# Patient Record
Sex: Female | Born: 1955 | Race: Black or African American | Hispanic: No | Marital: Single | Smoking: Never smoker
Health system: Southern US, Community
[De-identification: ages and names within clinical notes are randomized; demographics above are authoritative.]

## PROBLEM LIST (undated history)

## (undated) DIAGNOSIS — I809 Phlebitis and thrombophlebitis of unspecified site: Secondary | ICD-10-CM

## (undated) DIAGNOSIS — R519 Headache, unspecified: Secondary | ICD-10-CM

## (undated) DIAGNOSIS — I25119 Atherosclerotic heart disease of native coronary artery with unspecified angina pectoris: Secondary | ICD-10-CM

## (undated) DIAGNOSIS — C55 Malignant neoplasm of uterus, part unspecified: Secondary | ICD-10-CM

## (undated) DIAGNOSIS — I2699 Other pulmonary embolism without acute cor pulmonale: Secondary | ICD-10-CM

## (undated) DIAGNOSIS — I700 Atherosclerosis of aorta: Secondary | ICD-10-CM

## (undated) DIAGNOSIS — Z923 Personal history of irradiation: Secondary | ICD-10-CM

## (undated) DIAGNOSIS — R238 Other skin changes: Secondary | ICD-10-CM

## (undated) DIAGNOSIS — D638 Anemia in other chronic diseases classified elsewhere: Secondary | ICD-10-CM

## (undated) DIAGNOSIS — M7989 Other specified soft tissue disorders: Secondary | ICD-10-CM

## (undated) DIAGNOSIS — R0989 Other specified symptoms and signs involving the circulatory and respiratory systems: Secondary | ICD-10-CM

## (undated) DIAGNOSIS — R2243 Localized swelling, mass and lump, lower limb, bilateral: Secondary | ICD-10-CM

## (undated) DIAGNOSIS — R739 Hyperglycemia, unspecified: Secondary | ICD-10-CM

## (undated) DIAGNOSIS — R51 Headache: Secondary | ICD-10-CM

## (undated) DIAGNOSIS — C801 Malignant (primary) neoplasm, unspecified: Secondary | ICD-10-CM

## (undated) DIAGNOSIS — C7800 Secondary malignant neoplasm of unspecified lung: Secondary | ICD-10-CM

## (undated) DIAGNOSIS — Z6834 Body mass index (BMI) 34.0-34.9, adult: Secondary | ICD-10-CM

## (undated) DIAGNOSIS — D6869 Other thrombophilia: Secondary | ICD-10-CM

## (undated) HISTORY — DX: Secondary malignant neoplasm of unspecified lung: C78.00

## (undated) HISTORY — DX: Malignant neoplasm of uterus, part unspecified: C55

## (undated) HISTORY — DX: Other pulmonary embolism without acute cor pulmonale: I26.99

## (undated) HISTORY — DX: Atherosclerosis of aorta: I70.0

## (undated) HISTORY — DX: Body mass index (BMI) 34.0-34.9, adult: Z68.34

## (undated) HISTORY — DX: Other specified symptoms and signs involving the circulatory and respiratory systems: R09.89

## (undated) HISTORY — DX: Morbid (severe) obesity due to excess calories: E66.01

## (undated) HISTORY — DX: Other specified soft tissue disorders: M79.89

## (undated) HISTORY — DX: Other skin changes: R23.8

## (undated) HISTORY — DX: Anemia in other chronic diseases classified elsewhere: D63.8

## (undated) HISTORY — DX: Atherosclerotic heart disease of native coronary artery with unspecified angina pectoris: I25.119

## (undated) HISTORY — DX: Personal history of irradiation: Z92.3

## (undated) HISTORY — DX: Hyperglycemia, unspecified: R73.9

## (undated) HISTORY — DX: Other thrombophilia: D68.69

## (undated) HISTORY — PX: NO PAST SURGERIES: SHX2092

---

## 2016-10-15 ENCOUNTER — Encounter: Payer: Self-pay | Admitting: Gynecologic Oncology

## 2016-10-19 DIAGNOSIS — C541 Malignant neoplasm of endometrium: Secondary | ICD-10-CM | POA: Insufficient documentation

## 2016-11-02 NOTE — Progress Notes (Signed)
Consult Note: Gyn-Onc  Consult was requested by Dr. Alfonse Spruce for the evaluation of Brianna Herring 60 y.o. female  CC:  Chief Complaint  Patient presents with  . Endometrial adenocarcinoma    Assessment/Plan:  [name]  is a 60 y.o.  year old with high grade endometrial cancer.  We will obtain CT imaging to evaluate for gross extrauterine disease.  A detailed discussion was held with the patient and her family with regard to to her endometrial cancer diagnosis. We discussed the standard management options for uterine cancer which includes surgery followed possibly by adjuvant therapy depending on the results of surgery. The options for surgical management include a hysterectomy and removal of the tubes and ovaries possibly with removal of pelvic and para-aortic lymph nodes.If feasible, a minimally invasive approach including a robotic hysterectomy or laparoscopic hysterectomy have benefits including shorter hospital stay, recovery time and better wound healing than with open surgery. The patient has been counseled about these surgical options and the risks of surgery in general including infection, bleeding, damage to surrounding structures (including bowel, bladder, ureters, nerves or vessels), and the postoperative risks of PE/ DVT, and lymphedema. I extensively reviewed the additional risks of robotic hysterectomy including possible need for conversion to open laparotomy.  I discussed positioning during surgery of trendelenberg and risks of minor facial swelling and care we take in preoperative positioning. I discussed that due to her obesity she is at increased risk for these complications. After counseling and consideration of her options, she desires to proceed with robotic assisted total hysterectomy with bilateral sapingo-oophorectomy and SLN biopsy. Due to her nulliparous history, she may require minilaparotomy for specimen delivery.  Due to the high grade nature of her  malignancy she will have a preoperative CT scan to evaluate for extrauterine metastases in accordance to NCCN guidelines.  She will be seen by anesthesia for preoperative clearance and discussion of postoperative pain management.  She was given the opportunity to ask questions, which were answered to her satisfaction, and she is agreement with the above mentioned plan of care.   HPI: Brianna Herring is a 60 year old G0 who is seen in consultation at the request of Dr Alfonse Spruce for high grade endometrial cancer.  She is morbidly obese with a BMI of 44kg/m2.  She developed postmenopausal bleeding in September, 2017 and was evaluated by Dr Alfonse Spruce in November, 2017 when a TVUS was performed (09/13/16) which showed a uterus measuring 6.6x4.7x3.1cm with a thickened ES of 19mm.  Due to her narrow vagina and obesity, office sampling was unsuccessful.  She was taken to the OR on 10/06/16 by Dr Alfonse Spruce for a D&C with the pathology revealing high grade endometrial cancer (grade 3).  Current Meds:  Outpatient Encounter Prescriptions as of 11/03/2016  Medication Sig  . benzonatate (TESSALON) 200 MG capsule   . calcium carbonate (CALTRATE 600) 1500 (600 Ca) MG TABS tablet Take by mouth.  . Multiple Vitamins-Minerals (CENTRUM SILVER PO) Take by mouth.   No facility-administered encounter medications on file as of 11/03/2016.     Allergy: Not on File  Social Hx:   Social History   Social History  . Marital status: Unknown    Spouse name: N/A  . Number of children: N/A  . Years of education: N/A   Occupational History  . Not on file.   Social History Main Topics  . Smoking status: Never Smoker  . Smokeless tobacco: Never Used  . Alcohol use No  . Drug use:  No  . Sexual activity: Not on file   Other Topics Concern  . Not on file   Social History Narrative  . No narrative on file    Past Surgical Hx: History reviewed. No pertinent surgical history.  Past Medical Hx: History reviewed.  No pertinent past medical history.  Past Gynecological History:  G0 No LMP recorded.  Family Hx: History reviewed. No pertinent family history.  Review of Systems:  Constitutional  Feels well,    ENT Normal appearing ears and nares bilaterally Skin/Breast  No rash, sores, jaundice, itching, dryness Cardiovascular  No chest pain, shortness of breath, or edema  Pulmonary  No cough or wheeze.  Gastro Intestinal  No nausea, vomitting, or diarrhoea. No bright red blood per rectum, no abdominal pain, change in bowel movement, or constipation.  Genito Urinary  No frequency, urgency, dysuria, + postmenopausal bleeding Musculo Skeletal  No myalgia, arthralgia, joint swelling or pain  Neurologic  No weakness, numbness, change in gait,  Psychology  No depression, anxiety, insomnia.   Vitals:  Blood pressure (!) 157/61, pulse (!) 103, temperature 97.7 F (36.5 C), temperature source Oral, resp. rate (!) 21, SpO2 99 %.  Physical Exam: WD in NAD Neck  Supple NROM, without any enlargements.  Lymph Node Survey No cervical supraclavicular or inguinal adenopathy Cardiovascular  Pulse normal rate, regularity and rhythm. S1 and S2 normal.  Lungs  Clear to auscultation bilateraly, without wheezes/crackles/rhonchi. Good air movement.  Skin  No rash/lesions/breakdown  Psychiatry  Alert and oriented to person, place, and time  Abdomen  Normoactive bowel sounds, abdomen soft, non-tender and obese without evidence of hernia.  Back No CVA tenderness Genito Urinary  Vulva/vagina: Normal external female genitalia.   No lesions. No discharge or bleeding.  Bladder/urethra:  No lesions or masses, well supported bladder  Vagina: long, narrow  Cervix: unable to visualize or palpate due to long, narrow vagina  Uterus: unable to appreciate due to long narrow vagina and obesity.  Adnexa: unable to apreciate due to obesity.   Rectal  Good tone, no masses no cul de sac nodularity.  Extremities   No bilateral cyanosis, clubbing or edema.   Donaciano [name], MD  11/03/2016, 10:03 AM

## 2016-11-03 ENCOUNTER — Ambulatory Visit: Payer: BLUE CROSS/BLUE SHIELD | Attending: Gynecologic Oncology | Admitting: Gynecologic Oncology

## 2016-11-03 ENCOUNTER — Telehealth: Payer: Self-pay | Admitting: Gynecologic Oncology

## 2016-11-03 ENCOUNTER — Other Ambulatory Visit (HOSPITAL_BASED_OUTPATIENT_CLINIC_OR_DEPARTMENT_OTHER): Payer: BLUE CROSS/BLUE SHIELD

## 2016-11-03 ENCOUNTER — Encounter: Payer: Self-pay | Admitting: Gynecologic Oncology

## 2016-11-03 VITALS — BP 157/61 | HR 103 | Temp 97.7°F | Resp 21 | Ht 66.0 in | Wt 257.3 lb

## 2016-11-03 DIAGNOSIS — C541 Malignant neoplasm of endometrium: Secondary | ICD-10-CM | POA: Diagnosis not present

## 2016-11-03 DIAGNOSIS — Z6841 Body Mass Index (BMI) 40.0 and over, adult: Secondary | ICD-10-CM | POA: Diagnosis not present

## 2016-11-03 HISTORY — DX: Malignant neoplasm of endometrium: C54.1

## 2016-11-03 LAB — COMPREHENSIVE METABOLIC PANEL
ALT: 17 U/L (ref 0–55)
ANION GAP: 9 meq/L (ref 3–11)
AST: 24 U/L (ref 5–34)
Albumin: 3.5 g/dL (ref 3.5–5.0)
Alkaline Phosphatase: 78 U/L (ref 40–150)
BILIRUBIN TOTAL: 0.62 mg/dL (ref 0.20–1.20)
BUN: 11.6 mg/dL (ref 7.0–26.0)
CALCIUM: 9.6 mg/dL (ref 8.4–10.4)
CHLORIDE: 105 meq/L (ref 98–109)
CO2: 26 meq/L (ref 22–29)
CREATININE: 0.8 mg/dL (ref 0.6–1.1)
EGFR: 85 mL/min/{1.73_m2} — AB (ref >90)
Glucose: 134 mg/dl (ref 70–140)
Potassium: 3.8 mEq/L (ref 3.5–5.1)
Sodium: 139 mEq/L (ref 136–145)
TOTAL PROTEIN: 7.8 g/dL (ref 6.4–8.3)

## 2016-11-03 NOTE — Addendum Note (Signed)
Addended by: Joylene John D on: 11/03/2016 10:14 AM   Modules accepted: Orders

## 2016-11-03 NOTE — Patient Instructions (Signed)
Preparing for your Surgery  Plan for surgery on January 11 , 2018 with Dr. Everitt Amber  Pre-operative Testing -You will receive a phone call from presurgical testing at [facility] to arrange for a pre-operative testing appointment before your surgery.  This appointment normally occurs one to two weeks before your scheduled surgery.   -Bring your insurance card, copy of an advanced directive if applicable, medication list  -At that visit, you will be asked to sign a consent for a possible blood transfusion in case a transfusion becomes necessary during surgery.  The need for a blood transfusion is rare but having consent is a necessary part of your care.     -You should not be taking blood thinners or aspirin at least ten days prior to surgery unless instructed by your surgeon.  Day Before Surgery at [place] will be asked to take in a light diet the day before surgery.  Avoid carbonated beverages.  You will be advised to have nothing to eat or drink after midnight the evening before.     Eat a light diet the day before surgery.  Examples including soups, broths,  toast, yogurt, mashed potatoes.  Things to avoid include carbonated beverages  (fizzy beverages), raw fruits and raw vegetables, or beans.    If your bowels are filled with gas, your surgeon will have difficulty  visualizing your pelvic organs which increases your surgical risks.  Your role in recovery Your role is to become active as soon as directed by your doctor, while still giving yourself time to heal.  Rest when you feel tired. You will be asked to do the following in order to speed your recovery:  - Cough and breathe deeply. This helps toclear and expand your lungs and can prevent pneumonia. You may be given a spirometer to practice deep breathing. A staff member will show you how to use the spirometer. - Do mild physical activity. Walking or moving your legs help your circulation and body functions  return to normal. A staff member will help you when you try to walk and will provide you with simple exercises. Do not try to get up or walk alone the first time. - Actively manage your pain. Managing your pain lets you move in comfort. We will ask you to rate your pain on a scale of zero to 10. It is your responsibility to tell your doctor or nurse where and how much you hurt so your pain can be treated.  Special Considerations -If you are diabetic, you may be placed on insulin after surgery to have closer control over your blood sugars to promote healing and recovery.  This does not mean that you will be discharged on insulin.  If applicable, your oral antidiabetics will be resumed when you are tolerating a solid diet.  -Your final pathology results from surgery should be available by the Friday after surgery and the results will be relayed to you when available.

## 2016-11-03 NOTE — Telephone Encounter (Signed)
Called to perform peer to peer for CT chest but insurance representative stating the CT CAP has already been approved.  Auth # [id number] valid Dec 27-Jan 25.

## 2016-11-11 NOTE — Patient Instructions (Signed)
Brianna Herring  11/11/2016   Your procedure is scheduled on: THURSDAY 11-18-16  Report [facility]  Entrance take [facility]  elevators to 3rd floor to  [name] at 1115 AM.  Call this number if you have problems the morning of surgery [phone]   Remember: ONLY 1 PERSON MAY GO WITH YOU TO SHORT STAY TO GET  READY MORNING OF [name].  Do not eat food  NIGHT BEFORE SURGERY :After Midnight, MAY HAVE CLEAR LIQUIDS FROM MIDNIGHT NIGHT BEFORE SURGERY UNTIL 715 AM DAY OF SURGERY, NOTHING BY MOUTH AFTER 715 AM DAY OF SURGERY. EAT A LIGHT DIET DAY BEFORE SURGERY 11-17-16 SEE INSTRUCTIONS BELOW.     Take these medicines the morning of surgery with A SIP OF WATER: NONE                               You may not have any metal on your body including hair pins and              piercings  Do not wear jewelry, make-up, lotions, powders or perfumes, deodorant             Do not wear nail polish.  Do not shave  48 hours prior to surgery.              Men may shave face and neck.   Do not bring valuables to the hospital. [place].  Contacts, dentures or bridgework may not be worn into surgery.  Leave suitcase in the car. After surgery it may be brought to your room.                  Please read over the following fact sheets you were given: _____________________________________________________________________             Eat a light diet the day before surgery.  Examples including soups, broths, toast, yogurt, mashed potatoes.  Things to avoid include carbonated beverages (fizzy beverages), raw fruits and raw vegetables, or beans.   If your bowels are filled with gas, your surgeon will have difficulty visualizing your pelvic organs which increases your surgical risks.   CLEAR LIQUID DIET   Foods Allowed                                                                     Foods Excluded  Coffee and tea,  regular and decaf                             liquids that you cannot  Plain Jell-O in any flavor                                             see through such as: Fruit ices (not with fruit pulp)  milk, soups, orange juice  Iced Popsicles                                    All solid food                         Cranberry, grape and apple juices Sports drinks like Gatorade Lightly seasoned clear broth or consume(fat free) Sugar, honey syrup  Sample Menu Breakfast                                Lunch                                     Supper Cranberry juice                    Beef broth                            Chicken broth Jell-O                                     Grape juice                           Apple juice Coffee or tea                        Jell-O                                      Popsicle                                                Coffee or tea                        Coffee or tea  _____________________________________________________________________  [facility] - Preparing for Surgery Before surgery, you can play an important role.  Because skin is not sterile, your skin needs to be as free of germs as possible.  You can reduce the number of germs on your skin by washing with CHG (chlorahexidine gluconate) soap before surgery.  CHG is an antiseptic cleaner which kills germs and bonds with the skin to continue killing germs even after washing. Please DO NOT use if you have an allergy to CHG or antibacterial soaps.  If your skin becomes reddened/irritated stop using the CHG and inform your nurse when you arrive at Short Stay. Do not shave (including legs and underarms) for at least 48 hours prior to the first CHG shower.  You may shave your face/neck. Please follow these instructions carefully:  1.  Shower with CHG Soap the night before surgery and the  morning of Surgery.  2.  If you choose to wash your hair, wash your hair first as  usual with your  normal  shampoo.  3.  After you shampoo, rinse your hair and body thoroughly to remove the  shampoo.                           4.  Use CHG as you would any other liquid soap.  You can apply chg directly  to the skin and wash                       Gently with a scrungie or clean washcloth.  5.  Apply the CHG Soap to your body ONLY FROM THE NECK DOWN.   Do not use on face/ open                           Wound or open sores. Avoid contact with eyes, ears mouth and genitals (private parts).                       Wash face,  Genitals (private parts) with your normal soap.             6.  Wash thoroughly, paying special attention to the area where your surgery  will be performed.  7.  Thoroughly rinse your body with warm water from the neck down.  8.  DO NOT shower/wash with your normal soap after using and rinsing off  the CHG Soap.                9.  Pat yourself dry with a clean towel.            10.  Wear clean pajamas.            11.  Place clean sheets on your bed the night of your first shower and do not  sleep with pets. Day of Surgery : Do not apply any lotions/deodorants the morning of surgery.  Please wear clean clothes to the hospital/surgery center.  FAILURE TO FOLLOW THESE INSTRUCTIONS MAY RESULT IN THE CANCELLATION OF YOUR SURGERY PATIENT SIGNATURE_________________________________  NURSE SIGNATURE__________________________________  ________________________________________________________________________   [name]  An incentive spirometer is a tool that can help keep your lungs clear and active. This tool measures how well you are filling your lungs with each breath. Taking long deep breaths may help reverse or decrease the chance of developing breathing (pulmonary) problems (especially infection) following:  A long period of time when you are unable to move or be active. BEFORE THE PROCEDURE   If the spirometer includes an indicator to show your  best effort, your nurse or respiratory therapist will set it to a desired goal.  If possible, sit up straight or lean slightly forward. Try not to slouch.  Hold the incentive spirometer in an upright position. INSTRUCTIONS FOR USE  1. Sit on the edge of your bed if possible, or sit up as far as you can in bed or on a chair. 2. Hold the incentive spirometer in an upright position. 3. Breathe out normally. 4. Place the mouthpiece in your mouth and seal your lips tightly around it. 5. Breathe in slowly and as deeply as possible, raising the piston or the ball toward the top of the column. 6. Hold your breath for 3-5 seconds or for as long as possible. Allow the piston or ball to fall to the bottom of the column. 7. Remove the mouthpiece from your mouth and breathe out normally. 8. Rest  for a few seconds and repeat Steps 1 through 7 at least 10 times every 1-2 hours when you are awake. Take your time and take a few normal breaths between deep breaths. 9. The spirometer may include an indicator to show your best effort. Use the indicator as a goal to work toward during each repetition. 10. After each set of 10 deep breaths, practice coughing to be sure your lungs are clear. If you have an incision (the cut made at the time of surgery), support your incision when coughing by placing a pillow or rolled up towels firmly against it. Once you are able to get out of bed, walk around indoors and cough well. You may stop using the incentive spirometer when instructed by your caregiver.  RISKS AND COMPLICATIONS  Take your time so you do not get dizzy or light-headed.  If you are in pain, you may need to take or ask for pain medication before doing incentive spirometry. It is harder to take a deep breath if you are having pain. AFTER USE  Rest and breathe slowly and easily.  It can be helpful to keep track of a log of your progress. Your caregiver can provide you with a simple table to help with this. If  you are using the spirometer at home, follow these instructions: [place] IF:   You are having difficultly using the spirometer.  You have trouble using the spirometer as often as instructed.  Your pain medication is not giving enough relief while using the spirometer.  You develop fever of 100.5 F (38.1 C) or higher. SEEK IMMEDIATE MEDICAL CARE IF:   You cough up bloody sputum that had not been present before.  You develop fever of 102 F (38.9 C) or greater.  You develop worsening pain at or near the incision site. MAKE SURE YOU:   Understand these instructions.  Will watch your condition.  Will get help right away if you are not doing well or get worse. Document Released: 03/07/2007 Document Revised: 01/17/2012 Document Reviewed: 05/08/2007 ExitCare Patient Information 2014 Exit[employer], [place].   ________________________________________________________________________  WHAT IS A BLOOD TRANSFUSION? Blood Transfusion Information  A transfusion is the replacement of blood or some of its parts. Blood is made up of multiple cells which provide different functions.  Red blood cells carry oxygen and are used for blood loss replacement.  White blood cells fight against infection.  Platelets control bleeding.  Plasma helps clot blood.  Other blood products are available for specialized needs, such as hemophilia or other clotting disorders. BEFORE THE TRANSFUSION  Who gives blood for transfusions?   Healthy volunteers who are fully evaluated to make sure their blood is safe. This is blood bank blood. Transfusion therapy is the safest it has ever been in the practice of medicine. Before blood is taken from a donor, a complete history is taken to make sure that person has no history of diseases nor engages in risky social behavior (examples are intravenous drug use or sexual activity with multiple partners). The donor's travel history is screened to minimize risk of  transmitting infections, such as malaria. The donated blood is tested for signs of infectious diseases, such as HIV and hepatitis. The blood is then tested to be sure it is compatible with you in order to minimize the chance of a transfusion reaction. If you or a relative donates blood, this is often done in anticipation of surgery and is not appropriate for emergency situations. It takes many  days to process the donated blood. RISKS AND COMPLICATIONS Although transfusion therapy is very safe and saves many lives, the main dangers of transfusion include:   Getting an infectious disease.  Developing a transfusion reaction. This is an allergic reaction to something in the blood you were given. Every precaution is taken to prevent this. The decision to have a blood transfusion has been considered carefully by your caregiver before blood is given. Blood is not given unless the benefits outweigh the risks. AFTER THE TRANSFUSION  Right after receiving a blood transfusion, you will usually feel much better and more energetic. This is especially true if your red blood cells have gotten low (anemic). The transfusion raises the level of the red blood cells which carry oxygen, and this usually causes an energy increase.  The nurse administering the transfusion will monitor you carefully for complications. HOME CARE INSTRUCTIONS  No special instructions are needed after a transfusion. You may find your energy is better. Speak with your caregiver about any limitations on activity for underlying diseases you may have. SEEK MEDICAL CARE IF:   Your condition is not improving after your transfusion.  You develop redness or irritation at the intravenous (IV) site. SEEK IMMEDIATE MEDICAL CARE IF:  Any of the following symptoms occur over the next 12 hours:  Shaking chills.  You have a temperature by mouth above 102 F (38.9 C), not controlled by medicine.  Chest, back, or muscle pain.  People around you  feel you are not acting correctly or are confused.  Shortness of breath or difficulty breathing.  Dizziness and fainting.  You get a rash or develop hives.  You have a decrease in urine output.  Your urine turns a dark color or changes to pink, red, or brown. Any of the following symptoms occur over the next 10 days:  You have a temperature by mouth above 102 F (38.9 C), not controlled by medicine.  Shortness of breath.  Weakness after normal activity.  The white part of the eye turns yellow (jaundice).  You have a decrease in the amount of urine or are urinating less often.  Your urine turns a dark color or changes to pink, red, or brown. Document Released: 10/22/2000 Document Revised: 01/17/2012 Document Reviewed: 06/10/2008 [facility] Patient Information 2014 [address], [place].  _______________________________________________________________________

## 2016-11-15 ENCOUNTER — Ambulatory Visit (HOSPITAL_COMMUNITY)
Admission: RE | Admit: 2016-11-15 | Discharge: 2016-11-15 | Disposition: A | Discharge status: Home / Self Care | Payer: BLUE CROSS/BLUE SHIELD | Source: Ambulatory Visit | Attending: Gynecologic Oncology | Admitting: Gynecologic Oncology

## 2016-11-15 ENCOUNTER — Encounter (HOSPITAL_COMMUNITY)
Admission: RE | Admit: 2016-11-15 | Discharge: 2016-11-15 | Disposition: A | Discharge status: Home / Self Care | Payer: BLUE CROSS/BLUE SHIELD | Source: Ambulatory Visit | Attending: Gynecologic Oncology | Admitting: Gynecologic Oncology

## 2016-11-15 ENCOUNTER — Encounter (HOSPITAL_COMMUNITY): Payer: Self-pay | Admitting: Radiology

## 2016-11-15 DIAGNOSIS — R918 Other nonspecific abnormal finding of lung field: Secondary | ICD-10-CM | POA: Diagnosis not present

## 2016-11-15 DIAGNOSIS — Z01812 Encounter for preprocedural laboratory examination: Secondary | ICD-10-CM | POA: Insufficient documentation

## 2016-11-15 DIAGNOSIS — Z01818 Encounter for other preprocedural examination: Secondary | ICD-10-CM | POA: Insufficient documentation

## 2016-11-15 DIAGNOSIS — C541 Malignant neoplasm of endometrium: Secondary | ICD-10-CM | POA: Diagnosis not present

## 2016-11-15 HISTORY — DX: Phlebitis and thrombophlebitis of unspecified site: I80.9

## 2016-11-15 HISTORY — DX: Localized swelling, mass and lump, lower limb, bilateral: R22.43

## 2016-11-15 HISTORY — DX: Headache, unspecified: R51.9

## 2016-11-15 HISTORY — DX: Malignant (primary) neoplasm, unspecified: C80.1

## 2016-11-15 HISTORY — DX: Headache: R51

## 2016-11-15 LAB — URINALYSIS, ROUTINE W REFLEX MICROSCOPIC
Bilirubin Urine: NEGATIVE
Glucose, UA: NEGATIVE mg/dL
HGB URINE DIPSTICK: NEGATIVE
Ketones, ur: NEGATIVE mg/dL
Leukocytes, UA: NEGATIVE
Nitrite: NEGATIVE
PH: 8 (ref 5.0–8.0)
Protein, ur: NEGATIVE mg/dL
SPECIFIC GRAVITY, URINE: 1.026 (ref 1.005–1.030)

## 2016-11-15 LAB — CBC WITH DIFFERENTIAL/PLATELET
Basophils Absolute: 0 10*3/uL (ref 0.0–0.1)
Basophils Relative: 0 %
EOS PCT: 3 %
Eosinophils Absolute: 0.2 10*3/uL (ref 0.0–0.7)
HCT: 38.2 % (ref 36.0–46.0)
HEMOGLOBIN: 12.7 g/dL (ref 12.0–15.0)
LYMPHS ABS: 2.3 10*3/uL (ref 0.7–4.0)
LYMPHS PCT: 32 %
MCH: 30.8 pg (ref 26.0–34.0)
MCHC: 33.2 g/dL (ref 30.0–36.0)
MCV: 92.5 fL (ref 78.0–100.0)
MONOS PCT: 7 %
Monocytes Absolute: 0.5 10*3/uL (ref 0.1–1.0)
NEUTROS PCT: 58 %
Neutro Abs: 4.3 10*3/uL (ref 1.7–7.7)
Platelets: 257 10*3/uL (ref 150–400)
RBC: 4.13 MIL/uL (ref 3.87–5.11)
RDW: 12.8 % (ref 11.5–15.5)
WBC: 7.3 10*3/uL (ref 4.0–10.5)

## 2016-11-15 LAB — ABO/RH: ABO/RH(D): O POS

## 2016-11-15 MED ORDER — IOPAMIDOL (ISOVUE-300) INJECTION 61%
30.0000 mL | Freq: Once | INTRAVENOUS | Status: AC | PRN
  Administered 2016-11-15: 30 mL via ORAL

## 2016-11-15 MED ORDER — IOPAMIDOL (ISOVUE-300) INJECTION 61%
INTRAVENOUS | Status: AC
  Administered 2016-11-15: 100 mL
  Filled 2016-11-15: qty 75

## 2016-11-15 MED ORDER — IOPAMIDOL (ISOVUE-300) INJECTION 61%
75.0000 mL | Freq: Once | INTRAVENOUS | Status: AC | PRN
  Administered 2016-11-15: 75 mL via INTRAVENOUS

## 2016-11-17 NOTE — Progress Notes (Signed)
Left message on home number . [place] short stay per [place] . NPO after midnight. No answer cell number.

## 2016-11-17 NOTE — Progress Notes (Signed)
left message on home number arrive 930 am [place] short stay npo after midnight. No answer cell number. Left message to return my call.

## 2016-11-18 ENCOUNTER — Encounter (HOSPITAL_COMMUNITY)
Admission: RE | Disposition: A | Discharge status: Home / Self Care | Payer: Self-pay | Source: Ambulatory Visit | Attending: Gynecologic Oncology

## 2016-11-18 ENCOUNTER — Encounter (HOSPITAL_COMMUNITY): Payer: Self-pay | Admitting: *Deleted

## 2016-11-18 ENCOUNTER — Ambulatory Visit (HOSPITAL_COMMUNITY)
Admission: RE | Admit: 2016-11-18 | Discharge: 2016-11-19 | Disposition: A | Discharge status: Home / Self Care | Payer: BLUE CROSS/BLUE SHIELD | Source: Ambulatory Visit | Attending: Gynecologic Oncology | Admitting: Gynecologic Oncology

## 2016-11-18 ENCOUNTER — Ambulatory Visit (HOSPITAL_COMMUNITY): Payer: BLUE CROSS/BLUE SHIELD | Admitting: Anesthesiology

## 2016-11-18 DIAGNOSIS — Z6841 Body Mass Index (BMI) 40.0 and over, adult: Secondary | ICD-10-CM | POA: Insufficient documentation

## 2016-11-18 DIAGNOSIS — R05 Cough: Secondary | ICD-10-CM

## 2016-11-18 DIAGNOSIS — Q625 Duplication of ureter: Secondary | ICD-10-CM | POA: Insufficient documentation

## 2016-11-18 DIAGNOSIS — R059 Cough, unspecified: Secondary | ICD-10-CM

## 2016-11-18 DIAGNOSIS — C541 Malignant neoplasm of endometrium: Secondary | ICD-10-CM | POA: Diagnosis not present

## 2016-11-18 HISTORY — PX: SENTINEL NODE BIOPSY: SHX6608

## 2016-11-18 HISTORY — PX: ROBOTIC ASSISTED TOTAL HYSTERECTOMY WITH BILATERAL SALPINGO OOPHERECTOMY: SHX6086

## 2016-11-18 LAB — GLUCOSE, CAPILLARY: GLUCOSE-CAPILLARY: 135 mg/dL — AB (ref 65–99)

## 2016-11-18 LAB — TYPE AND SCREEN
ABO/RH(D): O POS
Antibody Screen: NEGATIVE

## 2016-11-18 SURGERY — HYSTERECTOMY, TOTAL, ROBOT-ASSISTED, LAPAROSCOPIC, WITH BILATERAL SALPINGO-OOPHORECTOMY
Anesthesia: General

## 2016-11-18 MED ORDER — FENTANYL CITRATE (PF) 100 MCG/2ML IJ SOLN
25.0000 ug | INTRAMUSCULAR | Status: DC | PRN
  Administered 2016-11-18 (×2): 25 ug via INTRAVENOUS
  Administered 2016-11-18: 50 ug via INTRAVENOUS

## 2016-11-18 MED ORDER — LACTATED RINGERS IV SOLN
INTRAVENOUS | Status: DC | PRN
Start: 2016-11-18 — End: 2016-11-18
  Administered 2016-11-18: 1000 mL

## 2016-11-18 MED ORDER — SUGAMMADEX SODIUM 500 MG/5ML IV SOLN
INTRAVENOUS | Status: AC
  Filled 2016-11-18: qty 5

## 2016-11-18 MED ORDER — PHENYLEPHRINE 40 MCG/ML (10ML) SYRINGE FOR IV PUSH (FOR BLOOD PRESSURE SUPPORT)
PREFILLED_SYRINGE | INTRAVENOUS | Status: AC
  Filled 2016-11-18: qty 10

## 2016-11-18 MED ORDER — FENTANYL CITRATE (PF) 100 MCG/2ML IJ SOLN
INTRAMUSCULAR | Status: AC
  Filled 2016-11-18: qty 2

## 2016-11-18 MED ORDER — STERILE WATER FOR IRRIGATION IR SOLN
Status: DC | PRN
  Administered 2016-11-18: 1000 mL

## 2016-11-18 MED ORDER — FENTANYL CITRATE (PF) 250 MCG/5ML IJ SOLN
INTRAMUSCULAR | Status: AC
  Filled 2016-11-18: qty 5

## 2016-11-18 MED ORDER — ENOXAPARIN SODIUM 40 MG/0.4ML ~~LOC~~ SOLN
40.0000 mg | SUBCUTANEOUS | Status: AC
  Administered 2016-11-18: 40 mg via SUBCUTANEOUS
  Filled 2016-11-18: qty 0.4

## 2016-11-18 MED ORDER — DEXMEDETOMIDINE HCL IN NACL 200 MCG/50ML IV SOLN
INTRAVENOUS | Status: AC
  Filled 2016-11-18: qty 50

## 2016-11-18 MED ORDER — CEFAZOLIN SODIUM-DEXTROSE 2-4 GM/100ML-% IV SOLN
INTRAVENOUS | Status: AC
  Filled 2016-11-18: qty 100

## 2016-11-18 MED ORDER — CEFAZOLIN SODIUM-DEXTROSE 2-4 GM/100ML-% IV SOLN: 2.0000 g | INTRAVENOUS | Status: DC

## 2016-11-18 MED ORDER — PROPOFOL 10 MG/ML IV BOLUS
INTRAVENOUS | Status: DC | PRN
  Administered 2016-11-18: 200 mg via INTRAVENOUS

## 2016-11-18 MED ORDER — ONDANSETRON HCL 4 MG PO TABS
4.0000 mg | ORAL_TABLET | Freq: Four times a day (QID) | ORAL | Status: DC | PRN
  Administered 2016-11-19: 4 mg via ORAL
  Filled 2016-11-18: qty 1

## 2016-11-18 MED ORDER — CEFAZOLIN SODIUM-DEXTROSE 2-3 GM-% IV SOLR
INTRAVENOUS | Status: DC | PRN
  Administered 2016-11-18: 2 g via INTRAVENOUS

## 2016-11-18 MED ORDER — ONDANSETRON HCL 4 MG/2ML IJ SOLN: 4.0000 mg | Freq: Once | INTRAMUSCULAR | Status: DC | PRN

## 2016-11-18 MED ORDER — MIDAZOLAM HCL 5 MG/5ML IJ SOLN
INTRAMUSCULAR | Status: DC | PRN
  Administered 2016-11-18: 2 mg via INTRAVENOUS

## 2016-11-18 MED ORDER — HYDROMORPHONE HCL 2 MG/ML IJ SOLN: 0.2000 mg | INTRAMUSCULAR | Status: DC | PRN

## 2016-11-18 MED ORDER — MIDAZOLAM HCL 2 MG/2ML IJ SOLN
INTRAMUSCULAR | Status: AC
  Filled 2016-11-18: qty 2

## 2016-11-18 MED ORDER — SUGAMMADEX SODIUM 200 MG/2ML IV SOLN
INTRAVENOUS | Status: AC
  Filled 2016-11-18: qty 4

## 2016-11-18 MED ORDER — LACTATED RINGERS IV SOLN
INTRAVENOUS | Status: DC
  Administered 2016-11-18 (×2): via INTRAVENOUS

## 2016-11-18 MED ORDER — OXYCODONE HCL 5 MG/5ML PO SOLN
5.0000 mg | Freq: Once | ORAL | Status: DC | PRN
  Filled 2016-11-18: qty 5

## 2016-11-18 MED ORDER — STERILE WATER FOR INJECTION IJ SOLN
INTRAMUSCULAR | Status: AC
  Filled 2016-11-18: qty 10

## 2016-11-18 MED ORDER — FENTANYL CITRATE (PF) 100 MCG/2ML IJ SOLN
INTRAMUSCULAR | Status: DC | PRN
  Administered 2016-11-18 (×3): 50 ug via INTRAVENOUS
  Administered 2016-11-18: 100 ug via INTRAVENOUS
  Administered 2016-11-18 (×2): 50 ug via INTRAVENOUS

## 2016-11-18 MED ORDER — ENOXAPARIN SODIUM 40 MG/0.4ML ~~LOC~~ SOLN
40.0000 mg | SUBCUTANEOUS | Status: DC
  Administered 2016-11-19: 40 mg via SUBCUTANEOUS
  Filled 2016-11-18: qty 0.4

## 2016-11-18 MED ORDER — IBUPROFEN 800 MG PO TABS
800.0000 mg | ORAL_TABLET | Freq: Three times a day (TID) | ORAL | Status: DC | PRN
  Filled 2016-11-18: qty 1

## 2016-11-18 MED ORDER — GABAPENTIN 300 MG PO CAPS
600.0000 mg | ORAL_CAPSULE | Freq: Every day | ORAL | Status: AC
  Administered 2016-11-18: 600 mg via ORAL
  Filled 2016-11-18: qty 2

## 2016-11-18 MED ORDER — ONDANSETRON HCL 4 MG/2ML IJ SOLN
INTRAMUSCULAR | Status: DC | PRN
  Administered 2016-11-18: 4 mg via INTRAVENOUS

## 2016-11-18 MED ORDER — LIDOCAINE 2% (20 MG/ML) 5 ML SYRINGE
INTRAMUSCULAR | Status: DC | PRN
  Administered 2016-11-18: 60 mg via INTRAVENOUS

## 2016-11-18 MED ORDER — KCL IN DEXTROSE-NACL 20-5-0.45 MEQ/L-%-% IV SOLN
INTRAVENOUS | Status: DC
  Administered 2016-11-18: 22:00:00 via INTRAVENOUS
  Filled 2016-11-18: qty 1000

## 2016-11-18 MED ORDER — ROCURONIUM BROMIDE 10 MG/ML (PF) SYRINGE
PREFILLED_SYRINGE | INTRAVENOUS | Status: DC | PRN
  Administered 2016-11-18 (×2): 10 mg via INTRAVENOUS
  Administered 2016-11-18: 50 mg via INTRAVENOUS

## 2016-11-18 MED ORDER — ROCURONIUM BROMIDE 50 MG/5ML IV SOSY
PREFILLED_SYRINGE | INTRAVENOUS | Status: AC
  Filled 2016-11-18: qty 25

## 2016-11-18 MED ORDER — ONDANSETRON HCL 4 MG/2ML IJ SOLN
4.0000 mg | Freq: Four times a day (QID) | INTRAMUSCULAR | Status: DC | PRN
  Administered 2016-11-18: 4 mg via INTRAVENOUS
  Filled 2016-11-18: qty 2

## 2016-11-18 MED ORDER — KETOROLAC TROMETHAMINE 15 MG/ML IJ SOLN
15.0000 mg | Freq: Four times a day (QID) | INTRAMUSCULAR | Status: AC
  Administered 2016-11-18 – 2016-11-19 (×4): 15 mg via INTRAVENOUS
  Filled 2016-11-18 (×4): qty 1

## 2016-11-18 MED ORDER — OXYCODONE HCL 5 MG PO TABS: 5.0000 mg | ORAL_TABLET | Freq: Once | ORAL | Status: DC | PRN

## 2016-11-18 MED ORDER — SUGAMMADEX SODIUM 200 MG/2ML IV SOLN
INTRAVENOUS | Status: DC | PRN
  Administered 2016-11-18: 500 mg via INTRAVENOUS

## 2016-11-18 MED ORDER — SUCCINYLCHOLINE CHLORIDE 20 MG/ML IJ SOLN
INTRAMUSCULAR | Status: DC | PRN
  Administered 2016-11-18: 120 mg via INTRAVENOUS

## 2016-11-18 MED ORDER — OXYCODONE-ACETAMINOPHEN 5-325 MG PO TABS: 1.0000 | ORAL_TABLET | ORAL | Status: DC | PRN

## 2016-11-18 MED ORDER — STERILE WATER FOR INJECTION IJ SOLN
INTRAMUSCULAR | Status: DC | PRN
  Administered 2016-11-18: 4 mL

## 2016-11-18 SURGICAL SUPPLY — 59 items
APPLICATOR SURGIFLO ENDO (HEMOSTASIS) IMPLANT
BAG LAPAROSCOPIC 12 15 PORT 16 (BASKET) IMPLANT
BAG RETRIEVAL 12/15 (BASKET)
CHLORAPREP W/TINT 26ML (MISCELLANEOUS) ×2 IMPLANT
COVER SURGICAL LIGHT HANDLE (MISCELLANEOUS) ×2 IMPLANT
COVER TIP SHEARS 8 DVNC (MISCELLANEOUS) ×2 IMPLANT
COVER TIP SHEARS 8MM DA VINCI (MISCELLANEOUS) ×2
DERMABOND ADVANCED (GAUZE/BANDAGES/DRESSINGS) ×1
DERMABOND ADVANCED .7 DNX12 (GAUZE/BANDAGES/DRESSINGS) ×1 IMPLANT
DRAPE ARM DVNC X/XI (DISPOSABLE) ×4 IMPLANT
DRAPE COLUMN DVNC XI (DISPOSABLE) ×1 IMPLANT
DRAPE DA VINCI XI ARM (DISPOSABLE) ×4
DRAPE DA VINCI XI COLUMN (DISPOSABLE) ×1
DRAPE SHEET LG 3/4 BI-LAMINATE (DRAPES) ×4 IMPLANT
DRAPE SURG IRRIG POUCH 19X23 (DRAPES) ×2 IMPLANT
ELECT REM PT RETURN 15FT ADLT (MISCELLANEOUS) ×2 IMPLANT
GLOVE BIO SURGEON STRL SZ 6 (GLOVE) ×8 IMPLANT
GLOVE BIO SURGEON STRL SZ 6.5 (GLOVE) ×4 IMPLANT
GOWN STRL REUS W/ TWL LRG LVL3 (GOWN DISPOSABLE) ×2 IMPLANT
GOWN STRL REUS W/TWL LRG LVL3 (GOWN DISPOSABLE) ×2
HOLDER FOLEY CATH W/STRAP (MISCELLANEOUS) ×2 IMPLANT
IRRIG SUCT STRYKERFLOW 2 WTIP (MISCELLANEOUS) ×2
IRRIGATION SUCT STRKRFLW 2 WTP (MISCELLANEOUS) ×1 IMPLANT
KIT BASIN OR (CUSTOM PROCEDURE TRAY) ×2 IMPLANT
KIT PROCEDURE DA VINCI SI (MISCELLANEOUS) ×1
KIT PROCEDURE DVNC SI (MISCELLANEOUS) ×1 IMPLANT
MANIPULATOR UTERINE 4.5 ZUMI (MISCELLANEOUS) ×2 IMPLANT
MARKER SKIN DUAL TIP RULER LAB (MISCELLANEOUS) ×2 IMPLANT
NDL SAFETY ECLIPSE 18X1.5 (NEEDLE) ×1 IMPLANT
NEEDLE HYPO 18GX1.5 SHARP (NEEDLE) ×1
NEEDLE SPNL 18GX3.5 QUINCKE PK (NEEDLE) ×2 IMPLANT
OBTURATOR OPTICAL STANDARD 8MM (TROCAR) ×1
OBTURATOR OPTICAL STND 8 DVNC (TROCAR) ×1
OBTURATOR OPTICALSTD 8 DVNC (TROCAR) ×1 IMPLANT
OCCLUDER COLPOPNEUMO (BALLOONS) ×2 IMPLANT
PAD POSITIONING PINK XL (MISCELLANEOUS) ×2 IMPLANT
PORT ACCESS TROCAR AIRSEAL 12 (TROCAR) ×1 IMPLANT
PORT ACCESS TROCAR AIRSEAL 5M (TROCAR) ×1
POUCH SPECIMEN RETRIEVAL 10MM (ENDOMECHANICALS) ×2 IMPLANT
SEAL CANN UNIV 5-8 DVNC XI (MISCELLANEOUS) ×4 IMPLANT
SEAL XI 5MM-8MM UNIVERSAL (MISCELLANEOUS) ×4
SET TRI-LUMEN FLTR TB AIRSEAL (TUBING) ×2 IMPLANT
SHEET LAVH (DRAPES) ×2 IMPLANT
SOLUTION ELECTROLUBE (MISCELLANEOUS) ×2 IMPLANT
SURGIFLO W/THROMBIN 8M KIT (HEMOSTASIS) IMPLANT
SUT MNCRL AB 4-0 PS2 18 (SUTURE) ×4 IMPLANT
SUT VIC AB 0 CT1 27 (SUTURE) ×1
SUT VIC AB 0 CT1 27XBRD ANTBC (SUTURE) ×1 IMPLANT
SYR 10ML LL (SYRINGE) ×2 IMPLANT
SYR 50ML LL SCALE MARK (SYRINGE) ×2 IMPLANT
TOWEL OR 17X26 10 PK STRL BLUE (TOWEL DISPOSABLE) ×4 IMPLANT
TOWEL OR NON WOVEN STRL DISP B (DISPOSABLE) ×2 IMPLANT
TRAP SPECIMEN MUCOUS 40CC (MISCELLANEOUS) IMPLANT
TRAY FOLEY W/METER SILVER 16FR (SET/KITS/TRAYS/PACK) ×2 IMPLANT
TRAY LAPAROSCOPIC (CUSTOM PROCEDURE TRAY) ×2 IMPLANT
TROCAR BLADELESS OPT 5 100 (ENDOMECHANICALS) ×2 IMPLANT
UNDERPAD 30X30 (UNDERPADS AND DIAPERS) ×2 IMPLANT
UNDERPAD 30X30 INCONTINENT (UNDERPADS AND DIAPERS) ×2 IMPLANT
WATER STERILE IRR 1500ML POUR (IV SOLUTION) ×4 IMPLANT

## 2016-11-18 NOTE — Anesthesia Procedure Notes (Signed)
Procedure Name: Intubation Performed by: Deajah Erkkila J Pre-anesthesia Checklist: Patient identified, Emergency Drugs available, Suction available, Patient being monitored and Timeout performed Patient Re-evaluated:Patient Re-evaluated prior to inductionOxygen Delivery Method: Circle system utilized Preoxygenation: Pre-oxygenation with 100% oxygen Intubation Type: IV induction Ventilation: Mask ventilation without difficulty Laryngoscope Size: Mac and 4 Grade View: Grade I Tube type: Oral Tube size: 7.0 mm Number of attempts: 1 Airway Equipment and Method: Stylet Placement Confirmation: ETT inserted through vocal cords under direct vision,  positive ETCO2,  CO2 detector and breath sounds checked- equal and bilateral Secured at: 21 cm Tube secured with: Tape Dental Injury: Teeth and Oropharynx as per pre-operative assessment        

## 2016-11-18 NOTE — Interval H&P Note (Signed)
History and Physical Interval Note:  11/18/2016 1:14 PM  Brianna Herring  has presented today for surgery, with the diagnosis of ENDOMETRIAL ADENOCARCINOMA  The various methods of treatment have been discussed with the patient and family. After consideration of risks, benefits and other options for treatment, the patient has consented to  Procedure(s): XI ROBOTIC ASSISTED TOTAL HYSTERECTOMY WITH BILATERAL SALPINGO OOPHORECTOMY (N/A) SENTINEL NODE BIOPSY (N/A) as a surgical intervention .  The patient's history has been reviewed, patient examined, no change in status, stable for surgery.  I have reviewed the patient's chart and labs.  CT scan showed no gross intraperitoneal mets, but there were RLL nodules that were concerning for metastases vs inflammatory process. Does not change plan to do hysterectomy and obtain additional staging information. Questions were answered to the patient's satisfaction.     Brianna Herring

## 2016-11-18 NOTE — Discharge Instructions (Signed)
11/18/2016  Return to work: 4 weeks  Activity: 1. Be up and out of the bed during the day.  Take a nap if needed.  You may walk up steps but be careful and use the hand rail.  Stair climbing will tire you more than you think, you may need to stop part way and rest.   2. No lifting or straining for 6 weeks.  3. No driving for 1 weeks.  Do Not drive if you are taking narcotic pain medicine.  4. Shower daily.  Use soap and water on your incision and pat dry; don't rub.   5. No sexual activity and nothing in the vagina for 8 weeks.  Diet: 1. Low sodium Heart Healthy Diet is recommended.  2. It is safe to use a laxative if you have difficulty moving your bowels.   Wound Care: 1. Keep clean and dry.  Shower daily.  Reasons to call the Doctor:   Fever - Oral temperature greater than 100.4 degrees Fahrenheit  Foul-smelling vaginal discharge  Difficulty urinating  Nausea and vomiting  Increased pain at the site of the incision that is unrelieved with pain medicine.  Difficulty breathing with or without chest pain  New calf pain especially if only on one side  Sudden, continuing increased vaginal bleeding with or without clots.   Follow-up: 1. See Everitt Amber in 2 weeks.  Contacts: For questions or concerns you should contact:  Dr. Everitt Amber at [phone]  or at [place]

## 2016-11-18 NOTE — Transfer of Care (Signed)
Immediate Anesthesia Transfer of Care Note  Patient: Brianna Herring  Procedure(s) Performed: Procedure(s): XI ROBOTIC ASSISTED TOTAL HYSTERECTOMY WITH BILATERAL SALPINGO OOPHORECTOMY (N/A) SENTINEL NODE BIOPSY (N/A)  Patient Location: PACU  Anesthesia Type:General  Level of Consciousness: sedated, patient cooperative and responds to stimulation  Airway & Oxygen Therapy: Patient Spontanous Breathing and Patient connected to face mask oxygen  Post-op Assessment: Report given to RN and Post -op Vital signs reviewed and stable  Post vital signs: Reviewed and stable  Last Vitals:  Vitals:   11/18/16 0939  BP: 130/84  Pulse: (!) 104  Resp: 18  Temp: 37 C    Last Pain:  Vitals:   11/18/16 0939  TempSrc: Oral      Patients Stated Pain Goal: 4 ([id number])  Complications: No apparent anesthesia complications

## 2016-11-18 NOTE — Op Note (Signed)
OPERATIVE NOTE 11/18/16  Surgeon: Donaciano [name]   Assistants: Dr Lahoma Crocker (an MD assistant was necessary for tissue manipulation, management of robotic instrumentation, retraction and positioning due to the complexity of the case and hospital policies).   Anesthesia: General endotracheal anesthesia  ASA Class: 3   Pre-operative Diagnosis: grade 3 endometrial cancer  Post-operative Diagnosis: same  Operation: Robotic-assisted laparoscopic total hysterectomy with bilateral salpingoophorectomy, sentinel lymph node biopsy.  Surgeon: Donaciano [name]  Assistant Surgeon: Lahoma Crocker MD  Anesthesia: GET  Urine Output: 50  Operative Findings:  : 6cm uterus, normal appearing tubes and ovaries, duplicated ureters bilaterally, extreme retroperitoneal and intraperitoneal biopsy.  Estimated Blood Loss:  less than 50 mL      Total IV Fluids: 700 ml         Specimens: uterus, cervix, bilateral tubes and ovaries, right external iliac SLN 1 (distal) and 2 (proximal), right obturator SLN, left obturator SLN.         Complications:  None; patient tolerated the procedure well.         Disposition: PACU - hemodynamically stable.  Procedure Details  The patient was seen in the Holding Room. The risks, benefits, complications, treatment options, and expected outcomes were discussed with the patient.  The patient concurred with the proposed plan, giving informed consent.  The site of surgery properly noted/marked. The patient was identified as Brianna Herring and the procedure verified as a Robotic-assisted hysterectomy with bilateral salpingo oophorectomy. A Time Out was held and the above information confirmed.  After induction of anesthesia, the patient was draped and prepped in the usual sterile manner. Pt was placed in supine position after anesthesia and draped and prepped in the usual sterile manner. The abdominal drape was placed after the CholoraPrep had been  allowed to dry for 3 minutes.  Her arms were tucked to her side with all appropriate precautions.  The shoulders were stabilized with padded shoulder blocks applied to the acromium processes.  The patient was placed in the semi-lithotomy position in [place].  The perineum was prepped with Betadine. The patient was then prepped. Foley catheter was placed.  A sterile speculum was placed in the vagina.  The cervix was grasped with a single-tooth tenaculum and dilated with Kennon Rounds dilators. 1mg  total of ICG was injected into the cervical stroma at 2 and 9 o'clock at a 61mm depth (concentration 0..5mg /ml).  The ZUMI uterine manipulator with a medium colpotomizer ring was placed without difficulty.  A pneum occluder balloon was placed over the manipulator.  OG tube placement was confirmed and to suction.   Next, a 5 mm skin incision was made 1 cm below the subcostal margin in the midclavicular line.  The 5 mm Optiview port and scope was used for direct entry.  Opening pressure was under 10 mm CO2.  The abdomen was insufflated and the findings were noted as above.   At this point and all points during the procedure, the patient's intra-abdominal pressure did not exceed 15 mmHg. Next, a 10 mm skin incision was made in the umbilicus and a right and left port was placed about 10 cm lateral to the robot port on the right and left side.  A fourth arm was placed in the left lower quadrant 2 cm above and superior and medial to the anterior superior iliac spine.  All ports were placed under direct visualization.  The patient was placed in steep Trendelenburg.  Bowel was folded away into the upper abdomen.  The robot was docked in the normal manner.  The right and left peritoneum were opened parallel to the IP ligament to open the retroperitoneal spaces bilaterally. The SLN mapping was performed in bilateral pelvic basins. The para rectal and paravesical spaces were opened up. Lymphatic channels were identified travelling  to the following visualized sentinel lymph node's: right external iliac SLN 1 (distal/deep) and external iliac SLN 2 (proximal), and right obturator SLN, left external iliac SLN. These SLN's were separated from their surrounding lymphatic tissue, removed and sent for permanent pathology.  The hysterectomy was started after the round ligament on the right side was incised and the retroperitoneum was entered and the pararectal space was developed.  The ureter was noted to be on the medial leaf of the broad ligament.  The peritoneum above the ureter was incised and stretched and the infundibulopelvic ligament was skeletonized, cauterized and cut.  The posterior peritoneum was taken down to the level of the KOH ring.  The anterior peritoneum was also taken down.  The bladder flap was created to the level of the KOH ring.  The uterine artery on the right side was skeletonized, cauterized and cut in the normal manner.  A similar procedure was performed on the left.  The colpotomy was made and the uterus, cervix, bilateral ovaries and tubes were amputated and delivered through the vagina.  Pedicles were inspected and excellent hemostasis was achieved.    The colpotomy at the vaginal cuff was closed with Vicryl on a CT1 needle in a running manner.  Irrigation was used and excellent hemostasis was achieved.  At this point in the procedure was completed.  Robotic instruments were removed under direct visulaization.  The robot was undocked. The 10 mm ports were closed with Vicryl on a UR-5 needle and the fascia was closed with 0 Vicryl on a UR-5 needle.  The skin was closed with 4-0 Vicryl in a subcuticular manner.  Dermabond was applied.  Sponge, lap and needle counts correct x 2.  The patient was taken to the recovery room in stable condition.  The vagina was swabbed with  minimal bleeding noted.   All instrument and needle counts were correct x  3.   The patient was transferred to the recovery room in a stable  condition.  Donaciano [name], MD

## 2016-11-18 NOTE — Anesthesia Postprocedure Evaluation (Signed)
Anesthesia Post Note  Patient: Brianna Herring  Procedure(Herring) Performed: Procedure(Herring) (LRB): XI ROBOTIC ASSISTED TOTAL HYSTERECTOMY WITH BILATERAL SALPINGO OOPHORECTOMY (N/A) SENTINEL NODE BIOPSY (N/A)  Patient location during evaluation: PACU Anesthesia Type: General Level of consciousness: awake and alert and patient cooperative Pain management: pain level controlled Vital Signs Assessment: post-procedure vital signs reviewed and stable Respiratory status: spontaneous breathing and respiratory function stable Cardiovascular status: stable Anesthetic complications: no       Last Vitals:  Vitals:   11/18/16 1700 11/18/16 1715  BP: (!) 158/80 (!) 161/85  Pulse: 93 95  Resp: 20 (!) 23  Temp:      Last Pain:  Vitals:   11/18/16 1715  TempSrc:   PainSc: Asleep                 Brianna Herring

## 2016-11-18 NOTE — H&P (View-Only) (Signed)
Consult Note: Gyn-Onc  Consult was requested by Dr. Alfonse Spruce for the evaluation of Brianna Herring 61 y.o. female  CC:  Chief Complaint  Patient presents with  . Endometrial adenocarcinoma    Assessment/Plan:  Ms. Brittlyn Romani  is a 61 y.o.  year old with high grade endometrial cancer.  We will obtain CT imaging to evaluate for gross extrauterine disease.  A detailed discussion was held with the patient and her family with regard to to her endometrial cancer diagnosis. We discussed the standard management options for uterine cancer which includes surgery followed possibly by adjuvant therapy depending on the results of surgery. The options for surgical management include a hysterectomy and removal of the tubes and ovaries possibly with removal of pelvic and para-aortic lymph nodes.If feasible, a minimally invasive approach including a robotic hysterectomy or laparoscopic hysterectomy have benefits including shorter hospital stay, recovery time and better wound healing than with open surgery. The patient has been counseled about these surgical options and the risks of surgery in general including infection, bleeding, damage to surrounding structures (including bowel, bladder, ureters, nerves or vessels), and the postoperative risks of PE/ DVT, and lymphedema. I extensively reviewed the additional risks of robotic hysterectomy including possible need for conversion to open laparotomy.  I discussed positioning during surgery of trendelenberg and risks of minor facial swelling and care we take in preoperative positioning. I discussed that due to her obesity she is at increased risk for these complications. After counseling and consideration of her options, she desires to proceed with robotic assisted total hysterectomy with bilateral sapingo-oophorectomy and SLN biopsy. Due to her nulliparous history, she may require minilaparotomy for specimen delivery.  Due to the high grade nature of her  malignancy she will have a preoperative CT scan to evaluate for extrauterine metastases in accordance to NCCN guidelines.  She will be seen by anesthesia for preoperative clearance and discussion of postoperative pain management.  She was given the opportunity to ask questions, which were answered to her satisfaction, and she is agreement with the above mentioned plan of care.   HPI: Brianna Herring is a 61 year old G0 who is seen in consultation at the request of Dr Alfonse Spruce for high grade endometrial cancer.  She is morbidly obese with a BMI of 44kg/m2.  She developed postmenopausal bleeding in September, 2017 and was evaluated by Dr Alfonse Spruce in November, 2017 when a TVUS was performed (09/13/16) which showed a uterus measuring 6.6x4.7x3.1cm with a thickened ES of 34mm.  Due to her narrow vagina and obesity, office sampling was unsuccessful.  She was taken to the OR on 10/06/16 by Dr Alfonse Spruce for a D&C with the pathology revealing high grade endometrial cancer (grade 3).  Current Meds:  Outpatient Encounter Prescriptions as of 11/03/2016  Medication Sig  . benzonatate (TESSALON) 200 MG capsule   . calcium carbonate (CALTRATE 600) 1500 (600 Ca) MG TABS tablet Take by mouth.  . Multiple Vitamins-Minerals (CENTRUM SILVER PO) Take by mouth.   No facility-administered encounter medications on file as of 11/03/2016.     Allergy: Not on File  Social Hx:   Social History   Social History  . Marital status: Unknown    Spouse name: N/A  . Number of children: N/A  . Years of education: N/A   Occupational History  . Not on file.   Social History Main Topics  . Smoking status: Never Smoker  . Smokeless tobacco: Never Used  . Alcohol use No  . Drug use:  No  . Sexual activity: Not on file   Other Topics Concern  . Not on file   Social History Narrative  . No narrative on file    Past Surgical Hx: History reviewed. No pertinent surgical history.  Past Medical Hx: History reviewed.  No pertinent past medical history.  Past Gynecological History:  G0 No LMP recorded.  Family Hx: History reviewed. No pertinent family history.  Review of Systems:  Constitutional  Feels well,    ENT Normal appearing ears and nares bilaterally Skin/Breast  No rash, sores, jaundice, itching, dryness Cardiovascular  No chest pain, shortness of breath, or edema  Pulmonary  No cough or wheeze.  Gastro Intestinal  No nausea, vomitting, or diarrhoea. No bright red blood per rectum, no abdominal pain, change in bowel movement, or constipation.  Genito Urinary  No frequency, urgency, dysuria, + postmenopausal bleeding Musculo Skeletal  No myalgia, arthralgia, joint swelling or pain  Neurologic  No weakness, numbness, change in gait,  Psychology  No depression, anxiety, insomnia.   Vitals:  Blood pressure (!) 157/61, pulse (!) 103, temperature 97.7 F (36.5 C), temperature source Oral, resp. rate (!) 21, SpO2 99 %.  Physical Exam: WD in NAD Neck  Supple NROM, without any enlargements.  Lymph Node Survey No cervical supraclavicular or inguinal adenopathy Cardiovascular  Pulse normal rate, regularity and rhythm. S1 and S2 normal.  Lungs  Clear to auscultation bilateraly, without wheezes/crackles/rhonchi. Good air movement.  Skin  No rash/lesions/breakdown  Psychiatry  Alert and oriented to person, place, and time  Abdomen  Normoactive bowel sounds, abdomen soft, non-tender and obese without evidence of hernia.  Back No CVA tenderness Genito Urinary  Vulva/vagina: Normal external female genitalia.   No lesions. No discharge or bleeding.  Bladder/urethra:  No lesions or masses, well supported bladder  Vagina: long, narrow  Cervix: unable to visualize or palpate due to long, narrow vagina  Uterus: unable to appreciate due to long narrow vagina and obesity.  Adnexa: unable to apreciate due to obesity.   Rectal  Good tone, no masses no cul de sac nodularity.  Extremities   No bilateral cyanosis, clubbing or edema.   Donaciano [name], MD  11/03/2016, 10:03 AM

## 2016-11-18 NOTE — Anesthesia Preprocedure Evaluation (Addendum)
Anesthesia Evaluation  Patient identified by MRN, date of birth, ID band Patient awake    Reviewed: Allergy & Precautions, NPO status , Patient's Chart, lab work & pertinent test results  Airway Mallampati: II  TM Distance: >3 FB Neck ROM: Full    Dental  (+) Teeth Intact, Dental Advisory Given   Pulmonary    breath sounds clear to auscultation       Cardiovascular  Rhythm:Regular Rate:Normal     Neuro/Psych    GI/Hepatic   Endo/Other    Renal/GU      Musculoskeletal   Abdominal (+) + obese,   Peds  Hematology   Anesthesia Other Findings   Reproductive/Obstetrics                             Anesthesia Physical Anesthesia Plan  ASA: III  Anesthesia Plan: General   Post-op Pain Management:    Induction: Intravenous  Airway Management Planned: Oral ETT  Additional Equipment:   Intra-op Plan:   Post-operative Plan: Extubation in OR  Informed Consent: I have reviewed the patients History and Physical, chart, labs and discussed the procedure including the risks, benefits and alternatives for the proposed anesthesia with the patient or authorized representative who has indicated his/her understanding and acceptance.   Dental advisory given  Plan Discussed with: CRNA and Anesthesiologist  Anesthesia Plan Comments:         Anesthesia Quick Evaluation  

## 2016-11-19 ENCOUNTER — Telehealth: Payer: Self-pay | Admitting: Gynecologic Oncology

## 2016-11-19 ENCOUNTER — Encounter (HOSPITAL_COMMUNITY): Payer: Self-pay | Admitting: Gynecologic Oncology

## 2016-11-19 DIAGNOSIS — C541 Malignant neoplasm of endometrium: Secondary | ICD-10-CM | POA: Diagnosis not present

## 2016-11-19 LAB — CBC
HCT: 36.1 % (ref 36.0–46.0)
Hemoglobin: 11.8 g/dL — ABNORMAL LOW (ref 12.0–15.0)
MCH: 30.2 pg (ref 26.0–34.0)
MCHC: 32.7 g/dL (ref 30.0–36.0)
MCV: 92.3 fL (ref 78.0–100.0)
PLATELETS: 234 10*3/uL (ref 150–400)
RBC: 3.91 MIL/uL (ref 3.87–5.11)
RDW: 12.9 % (ref 11.5–15.5)
WBC: 6.9 10*3/uL (ref 4.0–10.5)

## 2016-11-19 LAB — BASIC METABOLIC PANEL
Anion gap: 7 (ref 5–15)
BUN: 7 mg/dL (ref 6–20)
CALCIUM: 8.3 mg/dL — AB (ref 8.9–10.3)
CO2: 30 mmol/L (ref 22–32)
CREATININE: 0.8 mg/dL (ref 0.44–1.00)
Chloride: 100 mmol/L — ABNORMAL LOW (ref 101–111)
Glucose, Bld: 118 mg/dL — ABNORMAL HIGH (ref 65–99)
Potassium: 3.9 mmol/L (ref 3.5–5.1)
SODIUM: 137 mmol/L (ref 135–145)

## 2016-11-19 MED ORDER — OXYCODONE HCL 5 MG PO TABS
5.0000 mg | ORAL_TABLET | Freq: Four times a day (QID) | ORAL | 0 refills | Status: DC | PRN
Start: 2016-11-19 — End: 2018-01-18

## 2016-11-19 MED ORDER — BENZONATATE 100 MG PO CAPS
100.0000 mg | ORAL_CAPSULE | Freq: Three times a day (TID) | ORAL | Status: DC | PRN
  Administered 2016-11-19: 100 mg via ORAL
  Filled 2016-11-19: qty 1

## 2016-11-19 NOTE — Progress Notes (Signed)
Pt was given discharge instructions and all questions were answered. Pt was given prescriptions and was taken out via wheelchair to the main entrance. [place]

## 2016-11-19 NOTE — Telephone Encounter (Signed)
Attempted to call patient back at mutlple numbers, left message with infor about how to contact us afterhours. Donaciano [name], MD

## 2016-11-19 NOTE — Discharge Summary (Signed)
Physician Discharge Summary  Patient ID: Brianna Herring MRN: [id number] DOB/AGE: 1956-05-30 61 y.o.  Admit date: 11/18/2016 Discharge date: 11/19/2016  Admission Diagnoses: Endometrial adenocarcinoma [facility])  Discharge Diagnoses:  Principal Problem:   Endometrial adenocarcinoma [facility])   Discharged Condition:  The patient is in good condition and stable for discharge.    Hospital Course: On 11/18/2016, the patient underwent the following: Procedure(s): XI ROBOTIC ASSISTED TOTAL HYSTERECTOMY WITH BILATERAL SALPINGO OOPHORECTOMY SENTINEL NODE BIOPSY.   The postoperative course was uneventful.  She was discharged to home on postoperative day 1 tolerating a regular diet, voiding, pain controlled.  Consults: None  Significant Diagnostic Studies: None  Treatments: surgery: see above  Discharge Exam: Blood pressure 123/61, pulse 99, temperature 98 F (36.7 C), temperature source Oral, resp. rate 18, height 5\' 6"  (1.676 m), weight 255 lb (115.7 kg), SpO2 96 %. General appearance: alert, cooperative and no distress Resp: clear to auscultation bilaterally Cardio: regular rate and rhythm, S1, S2 normal, no murmur, click, rub or gallop GI: soft, non-tender; bowel sounds normal; no masses,  no organomegaly and abdomen obese Extremities: extremities normal, atraumatic, no cyanosis or edema Incision/Wound: Lap sites to the abdomen with dermabond without erythema or drainage  Disposition: Home  Discharge Instructions    Call MD for:  difficulty breathing, headache or visual disturbances    Complete by:  As directed    Call MD for:  extreme fatigue    Complete by:  As directed    Call MD for:  hives    Complete by:  As directed    Call MD for:  persistant dizziness or light-headedness    Complete by:  As directed    Call MD for:  persistant nausea and vomiting    Complete by:  As directed    Call MD for:  redness, tenderness, or signs of infection (pain, swelling, redness, odor or  green/yellow discharge around incision site)    Complete by:  As directed    Call MD for:  severe uncontrolled pain    Complete by:  As directed    Call MD for:  temperature >100.4    Complete by:  As directed    Diet - low sodium heart healthy    Complete by:  As directed    Driving Restrictions    Complete by:  As directed    No driving for 1 week.  Do not take narcotics and drive.   Increase activity slowly    Complete by:  As directed    Lifting restrictions    Complete by:  As directed    No lifting greater than 10 lbs.   Sexual Activity Restrictions    Complete by:  As directed    No sexual activity, nothing in the vagina, for 8 weeks.     Allergies as of 11/19/2016   No Known Allergies     Medication List    TAKE these medications   benzonatate 200 MG capsule Commonly known as:  TESSALON Take 200 mg by mouth daily as needed for cough.   CALTRATE 600 1500 (600 Ca) MG Tabs tablet Generic drug:  calcium carbonate Take 1,500 tablets by mouth 2 (two) times daily.   CENTRUM SILVER PO Take 1 tablet by mouth daily.   oxyCODONE 5 MG immediate release tablet Commonly known as:  Oxy IR/ROXICODONE Take 1-2 tablets (5-10 mg total) by mouth every 6 (six) hours as needed for severe pain.      Follow-up Information  Donaciano [name], MD In 2 weeks.   Specialty:  Obstetrics and Gynecology Contact information: [address] [place] [address] [phone]           Greater than thirty minutes were spend for face to face discharge instructions and discharge orders/summary in EPIC.   Signed: Adream Parzych DEAL 11/19/2016, 8:22 AM

## 2016-11-30 ENCOUNTER — Telehealth: Payer: Self-pay | Admitting: Gynecologic Oncology

## 2016-11-30 ENCOUNTER — Other Ambulatory Visit: Payer: Self-pay | Admitting: Gynecologic Oncology

## 2016-11-30 DIAGNOSIS — C541 Malignant neoplasm of endometrium: Secondary | ICD-10-CM

## 2016-11-30 DIAGNOSIS — R918 Other nonspecific abnormal finding of lung field: Secondary | ICD-10-CM

## 2016-11-30 NOTE — Telephone Encounter (Signed)
Attempted to call the patient to discuss final path and Dr. Serita Grit recommendations to obtain a PET scan first then plan for vaginal brachytherapy.  Will retry patient at a later date.

## 2016-12-01 ENCOUNTER — Encounter: Payer: Self-pay | Admitting: Gynecologic Oncology

## 2016-12-01 NOTE — Progress Notes (Signed)
Peer to peer performed for PET scan.  Authorization approved with Auth # [id number] good until Feb 21.  Loletta Parish with prior auth notified.

## 2016-12-06 ENCOUNTER — Telehealth: Payer: Self-pay | Admitting: Gynecologic Oncology

## 2016-12-06 NOTE — Telephone Encounter (Signed)
Called to inform the patient of Dr. Serita Grit recommendations for a PET scan.  Advised I would call her back once it had been scheduled at [facility].

## 2016-12-06 NOTE — Telephone Encounter (Signed)
Spoke with patient about the need for a PET scan.  She would like to have her PET scan at [facility]  First available PET at [facility] is Feb 7 and at [facility] is Feb 6.  Patient has been scheduled for a PET scan at [place] on Feb 7 at 6:45am with arrival time of 6:15am.  Advised to have nothing to eat or drink after midnight the night before.  Authorization number is [id number] good until Feb 21.  Patient is to follow up this Friday and the above information will be reviewed again.  Advised to call for any needs or concerns.

## 2016-12-09 NOTE — Progress Notes (Signed)
Consult Note: Gyn-Onc  Consult was requested by Dr. Alfonse Spruce for the evaluation of Brianna Herring 61 y.o. female  CC:  Chief Complaint  Patient presents with  . Endometrial Cancer    Assessment/Plan:  [name]  is a 61 y.o.  year old with stage IB high grade endometrial cancer.  High/intermediate uterine risk factors however, suspicious pulmonary nodules on preop CT scan.  PET/CT scheduled for 12/15/17. If PET avid nodules in chest, recommend adjuvant chemotherapy with 6 cycles of carboplatin and paclitaxel. She would be eligible for GOG 286B. If PET non-avid chest nodules, recommend radiation in accordance with NCCN Guidelines for stage IB endometrial cancer. She would be a candidate for whole pelvic RT in accordance with ASTRO guidelines (deep myometrial invasion of a grade 3 cancer).  MSI unstable - recommend genetics referral.  HPI: Brianna Herring is a 61 year old G0 who is seen in consultation at the request of Dr Alfonse Spruce for high grade endometrial cancer.  She is morbidly obese with a BMI of 44kg/m2.  She developed postmenopausal bleeding in September, 2017 and was evaluated by Dr Alfonse Spruce in November, 2017 when a TVUS was performed (09/13/16) which showed a uterus measuring 6.6x4.7x3.1cm with a thickened ES of 47m.  Due to her narrow vagina and obesity, office sampling was unsuccessful.  She was taken to the OR on 10/06/16 by Dr NAlfonse Sprucefor a D&C with the pathology revealing high grade endometrial cancer (grade 3).  Interval Hx: Preoperative CT scan of the chest, abdo, pelvis on 11/15/16 showed multiple pulmonary nodules suspicious for metastatic disease. These are most numerous within the right lower lobe and could be postinflammatory..  On 11/18/16 she was taken to the OR for a robotic assisted total hysterectomy, BSO, SLN biopsy.  Final pathology showed a grade 3 endometrioid endometrial adenocarcinoma with a 4cm tumor, 1.8 of 2.3cm (80%) myometrial invasion with  LVSI present. This constituted high risk features for recurrence and adjuvant radiation was recommended to reduce local recurrence risk in accordance with NCCN guidelines. 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  Postoperatively she did well with no issues.   Current Meds:  Outpatient Encounter Prescriptions as of 12/10/2016  Medication Sig  . calcium carbonate (CALTRATE 600) 1500 (600 Ca) MG TABS tablet Take 1,500 tablets by mouth 2 (two) times daily.   . Multiple Vitamins-Minerals (CENTRUM SILVER PO) Take 1 tablet by mouth daily.   .Marland KitchenoxyCODONE (OXY IR/ROXICODONE) 5 MG immediate release tablet Take 1-2 tablets (5-10 mg total) by mouth every 6 (six) hours as needed for severe pain.  . [DISCONTINUED] benzonatate (TESSALON) 200 MG capsule Take 200 mg by mouth daily as needed for cough.    No facility-administered encounter medications on file as of 12/10/2016.     Allergy: No Known Allergies  Social Hx:   Social History   Social History  . Marital status: Single    Spouse name: N/A  . Number of children: N/A  . Years of education: N/A   Occupational History  . Not on file.   Social History Main Topics  . Smoking status: Never Smoker  . Smokeless tobacco: Never Used  . Alcohol use No  . Drug use: No  . Sexual activity: No   Other Topics Concern  . Not on file   Social History Narrative  . No narrative on file    Past Surgical Hx:  Past Surgical History:  Procedure Laterality Date  . NO  PAST SURGERIES    . ROBOTIC ASSISTED TOTAL HYSTERECTOMY WITH BILATERAL SALPINGO OOPHERECTOMY N/A 11/18/2016   Procedure: XI ROBOTIC ASSISTED TOTAL HYSTERECTOMY WITH BILATERAL SALPINGO OOPHORECTOMY;  Surgeon: Everitt Amber, MD;  Location: [facility];  Service: Gynecology;  Laterality: N/A;  . SENTINEL NODE BIOPSY N/A 11/18/2016   Procedure: SENTINEL NODE BIOPSY;  Surgeon: Everitt Amber, MD;  Location: [facility];  Service: Gynecology;  Laterality: N/A;     Past Medical Hx:  Past Medical History:  Diagnosis Date  . Cancer (HCC)    endometrial  . Headache   . Localized swelling of both lower legs    wears compression hose  . Phlebitis alone left leg  4-5 yrs ago    Past Gynecological History:  G0 No LMP recorded. Patient is postmenopausal.  Family Hx: History reviewed. No pertinent family history.  Review of Systems:  Constitutional  Feels well,    ENT Normal appearing ears and nares bilaterally Skin/Breast  No rash, sores, jaundice, itching, dryness Cardiovascular  No chest pain, shortness of breath, or edema  Pulmonary  No cough or wheeze.  Gastro Intestinal  No nausea, vomitting, or diarrhoea. No bright red blood per rectum, no abdominal pain, change in bowel movement, or constipation.  Genito Urinary  No frequency, urgency, dysuria, + postmenopausal bleeding Musculo Skeletal  No myalgia, arthralgia, joint swelling or pain  Neurologic  No weakness, numbness, change in gait,  Psychology  No depression, anxiety, insomnia.   Vitals:  Blood pressure (!) 142/71, pulse 100, temperature 98.3 F (36.8 C), temperature source Oral, resp. rate 18, height '5\' 6"'  (1.676 m), weight 255 lb 8 oz (115.9 kg), SpO2 98 %.  Physical Exam: WD in NAD Neck  Supple NROM, without any enlargements.  Lymph Node Survey No cervical supraclavicular or inguinal adenopathy Cardiovascular  Pulse normal rate, regularity and rhythm. S1 and S2 normal.  Lungs  Clear to auscultation bilateraly, without wheezes/crackles/rhonchi. Good air movement.  Skin  No rash/lesions/breakdown  Psychiatry  Alert and oriented to person, place, and time  Abdomen  Normoactive bowel sounds, abdomen soft, non-tender and obese without evidence of hernia.  Back No CVA tenderness Genito Urinary  Vulva/vagina: Normal external female genitalia.   No lesions. No discharge or bleeding.  Bladder/urethra:  No lesions or masses, well supported bladder  Vagina: long  vagina, difficult to see or reach cuff. No blood. No apparent dehiscence.  Cervix & uterus: surgically absent.   Rectal  deferred Extremities  No bilateral cyanosis, clubbing or edema.  30 minutes of direct face to face counseling time was spent with the patient. This included discussion about prognosis, therapy recommendations and postoperative side effects and are beyond the scope of routine postoperative care.   Donaciano [name], MD  12/10/2016, 3:16 PM

## 2016-12-10 ENCOUNTER — Ambulatory Visit: Payer: BLUE CROSS/BLUE SHIELD | Attending: Gynecologic Oncology | Admitting: Gynecologic Oncology

## 2016-12-10 ENCOUNTER — Encounter: Payer: Self-pay | Admitting: Radiation Oncology

## 2016-12-10 ENCOUNTER — Encounter: Payer: Self-pay | Admitting: Gynecologic Oncology

## 2016-12-10 VITALS — BP 142/71 | HR 100 | Temp 98.3°F | Resp 18 | Ht 66.0 in | Wt 255.5 lb

## 2016-12-10 DIAGNOSIS — Z6841 Body Mass Index (BMI) 40.0 and over, adult: Secondary | ICD-10-CM | POA: Diagnosis not present

## 2016-12-10 DIAGNOSIS — Z9889 Other specified postprocedural states: Secondary | ICD-10-CM | POA: Insufficient documentation

## 2016-12-10 DIAGNOSIS — R918 Other nonspecific abnormal finding of lung field: Secondary | ICD-10-CM | POA: Diagnosis not present

## 2016-12-10 DIAGNOSIS — C541 Malignant neoplasm of endometrium: Secondary | ICD-10-CM | POA: Diagnosis not present

## 2016-12-10 NOTE — Patient Instructions (Addendum)
Referral has been made to Dr Sondra Come.  Plan to meet with him here at the [facility] on Feb 14 at 3:30pm.   You will also receive a phone call for a genetics referral as well.  PET scan at [facility] on Feb 7 at 6:45am with arrival time of 6:15am.  Advised to have nothing to eat or drink after midnight the night before.

## 2016-12-13 NOTE — Progress Notes (Signed)
GYN Location of Tumor / Histology: stage IB high grade endometrial cancer  Brianna Herring presented with symptoms of: postmenopausal bleeding in September, 2017   Biopsies revealed:   11/18/16 Diagnosis 1. Lymph node, sentinel, biopsy, right external iliac ONE BENIGN LYMPH NODE (0/1) 2. Lymph node, sentinel, biopsy, right obturator THREE BENIGN LYMPH NODES (0/3) 3. Lymph node, sentinel, biopsy, right external iliac ONE BENIGN LYMPH NODE (0/1) 4. Uterus +/- tubes/ovaries, neoplastic ENDOMETRIOID ADENOCARCINOMA, FIGO GRADE 3 (4.0 CM) THE CARCINOMA INVADES ONE-HALF OR MORE OF THE MYOMETRIUM (PT1B) LYMPHOVASCULAR INVASION IS IDENTIFIED ALL MARGINS OF RESECTION ARE NEGATIVE FOR TUMOR UNREMARKABLE BILATERAL OVARIES AND FALLOPIAN TUBES 5. Lymph node, sentinel, biopsy, left external iliac ONE BENIGN LYMPH NODE (0/1)  Past/Anticipated interventions by Gyn/Onc surgery, if any: 11/18/16 - Procedure: XI ROBOTIC ASSISTED TOTAL HYSTERECTOMY WITH BILATERAL SALPINGO OOPHORECTOMY;  Surgeon: Everitt Amber, MD  Past/Anticipated interventions by medical oncology, if any: Per Dr. Denman George "If PET avid nodules in chest, recommend adjuvant chemotherapy with 6 cycles of carboplatin and paclitaxel."    Weight changes, if any: no  Bowel/Bladder complaints, if any: No.  Nausea/Vomiting, if any: no  Pain issues, if any:  no  SAFETY ISSUES:  Prior radiation? no  Pacemaker/ICD? no  Possible current pregnancy? no  Is the patient on methotrexate? no  Current Complaints / other details:  Patient is here with her sister.  She would like to have treatment in [place].  BP 136/76 (BP Location: Right Arm, Patient Position: Sitting)   Pulse (!) 102   Temp 98 F (36.7 C) (Oral)   Ht 5\' 6"  (1.676 m)   Wt 254 lb 12.8 oz (115.6 kg)   SpO2 97%   BMI 41.13 kg/m    Wt Readings from Last 3 Encounters:  12/22/16 254 lb 12.8 oz (115.6 kg)  12/10/16 255 lb 8 oz (115.9 kg)  11/18/16 255 lb (115.7 kg)

## 2016-12-16 ENCOUNTER — Telehealth: Payer: Self-pay

## 2016-12-16 NOTE — Telephone Encounter (Signed)
Told Ms Iskander the PET scan looked good, especially the lungs per Meliss Cross,NP. She just needs  the radiation therapy.  Keep appointment with  Dr. Sondra Come on 12-22-16 @ 3:30pm to discuss radiation treatment plan. Ms Alva verbalized understanding.

## 2016-12-22 ENCOUNTER — Encounter: Payer: Self-pay | Admitting: Radiation Oncology

## 2016-12-22 ENCOUNTER — Ambulatory Visit
Admission: RE | Admit: 2016-12-22 | Discharge: 2016-12-22 | Disposition: A | Discharge status: Home / Self Care | Payer: BLUE CROSS/BLUE SHIELD | Source: Ambulatory Visit | Attending: Radiation Oncology | Admitting: Radiation Oncology

## 2016-12-22 ENCOUNTER — Telehealth: Payer: Self-pay

## 2016-12-22 DIAGNOSIS — Z79891 Long term (current) use of opiate analgesic: Secondary | ICD-10-CM | POA: Diagnosis not present

## 2016-12-22 DIAGNOSIS — Z510 Encounter for antineoplastic radiation therapy: Secondary | ICD-10-CM | POA: Insufficient documentation

## 2016-12-22 DIAGNOSIS — Z79899 Other long term (current) drug therapy: Secondary | ICD-10-CM | POA: Diagnosis not present

## 2016-12-22 DIAGNOSIS — C541 Malignant neoplasm of endometrium: Secondary | ICD-10-CM | POA: Insufficient documentation

## 2016-12-22 NOTE — Progress Notes (Signed)
Please see the Nurse Progress Note in the MD Initial Consult Encounter for this patient. 

## 2016-12-22 NOTE — Progress Notes (Signed)
Radiation Oncology         [phone] ________________________________  Initial Outpatient Consultation  Name: Brianna Herring MRN: [id number]  Date: 12/22/2016  DOB: 12/25/1955  AQ:[id number] Street, MD  Everitt Amber, MD   REFERRING PHYSICIAN: Everitt Amber, MD  DIAGNOSIS: Stage I-B grade 3 endometrial cancer.   HISTORY OF PRESENT ILLNESS::Brianna Herring is a 61 y.o. female who is seen at the courtesy  of Dr. Denman George. The patient initially presented with postmenopausal bleeding in September 2017, which had been ongoing for 6-8 months. She was evaluated by Dr. Alfonse Spruce in November 2017 when a TVUS was performed on 09/13/16 showing a uterus measuring 6.6 x 4.7 x 3.1 cm with thickened ES of 14 mm.  The patient was taken to the operating room by Dr. Alfonse Spruce on 10/06/16 for a D&C. Pathology revealed high grade endometrial cancer, grade 3.  CT of abdomen and pelvis on 11/15/16 revealed subtle low-attenuation in central uterus in area of endometrial cavity, consistent with known endometrial carcinoma. There ws no other evidence of metastatic disease within the abdomen or pelvis. Chest CT on the same day showed multiple pulmonary nodules suspicious for metastatic disease. These were noted to be most numerous within the right lower lobe and could be postinflammatory.  On 11/18/16 the patient underwent robotic assisted total hysterectomy with bilateral salpingo oophorectomy with Dr. Denman George. Operative findings revealed a 6 cm uterus with normal appearing tubes and ovaries, duplicated ureters bilaterally. Surgical pathology revealed endometrioid adenocarcinoma, FIGO Grade 3, invading one-half or more of the myometrium, with lymphovascular invasion identified. Margins of resection were negative for malignancy. Sentinel lymph nodes from the left and right pelvis area showed no evidence of metastatic disease.  The patient presents to the clinic today to discuss the role that radiation may play in the treatment  of her disease. She is accompanied to the clinic today by her sister.  On review of systems, the patient denies any weight changes. She denies pain. She denies nausea or vomiting. She denies bowel or bladder concerns. She denies vaginal bleeding at this time. The patient is interested in receiving treatment in [place] at [facility]. She questions if she will be able to go back to work.  PREVIOUS RADIATION THERAPY: No  PAST MEDICAL HISTORY:  has a past medical history of Cancer ([place]); Headache; Localized swelling of both lower legs; and Phlebitis alone (left leg  4-5 yrs ago).    PAST SURGICAL HISTORY: Past Surgical History:  Procedure Laterality Date  . NO PAST SURGERIES    . ROBOTIC ASSISTED TOTAL HYSTERECTOMY WITH BILATERAL SALPINGO OOPHERECTOMY N/A 11/18/2016   Procedure: XI ROBOTIC ASSISTED TOTAL HYSTERECTOMY WITH BILATERAL SALPINGO OOPHORECTOMY;  Surgeon: Everitt Amber, MD;  Location: [facility];  Service: Gynecology;  Laterality: N/A;  . SENTINEL NODE BIOPSY N/A 11/18/2016   Procedure: SENTINEL NODE BIOPSY;  Surgeon: Everitt Amber, MD;  Location: [facility];  Service: Gynecology;  Laterality: N/A;    FAMILY HISTORY: family history includes Breast cancer in her sister; Lung cancer in her father.  SOCIAL HISTORY:  reports that she has never smoked. She has never used smokeless tobacco. She reports that she does not drink alcohol or use drugs. The patient currently lives in [place].  ALLERGIES: Patient has no known allergies.  MEDICATIONS:  Current Outpatient Prescriptions  Medication Sig Dispense Refill  . calcium carbonate (CALTRATE 600) 1500 (600 Ca) MG TABS tablet Take 1,500 tablets by mouth 2 (two) times daily.     . Multiple Vitamins-Minerals (CENTRUM SILVER  PO) Take 1 tablet by mouth daily.     Marland Kitchen oxyCODONE (OXY IR/ROXICODONE) 5 MG immediate release tablet Take 1-2 tablets (5-10 mg total) by mouth every 6 (six) hours as needed for severe pain. (Patient not taking: Reported on  12/22/2016) 30 tablet 0   No current facility-administered medications for this encounter.     REVIEW OF SYSTEMS:  A 15 point review of systems is documented in the electronic medical record. This was obtained by the nursing staff. However, I reviewed this with the patient to discuss relevant findings and make appropriate changes.  Pertinent items are noted in HPI.   PHYSICAL EXAM:  height is 5\' 6"  (1.676 m) and weight is 254 lb 12.8 oz (115.6 kg). Her oral temperature is 98 F (36.7 C). Her blood pressure is 136/76 and her pulse is 102 (abnormal). Her oxygen saturation is 97%.   General: Alert and oriented, in no acute distress. HEENT: Extraocular movements are intact. PERRLA. Oropharynx and oral cavity are clear. Neck: Neck is supple, no palpable cervical or supraclavicular lymphadenopathy. Heart: Regular in rate and rhythm with no murmurs. Chest: Clear to auscultation bilaterally. Abdomen: Soft, nontender, nondistended, with no rigidity or guarding. Laparoscopic scars are healing well without signs of drainage or infection. Extremities: No edema. Lymphatics: see Neck Exam Skin: No concerning lesions. Musculoskeletal: symmetric strength and muscle tone throughout. Pelvic exam is deferred until simulation and planning day.   ECOG = 1  LABORATORY DATA:  Lab Results  Component Value Date   WBC 6.9 11/19/2016   HGB 11.8 (L) 11/19/2016   HCT 36.1 11/19/2016   MCV 92.3 11/19/2016   PLT 234 11/19/2016   NEUTROABS 4.3 11/15/2016   Lab Results  Component Value Date   NA 137 11/19/2016   K 3.9 11/19/2016   CL 100 (L) 11/19/2016   CO2 30 11/19/2016   GLUCOSE 118 (H) 11/19/2016   CREATININE 0.80 11/19/2016   CALCIUM 8.3 (L) 11/19/2016      RADIOGRAPHY: No results found.    IMPRESSION: Stage I-B grade 3 endometrial cancer.  Patient was found to have a deeply invasive tumor, high grade with lymphovascular space invasion. She would be at risk for recurrence and I would agree with  Dr. Serita Grit recommendations for adjuvant therapy.  Treatment will include external beam radiation therapy directed to the pelvis. Would also recommend vaginal vault brachytherapy given the presence of lymphovascular space invasion.   We discussed the risks, benefits, and side effects logistics of radiation therapy for the patient's education.  The patient lives in [place] and is closer to [facility]. She would like to receive her treatment there. I advised the patient that EB[facility] could be completed at [facility], but that she would have to return to our clinic for vaginal brachytherapy treatments.  She is interested in proceeding with radiation therapy. I will refer her to Dr. Orlene Erm at the [facility].  PLAN:  We will see the patient back at the completion of her EBRT for the brachytherapy procedure and treatment.     ------------------------------------------------  Blair Promise, PhD, MD  This document serves as a record of services personally performed by Gery Pray, MD. It was created on his behalf by Maryla Morrow, a trained medical scribe. The creation of this record is based on the scribe's personal observations and the provider's statements to them. This document has been checked and approved by the attending provider.

## 2016-12-22 NOTE — Telephone Encounter (Signed)
FMLA completed and faxed to [facility] at [phone]

## 2017-01-19 ENCOUNTER — Encounter: Payer: Self-pay | Admitting: Radiation Oncology

## 2017-01-20 NOTE — Progress Notes (Signed)
GYN Location of Tumor / Histology: stage IB high grade endometrial cancer  Brianna Herring presented with symptoms of: postmenopausal bleeding in September, 2017   Biopsies revealed:   11/18/16 Diagnosis 1. Lymph node, sentinel, biopsy, right external iliac ONE BENIGN LYMPH NODE (0/1) 2. Lymph node, sentinel, biopsy, right obturator THREE BENIGN LYMPH NODES (0/3) 3. Lymph node, sentinel, biopsy, right external iliac ONE BENIGN LYMPH NODE (0/1) 4. Uterus +/- tubes/ovaries, neoplastic ENDOMETRIOID ADENOCARCINOMA, FIGO GRADE 3 (4.0 CM) THE CARCINOMA INVADES ONE-HALF OR MORE OF THE MYOMETRIUM (PT1B) LYMPHOVASCULAR INVASION IS IDENTIFIED ALL MARGINS OF RESECTION ARE NEGATIVE FOR TUMOR UNREMARKABLE BILATERAL OVARIES AND FALLOPIAN TUBES 5. Lymph node, sentinel, biopsy, left external iliac ONE BENIGN LYMPH NODE (0/1)  Past/Anticipated interventions by Gyn/Onc surgery, if any: 11/18/16 - Procedure: XI ROBOTIC ASSISTED TOTAL HYSTERECTOMY WITH BILATERAL SALPINGO OOPHORECTOMY;  Surgeon: Everitt Amber, MD  Past/Anticipated interventions by medical oncology, if any: Per Dr. Denman George "If PET avid nodules in chest, recommend adjuvant chemotherapy with 6 cycles of carboplatin and paclitaxel."    Weight changes, if any: no  Bowel/Bladder complaints, if any: No.  Nausea/Vomiting, if any: no  Pain issues, if any:  no  SAFETY ISSUES:  Prior radiation? no  Pacemaker/ICD? no  Possible current pregnancy? no  Is the patient on methotrexate? no  Current Complaints / other details:  She would like to have treatment in [place].

## 2017-01-24 ENCOUNTER — Ambulatory Visit: Payer: BLUE CROSS/BLUE SHIELD

## 2017-01-24 ENCOUNTER — Encounter: Payer: Self-pay | Admitting: Radiation Oncology

## 2017-01-24 ENCOUNTER — Ambulatory Visit
Admission: RE | Admit: 2017-01-24 | Discharge: 2017-01-24 | Disposition: A | Discharge status: Home / Self Care | Payer: BLUE CROSS/BLUE SHIELD | Source: Ambulatory Visit | Attending: Radiation Oncology | Admitting: Radiation Oncology

## 2017-02-01 ENCOUNTER — Telehealth: Payer: Self-pay | Admitting: Oncology

## 2017-02-01 NOTE — Telephone Encounter (Signed)
Left a message for patient regarding her upcoming appointment.  Requested a return call.

## 2017-02-04 ENCOUNTER — Telehealth: Payer: Self-pay | Admitting: Oncology

## 2017-02-04 NOTE — Telephone Encounter (Addendum)
Called patient and explained that the consult scheduled on 02/21/17 is to discuss brachytherapy which cannot be done in [place].  Brianna Herring verbalized understanding and agreement and verified appointment time and date.

## 2017-02-21 ENCOUNTER — Ambulatory Visit
Admission: RE | Admit: 2017-02-21 | Discharge: 2017-02-21 | Disposition: A | Discharge status: Home / Self Care | Payer: BLUE CROSS/BLUE SHIELD | Source: Ambulatory Visit | Attending: Radiation Oncology | Admitting: Radiation Oncology

## 2017-02-23 ENCOUNTER — Ambulatory Visit
Admission: RE | Admit: 2017-02-23 | Discharge: 2017-02-23 | Disposition: A | Discharge status: Home / Self Care | Payer: BLUE CROSS/BLUE SHIELD | Source: Ambulatory Visit | Attending: Radiation Oncology | Admitting: Radiation Oncology

## 2017-02-23 ENCOUNTER — Encounter: Payer: Self-pay | Admitting: Radiation Oncology

## 2017-02-23 ENCOUNTER — Ambulatory Visit: Payer: BLUE CROSS/BLUE SHIELD | Admitting: Radiation Oncology

## 2017-02-23 VITALS — BP 134/70 | HR 109 | Temp 98.4°F | Ht 66.0 in | Wt 248.2 lb

## 2017-02-23 DIAGNOSIS — C541 Malignant neoplasm of endometrium: Secondary | ICD-10-CM

## 2017-02-23 NOTE — Progress Notes (Signed)
GYN Location of Tumor / Histology: stage IB high grade endometrial cancer  Brianna Herring presented with symptoms of: postmenopausal bleeding in September, 2017   Biopsies revealed:   11/18/16 Diagnosis 1. Lymph node, sentinel, biopsy, right external iliac ONE BENIGN LYMPH NODE (0/1) 2. Lymph node, sentinel, biopsy, right obturator THREE BENIGN LYMPH NODES (0/3) 3. Lymph node, sentinel, biopsy, right external iliac ONE BENIGN LYMPH NODE (0/1) 4. Uterus +/- tubes/ovaries, neoplastic ENDOMETRIOID ADENOCARCINOMA, FIGO GRADE 3 (4.0 CM) THE CARCINOMA INVADES ONE-HALF OR MORE OF THE MYOMETRIUM (PT1B) LYMPHOVASCULAR INVASION IS IDENTIFIED ALL MARGINS OF RESECTION ARE NEGATIVE FOR TUMOR UNREMARKABLE BILATERAL OVARIES AND FALLOPIAN TUBES 5. Lymph node, sentinel, biopsy, left external iliac ONE BENIGN LYMPH NODE (0/1)  Past/Anticipated interventions by Gyn/Onc surgery, if any: 11/18/16 - Procedure: XI ROBOTIC ASSISTED TOTAL HYSTERECTOMY WITH BILATERAL SALPINGO OOPHORECTOMY;  Surgeon: Everitt Amber, MD  Past/Anticipated interventions by medical oncology, if any: no  Weight changes, if any: no  Bowel/Bladder complaints, if any: Reports having dysuria.  She said she was treated for a UTI with 2 different antibiotics and pyridium. She continues to have burning with urination.  She denies having any bowel issues.  Nausea/Vomiting, if any: no  Pain issues, if any:  no  SAFETY ISSUES:  Prior radiation? Had external beam radiation in [place] - last treatment was 02/16/17.  Pacemaker/ICD? no  Possible current pregnancy? no  Is the patient on methotrexate? no  Current Complaints / other details:  Patient is here with her mother.  BP 134/70 (BP Location: Right Arm, Patient Position: Sitting)   Pulse (!) 109   Temp 98.4 F (36.9 C) (Oral)   Ht 5\' 6"  (1.676 m)   Wt 248 lb 3.2 oz (112.6 kg)   SpO2 97%   BMI 40.06 kg/m    Wt Readings from Last 3 Encounters:  02/23/17 248 lb 3.2 oz  (112.6 kg)  12/22/16 254 lb 12.8 oz (115.6 kg)  12/10/16 255 lb 8 oz (115.9 kg)

## 2017-02-23 NOTE — Progress Notes (Signed)
Please see the Nurse Progress Note in the MD Initial Consult Encounter for this patient. 

## 2017-02-23 NOTE — Progress Notes (Signed)
  Radiation Oncology         [phone] ________________________________  Name: Brianna Herring MRN: [id number]  Date: 02/23/2017  DOB: [birth year]  Re-consultation Note  CC: Christa See, MD  Everitt Amber, MD    ICD-9-CM ICD-10-CM   1. Endometrial adenocarcinoma (HCC) 182.0 C54.1     Diagnosis:  Stage I-B grade 3 endometrial cancer  Narrative:  The patient returns today for re-evaluation. Since the initial consultation on 12/22/16, she completed external beam radiation in [place]. The last treatment was 02/16/17.  The patient reports dysuria and she is currently being treated with antibiotics and pyridium for a UTI. She denies bowel issues. She denies weight changes, nausea/vomiting, or pain at this time. She presents today to discuss vaginal brachytherapy.                    ALLERGIES:  has No Known Allergies.  Meds: Current Outpatient Prescriptions  Medication Sig Dispense Refill  . calcium carbonate (CALTRATE 600) 1500 (600 Ca) MG TABS tablet Take 1,500 tablets by mouth 2 (two) times daily.     . Multiple Vitamins-Minerals (CENTRUM SILVER PO) Take 1 tablet by mouth daily.     Marland Kitchen oxyCODONE (OXY IR/ROXICODONE) 5 MG immediate release tablet Take 1-2 tablets (5-10 mg total) by mouth every 6 (six) hours as needed for severe pain. (Patient not taking: Reported on 12/22/2016) 30 tablet 0   No current facility-administered medications for this encounter.     Physical Findings: The patient is in no acute distress. Patient is alert and oriented.  height is 5\' 6"  (1.676 m) and weight is 248 lb 3.2 oz (112.6 kg). Her oral temperature is 98.4 F (36.9 C). Her blood pressure is 134/70 and her pulse is 109 (abnormal). Her oxygen saturation is 97%. .  No significant changes. Physical and pelvic exam deferred until treatment day.   Lab Findings: Lab Results  Component Value Date   WBC 6.9 11/19/2016   HGB 11.8 (L) 11/19/2016   HCT 36.1 11/19/2016   MCV 92.3 11/19/2016   PLT 234  11/19/2016    Radiographic Findings: No results found.  Impression: Stage I-B grade 3 endometrial cancer. The patient would be a good candidate for vaginal brachytherapy. We discussed the course of treatment, side effects, and potential toxicities with the patient and her mother. She appears to understand and wishes to proceed with treatment. A consent form was signed and a copy was placed in the patient's chart.  Plan:  The patient will be scheduled for fitting, planning, and her first treatment next week. Anticipate 3 intracavitary radiation treatments with iridium 192 as the high-dose-rate source.  ____________________________________   This document serves as a record of services personally performed by Gery Pray, MD. It was created on his behalf by Bethann Humble, a trained medical scribe. The creation of this record is based on the scribe's personal observations and the provider's statements to them. This document has been checked and approved by the attending provider.

## 2017-02-28 ENCOUNTER — Telehealth: Payer: Self-pay | Admitting: *Deleted

## 2017-02-28 NOTE — Telephone Encounter (Signed)
Called patient to inform of New [facility], spoke with patient and she is aware of these appts. 

## 2017-03-01 ENCOUNTER — Telehealth: Payer: Self-pay | Admitting: *Deleted

## 2017-03-01 NOTE — Telephone Encounter (Signed)
Called patient to remind of [facility]DR [place] for 03-02-17, lvm for a return call

## 2017-03-02 ENCOUNTER — Ambulatory Visit
Admission: RE | Admit: 2017-03-02 | Discharge: 2017-03-02 | Disposition: A | Discharge status: Home / Self Care | Payer: BLUE CROSS/BLUE SHIELD | Source: Ambulatory Visit | Attending: Radiation Oncology | Admitting: Radiation Oncology

## 2017-03-02 ENCOUNTER — Encounter: Payer: Self-pay | Admitting: Radiation Oncology

## 2017-03-02 VITALS — BP 159/86 | HR 107 | Temp 98.3°F | Ht 66.0 in | Wt 253.2 lb

## 2017-03-02 DIAGNOSIS — C541 Malignant neoplasm of endometrium: Secondary | ICD-10-CM

## 2017-03-02 DIAGNOSIS — Z510 Encounter for antineoplastic radiation therapy: Secondary | ICD-10-CM | POA: Diagnosis not present

## 2017-03-02 MED ORDER — LORAZEPAM 0.5 MG PO TABS
0.5000 mg | ORAL_TABLET | Freq: Once | ORAL | Status: AC
  Administered 2017-03-02: 0.5 mg via SUBLINGUAL
  Filled 2017-03-02: qty 1

## 2017-03-02 NOTE — Progress Notes (Signed)
Brianna Herring is here for follow up/HDR.  She denies having pain.  She reports having dysuria "every now and then."  She denies having nausea, diarrhea or vaginal bleeding/discharge.  She reports having occasional fatigue.  BP (!) 159/86 (BP Location: Right Arm, Patient Position: Sitting)   Pulse (!) 107   Temp 98.3 F (36.8 C) (Oral)   Ht 5\' 6"  (1.676 m)   Wt 253 lb 3.2 oz (114.9 kg)   SpO2 99%   BMI 40.87 kg/m    Wt Readings from Last 3 Encounters:  03/02/17 253 lb 3.2 oz (114.9 kg)  02/23/17 248 lb 3.2 oz (112.6 kg)  12/22/16 254 lb 12.8 oz (115.6 kg)

## 2017-03-02 NOTE — Progress Notes (Signed)
  Radiation Oncology         (336) [phone] ________________________________  Name: Brianna Herring MRN: [id number]  Date: 03/02/2017  DOB: Sep 13, 1956  CC: Christa See, MD  Everitt Amber, MD  HDR BRACHYTHERAPY NOTE  DIAGNOSIS: Stage I-B grade 3 endometrial cancer   Simple treatment device note: Patient had construction of her custom vaginal cylinder. She will be treated with a 2 cm diameter segmented cylinder. This conforms to her anatomy without undue discomfort.  Vaginal brachytherapy procedure node: The patient was brought to the [facility] suite. Identity was confirmed. All relevant records and images related to the planned course of therapy were reviewed. The patient freely provided informed written consent to proceed with treatment after reviewing the details related to the planned course of therapy. The consent form was witnessed and verified by the simulation staff. Then, the patient was set-up in a stable reproducible supine position for radiation therapy. Pelvic exam revealed the vaginal cuff to be intact. The patient's custom vaginal cylinder was placed in the proximal vagina. This was affixed to the CT/MR stabilization plate to prevent slippage. Patient tolerated the placement well.  Verification simulation note:  A fiducial marker was placed within the vaginal cylinder. An AP and lateral film was then obtained through the pelvis area. This documented accurate position of the vaginal cylinder for treatment.  HDR BRACHYTHERAPY TREATMENT  The remote afterloading device was affixed to the vaginal cylinder by catheter. Patient then proceeded to undergo her first high-dose-rate treatment directed at the proximal vagina. The patient was prescribed a dose of 6 gray to be delivered to the mucosal surface. Treatment length was 3 cm. Patient was treated with 1 channel using 7 dwell positions. Treatment time was 102.6 seconds. Iridium 192 was the high-dose-rate source for treatment. The patient  tolerated the treatment well. After completion of her therapy, a radiation survey was performed documenting return of the iridium source into the GammaMed safe.   PLAN: The patient will return next week for her second treatment. ________________________________  Blair Promise, PhD, MD   This document serves as a record of services personally performed by Gery Pray, MD. It was created on his behalf by Bethann Humble, a trained medical scribe. The creation of this record is based on the scribe's personal observations and the provider's statements to them. This document has been checked and approved by the attending provider.

## 2017-03-02 NOTE — Progress Notes (Signed)
Please see the Nurse Progress Note in the MD Initial Consult Encounter for this patient. 

## 2017-03-02 NOTE — Progress Notes (Signed)
  Radiation Oncology         [phone] ________________________________  Name: Brianna Herring MRN: [id number]  Date: 03/02/2017  DOB: 11/09/1955  Vaginal Brachytherapy Procedure Note  CC: Christa See, MD Everitt Amber, MD    ICD-9-CM ICD-10-CM   1. Endometrial adenocarcinoma (HCC) 182.0 C54.1 LORazepam (ATIVAN) tablet 0.5 mg    Diagnosis: Stage I-Bgrade 3endometrial cancer  Narrative: She returns today for vaginal cylinder fitting. Patient reports occasional dysuria, and fatigue. She denies pain, nausea, diarrhea, or vaginal bleeding/discharge.  ALLERGIES: has No Known Allergies.  Meds: Current Outpatient Prescriptions  Medication Sig Dispense Refill  . Multiple Vitamins-Minerals (CENTRUM SILVER PO) Take 1 tablet by mouth daily.     . calcium carbonate (CALTRATE 600) 1500 (600 Ca) MG TABS tablet Take 1,500 tablets by mouth 2 (two) times daily.     Marland Kitchen oxyCODONE (OXY IR/ROXICODONE) 5 MG immediate release tablet Take 1-2 tablets (5-10 mg total) by mouth every 6 (six) hours as needed for severe pain. (Patient not taking: Reported on 12/22/2016) 30 tablet 0   No current facility-administered medications for this encounter.     Physical Findings: The patient is in no acute distress. Patient is alert and oriented.  height is 5\' 6"  (1.676 m) and weight is 253 lb 3.2 oz (114.9 kg). Her oral temperature is 98.3 F (36.8 C). Her blood pressure is 159/86 (abnormal) and her pulse is 107 (abnormal). Her oxygen saturation is 99%.   No palpable cervical, supraclavicular or axillary lymphoadenopathy. The heart has a regular rate and rhythm. The lungs are clear to auscultation. Abdomen soft and non-tender.  On pelvic examination the external genitalia were unremarkable. A speculum exam was performed. Vaginal cuff intact, no mucosal lesions. The vaginal vault was significantly narrowed on exam but does allow placement of a small speculum and index finger on exam.  On bimanual exam there  were no pelvic masses appreciated.   Lab Findings: Lab Results  Component Value Date   WBC 6.9 11/19/2016   HGB 11.8 (L) 11/19/2016   HCT 36.1 11/19/2016   MCV 92.3 11/19/2016   PLT 234 11/19/2016    Radiographic Findings: No results found.  Impression: Stage I-Bgrade 3endometrial cancer  Patient was fitted for a vaginal cylinder. The patient will be treated with a 2 cm diameter cylinder with a treatment length of 3 cm.   The patient was successfully fitted for a vaginal cylinder. The patient is appropriate to begin vaginal brachytherapy.  The exam was very uncomfortable for the patient. She is unsure if she will be able to tolerate the simulation or treatment. She will be provided with Ativan for anxiety with the procedure today. Family members will drive her home.   Plan: The patient will proceed with CT simulation and vaginal brachytherapy today.    _______________________________   Blair Promise, PhD, MD This document serves as a record of services personally performed by Gery Pray, MD. It was created on his behalf by Bethann Humble, a trained medical scribe. The creation of this record is based on the scribe's personal observations and the provider's statements to them. This document has been checked and approved by the attending provider.

## 2017-03-02 NOTE — Progress Notes (Signed)
  Radiation Oncology         [phone]) [phone] ________________________________  Name: Brianna Herring MRN: [id number]  Date: 03/02/2017  DOB: 1956/10/10  SIMULATION AND TREATMENT PLANNING NOTE HDR BRACHYTHERAPY  DIAGNOSIS:  Stage I-Bgrade 3endometrial cancer  NARRATIVE:  The patient was brought to the [facility].  Identity was confirmed.  All relevant records and images related to the planned course of therapy were reviewed.  The patient freely provided informed written consent to proceed with treatment after reviewing the details related to the planned course of therapy. The consent form was witnessed and verified by the simulation staff.  Then, the patient was set-up in a stable reproducible  supine position for radiation therapy.  CT images were obtained.  Surface markings were placed.  The CT images were loaded into the planning software.  Then the target and avoidance structures were contoured.  Treatment planning then occurred.  The radiation prescription was entered and confirmed.   I have requested : Brachytherapy Isodose Plan and Dosimetry Calculations to plan the radiation distribution.    PLAN:  The patient will receive 18 Gy in 3 fractions using Ir-192.  2 cm diameter cylinder, 3.0 cm treatment length.    ________________________________  Blair Promise, PhD, MD  This document serves as a record of services personally performed by Gery Pray, MD. It was created on his behalf by Bethann Humble, a trained medical scribe. The creation of this record is based on the scribe's personal observations and the provider's statements to them. This document has been checked and approved by the attending provider.

## 2017-03-03 ENCOUNTER — Encounter: Payer: Self-pay | Admitting: Radiation Oncology

## 2017-03-04 ENCOUNTER — Encounter: Payer: Self-pay | Admitting: Radiation Oncology

## 2017-03-08 ENCOUNTER — Telehealth: Payer: Self-pay | Admitting: *Deleted

## 2017-03-08 NOTE — Telephone Encounter (Signed)
CALLED PATIENT TO REMIND OF HDR [place] 03-09-17 @ 3 PM, LVM FOR A RETURN CALL

## 2017-03-09 ENCOUNTER — Ambulatory Visit
Admission: RE | Admit: 2017-03-09 | Discharge: 2017-03-09 | Disposition: A | Discharge status: Home / Self Care | Payer: BLUE CROSS/BLUE SHIELD | Source: Ambulatory Visit | Attending: Radiation Oncology | Admitting: Radiation Oncology

## 2017-03-09 DIAGNOSIS — Z510 Encounter for antineoplastic radiation therapy: Secondary | ICD-10-CM | POA: Diagnosis not present

## 2017-03-09 DIAGNOSIS — C541 Malignant neoplasm of endometrium: Secondary | ICD-10-CM

## 2017-03-09 NOTE — Progress Notes (Signed)
  Radiation Oncology         [phone] ________________________________  Name: Brianna Herring MRN: [id number]  Date: 03/09/2017  DOB: 1956/09/30  CC: Christa See, MD  Everitt Amber, MD  HDR BRACHYTHERAPY NOTE  DIAGNOSIS: Stage I-Bgrade 3endometrial cancer   Simple treatment device note: Patient had construction of her custom vaginal cylinder. She will be treated with a 2.0 cm diameter segmented cylinder. This conforms to her anatomy without undue discomfort.  Vaginal brachytherapy procedure node: The patient was brought to the [place] suite. Identity was confirmed. All relevant records and images related to the planned course of therapy were reviewed. The patient freely provided informed written consent to proceed with treatment after reviewing the details related to the planned course of therapy. The consent form was witnessed and verified by the simulation staff. Then, the patient was set-up in a stable reproducible supine position for radiation therapy. Pelvic exam revealed the vaginal cuff to be intact. The patient's custom vaginal cylinder was placed in the proximal vagina. This was affixed to the CT/MR stabilization plate to prevent slippage. Patient tolerated the placement well.  Verification simulation note:  A fiducial marker was placed within the vaginal cylinder. An AP and lateral film was then obtained through the pelvis area. This documented accurate position of the vaginal cylinder for treatment.  HDR BRACHYTHERAPY TREATMENT  The remote afterloading device was affixed to the vaginal cylinder by catheter. Patient then proceeded to undergo her second high-dose-rate treatment directed at the proximal vagina. The patient was prescribed a dose of 6.0 gray to be delivered to the mucosal surface. Treatment length was 3.0 cm. Patient was treated with 1 channel using 9 dwell positions. Treatment time was 109.5 seconds. Iridium 192 was the high-dose-rate source for treatment. The  patient tolerated the treatment well. After completion of her therapy, a radiation survey was performed documenting return of the iridium source into the Gamma[employer] safe.   PLAN: The patient will return next week for her third and final high-dose rate treatment. ________________________________  Blair Promise, PhD, MD

## 2017-03-15 ENCOUNTER — Telehealth: Payer: Self-pay | Admitting: *Deleted

## 2017-03-15 NOTE — Telephone Encounter (Signed)
CALLED PATIENT TO REMIND OF HDR [place] 03-16-17 @ 3 PM, LVM FOR A RETURN CALL

## 2017-03-16 ENCOUNTER — Ambulatory Visit
Admission: RE | Admit: 2017-03-16 | Discharge: 2017-03-16 | Disposition: A | Discharge status: Home / Self Care | Payer: BLUE CROSS/BLUE SHIELD | Source: Ambulatory Visit | Attending: Radiation Oncology | Admitting: Radiation Oncology

## 2017-03-16 DIAGNOSIS — C541 Malignant neoplasm of endometrium: Secondary | ICD-10-CM

## 2017-03-16 DIAGNOSIS — Z510 Encounter for antineoplastic radiation therapy: Secondary | ICD-10-CM | POA: Diagnosis not present

## 2017-03-16 NOTE — Progress Notes (Signed)
  Radiation Oncology         [phone] ________________________________  Name: Nafeesah Lapaglia MRN: [id number]  Date: 03/16/2017  DOB: December 19, 1955  End of Treatment Note  Diagnosis:   Stage I-B grade 3 endometrial cancer     Indication for treatment:  Curative       Radiation treatment dates:   03/02/17 - 03/16/17 - The patient received 50.4 Gy of external beam radiation therapy at [facility] in [place] prior to her brachytherapy treatments  Site/dose: Vaginal cuff treated to 18 Gy with 3 fx of 6 Gy  Beams/energy:   HDR Ir-192 Vaginal  //  Iridium HDR,  Iridium 192 was the high-dose-rate source. The patient was be treated with a 2 cm diameter cylinder with a treatment length of 3 cm.  Prescription was to the mucosal surface.  Narrative: The patient tolerated radiation treatment relatively well.   Pt denied weight changes, nausea/vomiting, and pain throughout treatment. She experienced discomfort with placement of the cylinder  Plan: The patient has completed radiation treatment. The patient will return to radiation oncology clinic for routine followup in one month. I advised them to call or return sooner if they have any questions or concerns related to their recovery or treatment.  -----------------------------------  Blair Promise, PhD, MD  This document serves as a record of services personally performed by Gery Pray, MD. It was created on his behalf by Linward Natal, a trained medical scribe. The creation of this record is based on the scribe's personal observations and the provider's statements to them. This document has been checked and approved by the attending provider.

## 2017-03-16 NOTE — Progress Notes (Signed)
  Radiation Oncology         [phone] ________________________________  Name: Brianna Herring MRN: [id number]  Date: 03/16/2017  DOB: Jul 20, 1956  CC: Street, [name], MD  Everitt Amber, MD  HDR BRACHYTHERAPY NOTE  DIAGNOSIS: Stage I-Bgrade 3endometrial cancer   Simple treatment device note: Patient had construction of her custom vaginal cylinder. She will be treated with a 2.0 cm diameter segmented cylinder. This conforms to her anatomy without undue discomfort.  Vaginal brachytherapy procedure node: The patient was brought to the [facility] suite. Identity was confirmed. All relevant records and images related to the planned course of therapy were reviewed. The patient freely provided informed written consent to proceed with treatment after reviewing the details related to the planned course of therapy. The consent form was witnessed and verified by the simulation staff. Then, the patient was set-up in a stable reproducible supine position for radiation therapy. Pelvic exam revealed the vaginal cuff to be intact. The patient's custom vaginal cylinder was placed in the proximal vagina. This was affixed to the CT/MR stabilization plate to prevent slippage. Patient tolerated the placement well.  Verification simulation note:  A fiducial marker was placed within the vaginal cylinder. An AP and lateral film was then obtained through the pelvis area. This documented accurate position of the vaginal cylinder for treatment.  HDR BRACHYTHERAPY TREATMENT  The remote afterloading device was affixed to the vaginal cylinder by catheter. Patient then proceeded to undergo her third high-dose-rate treatment directed at the proximal vagina. The patient was prescribed a dose of 6.0 gray to be delivered to the mucosal surface. Treatment length was 3.0 cm. Patient was treated with 1 channel using 7 dwell positions. Treatment time was 117 seconds. Iridium 192 was the high-dose-rate source for treatment. The  patient tolerated the treatment well. After completion of her therapy, a radiation survey was performed documenting return of the iridium source into the GammaMed safe.   PLAN: The patient has completed HDR treatment. She will return in about a month for follow up. ________________________________  Blair Promise, PhD, MD   This document serves as a record of services personally performed by Gery Pray, MD. It was created on his behalf by Linward Natal, a trained medical scribe. The creation of this record is based on the scribe's personal observations and the provider's statements to them. This document has been checked and approved by the attending provider.

## 2017-04-15 ENCOUNTER — Encounter: Payer: Self-pay | Admitting: Oncology

## 2017-04-18 ENCOUNTER — Telehealth: Payer: Self-pay | Admitting: *Deleted

## 2017-04-18 ENCOUNTER — Encounter: Payer: Self-pay | Admitting: Radiation Oncology

## 2017-04-18 ENCOUNTER — Ambulatory Visit
Admission: RE | Admit: 2017-04-18 | Discharge: 2017-04-18 | Disposition: A | Discharge status: Home / Self Care | Payer: BLUE CROSS/BLUE SHIELD | Source: Ambulatory Visit | Attending: Radiation Oncology | Admitting: Radiation Oncology

## 2017-04-18 DIAGNOSIS — C541 Malignant neoplasm of endometrium: Secondary | ICD-10-CM

## 2017-04-18 DIAGNOSIS — Z923 Personal history of irradiation: Secondary | ICD-10-CM | POA: Diagnosis not present

## 2017-04-18 NOTE — Progress Notes (Signed)
  Radiation Oncology         [phone] ________________________________  Name: Brianna Herring MRN: [id number]  Date: 04/18/2017  DOB: [birth year]  Follow-Up Visit Note  CC: Street, [name], MD  Everitt Amber, MD  Diagnosis: Stage I-B grade 3 endometrial cancer     Interval Since Last Radiation: 1 month  Radiation treatment dates:   03/02/17 - 03/16/17 - The patient received 50.4 Gy of external beam radiation therapy at [facility] in [place] prior to her brachytherapy treatments  Site/dose: Vaginal cuff treated to 18 Gy with 3 fx of 6 Gy  Beams/energy:   HDR Ir-192 Vaginal // Iridium HDR, Iridium 192 wasthe high-dose-rate source. The patient wasbe treated with a 2 cm diameter cylinder with a treatment length of 3 cm. Prescription wasto the mucosal surface.  Narrative:  The patient returns today for routine follow-up. She denies having pain, bladder/bowel issues or vaginal bleeding/discharge.  She reports having a good energy level.  She had been given size XS and XS+ vaginal dilators.                              ALLERGIES:  has No Known Allergies.  Meds: Current Outpatient Prescriptions  Medication Sig Dispense Refill  . calcium carbonate (CALTRATE 600) 1500 (600 Ca) MG TABS tablet Take 1,500 tablets by mouth 2 (two) times daily.     . Multiple Vitamins-Minerals (CENTRUM SILVER PO) Take 1 tablet by mouth daily.     Marland Kitchen oxyCODONE (OXY IR/ROXICODONE) 5 MG immediate release tablet Take 1-2 tablets (5-10 mg total) by mouth every 6 (six) hours as needed for severe pain. (Patient not taking: Reported on 12/22/2016) 30 tablet 0   No current facility-administered medications for this encounter.     Physical Findings: The patient is in no acute distress. Patient is alert and oriented.  height is 5\' 6"  (1.676 m) and weight is 246 lb 6.4 oz (111.8 kg). Her oral temperature is 98.3 F (36.8 C). Her blood pressure is 146/75 (abnormal) and her pulse is 99. Her oxygen  saturation is 97%. .  No significant changes. Lungs are clear to auscultation bilaterally. Heart has regular rate and rhythm. No palpable cervical, supraclavicular, or axillary adenopathy. Abdomen soft, non-tender, normal bowel sounds.   Lab Findings: Lab Results  Component Value Date   WBC 6.9 11/19/2016   HGB 11.8 (L) 11/19/2016   HCT 36.1 11/19/2016   MCV 92.3 11/19/2016   PLT 234 11/19/2016    Radiographic Findings: No results found.  Impression:  Stage I-B grade 3 endometrial cancer. The patient is recovering from the effects of radiation.   Plan:  Patient will see Dr. Denman George in 2 months and follow up with radiation oncology in 5 months.   ____________________________________  Blair Promise, PhD, MD This document serves as a record of services personally performed by Gery Pray, MD. It was created on his behalf by Valeta Harms, a trained medical scribe. The creation of this record is based on the scribe's personal observations and the provider's statements to them. This document has been checked and approved by the attending provider.

## 2017-04-18 NOTE — Telephone Encounter (Signed)
Per voicemail from [place] in radiation I have scheduled an appt for 2 months with Dr. Denman George. Contacted [facility] and gave appt for August 13th.

## 2017-04-18 NOTE — Progress Notes (Signed)
  Home Care Instructions for the Insertion and Care of Your Vaginal Dilator  Why Do I Need a Vaginal Dilator?  Internal radiation therapy may cause scar tissue to form at the top of your vagina (vaginal cuff).  This may make vaginal examinations difficult in the future. You can prevent scar tissue from forming by using a vaginal dilator (a smooth plastic rod), and/or by having regular sexual intercourse.  If not using the dilator you should be having intercourse two or three times a week.  If you are unable to have intercourse, you should use your vaginal dilator.  You may have some spotting or bleeding from your dilator or intercourse the first few times. You may also have some discomfort. If discomfort occurs with intercourse, you and your partner may need to stop for a while and try again later.  How to Use Your Vaginal Dilator  - Wash the dilator with soap and water before and after each use. - Check the dilator to be sure it is smooth. Do not use the dilator if you find any roughspots. - Coat the dilator with K-Y Jelly, Astroglide, or Replens. Do not use Vaseline, baby oil, or other oil based lubricants. They are not water-soluble and can be irritating to the tissues in the vagina. - Lie on your back with your knees bent and legs apart. - Insert the rounded end of the dilator into your vagina as far as it will go without causing pain or discomfort. - Close your knees and slowly straighten your legs. - Keep the dilator in your vagina for about 10 to 15 minutes three days week (for example: Monday, Wednesday and Friday evenings). [facility] your knees, open your legs, and gently remove the dilator. - Gently cleanse the skin around the vaginal opening. - Wash the dilator after each use. -  It is important that you use the dilator routinely until instructed otherwise by your doctor.

## 2017-04-18 NOTE — Progress Notes (Signed)
Brianna Herring is here for follow up.  She denies having pain, bladder/bowel issues or vaginal bleeding/discharge.  She reports having a good energy level.  She had been given size XS and XS+ vaginal dilators.  BP (!) 146/75 (BP Location: Right Arm, Patient Position: Sitting)   Pulse 99   Temp 98.3 F (36.8 C) (Oral)   Ht 5\' 6"  (1.676 m)   Wt 246 lb 6.4 oz (111.8 kg)   SpO2 97%   BMI 39.77 kg/m    Wt Readings from Last 3 Encounters:  04/18/17 246 lb 6.4 oz (111.8 kg)  03/02/17 253 lb 3.2 oz (114.9 kg)  02/23/17 248 lb 3.2 oz (112.6 kg)

## 2017-04-18 NOTE — Telephone Encounter (Signed)
CALLED PATIENT TO INFORM OF FU APPT. WITH DR. Denman George ON 06-20-17 @ 2:15 PM, LVM FOR A RETURN CALL

## 2017-06-20 ENCOUNTER — Ambulatory Visit: Payer: BLUE CROSS/BLUE SHIELD | Admitting: Gynecologic Oncology

## 2017-06-21 ENCOUNTER — Telehealth: Payer: Self-pay | Admitting: *Deleted

## 2017-06-21 NOTE — Telephone Encounter (Signed)
Attempted to contact the patient regarding the missed appt on August 13th. Left a message to call the office back.

## 2017-06-23 ENCOUNTER — Telehealth: Payer: Self-pay | Admitting: *Deleted

## 2017-06-23 NOTE — Telephone Encounter (Signed)
Returned patient's call and scheduled follow up appt for August 27th at 3:15pm

## 2017-07-04 ENCOUNTER — Telehealth: Payer: Self-pay | Admitting: *Deleted

## 2017-07-04 ENCOUNTER — Ambulatory Visit: Payer: BLUE CROSS/BLUE SHIELD | Admitting: Gynecologic Oncology

## 2017-07-04 NOTE — Telephone Encounter (Signed)
Patient called and left message to cancel her appt for today. Attempted to contact the patient back, left message to call the office back to reschedule appt.

## 2017-09-19 ENCOUNTER — Ambulatory Visit
Admission: RE | Admit: 2017-09-19 | Discharge: 2017-09-19 | Disposition: A | Discharge status: Home / Self Care | Payer: BLUE CROSS/BLUE SHIELD | Source: Ambulatory Visit | Attending: Radiation Oncology | Admitting: Radiation Oncology

## 2017-09-19 ENCOUNTER — Telehealth: Payer: Self-pay | Admitting: Oncology

## 2017-09-19 DIAGNOSIS — Z923 Personal history of irradiation: Secondary | ICD-10-CM | POA: Insufficient documentation

## 2017-09-19 DIAGNOSIS — C541 Malignant neoplasm of endometrium: Secondary | ICD-10-CM | POA: Insufficient documentation

## 2017-09-19 NOTE — Telephone Encounter (Signed)
Attempted to call patient regarding her appointment today.  There was no answer or voicemail.

## 2018-01-12 IMAGING — CT CT ABD-PELV W/ CM
2 of 5 series · 16 of 46 positions shown, 18 images · IV contrast (ISOVUE)
Comparison: Chest CT performed today and reported separately.

CLINICAL DATA: Newly diagnosed high-grade endometrial carcinoma.

EXAM:
CT ABDOMEN AND PELVIS WITH CONTRAST
TECHNIQUE: Multidetector CT imaging of the abdomen and pelvis was performed
using the standard protocol following bolus administration of
intravenous contrast.
CONTRAST:  75mL MQ3TB9-MDD IOPAMIDOL (MQ3TB9-MDD) INJECTION 61%

[Series 2: abd/pel with · axial · 0.74mm/px · z∈[+1124,+1534]mm · 13 of 96 slices shown, 15 images]
[im 7/96  soft-tissue]
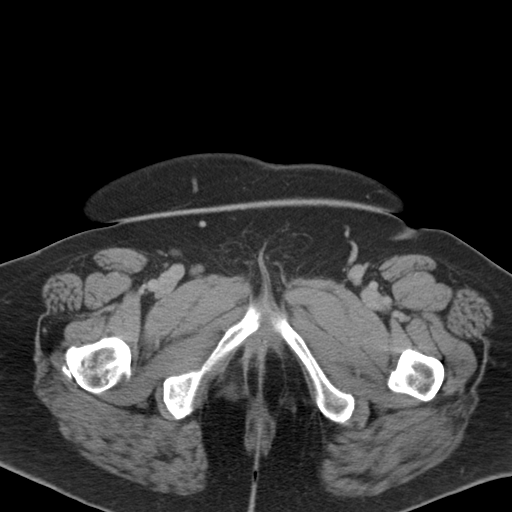
[im 7/96  bone]
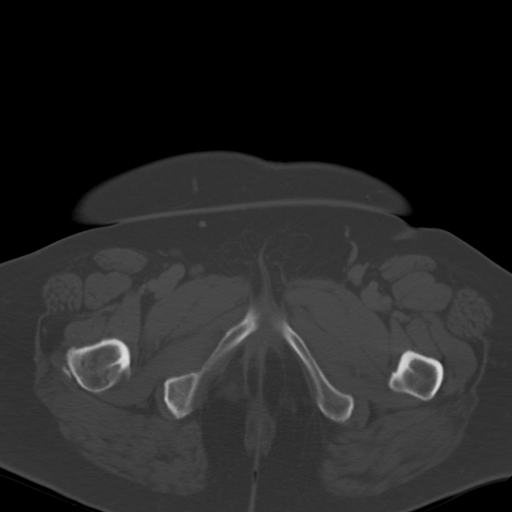
[im 14/96  soft-tissue]
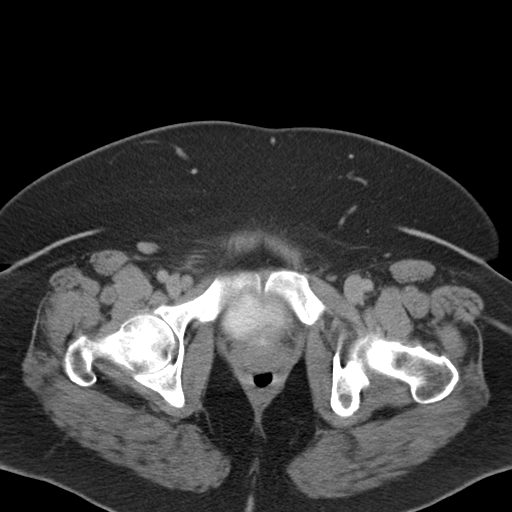
[im 21/96  soft-tissue]
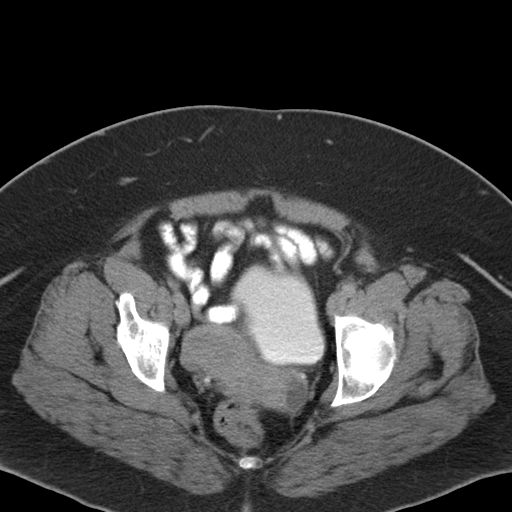
[im 28/96  soft-tissue]
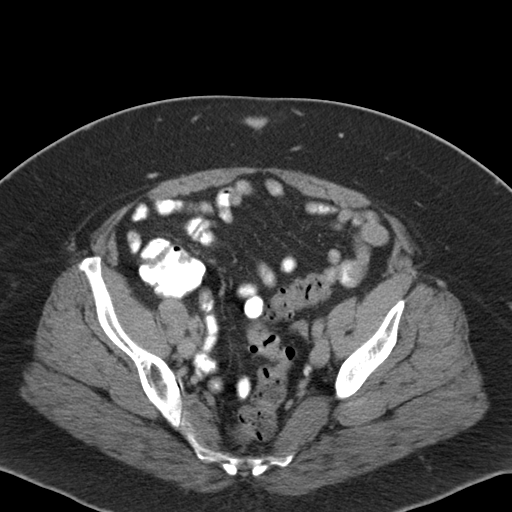
[im 34/96  soft-tissue]
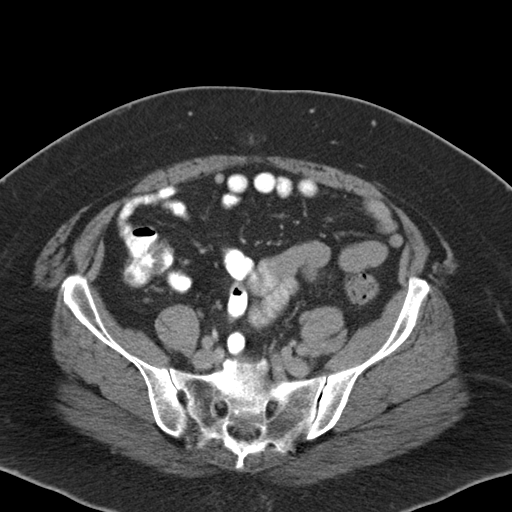
[im 41/96  soft-tissue]
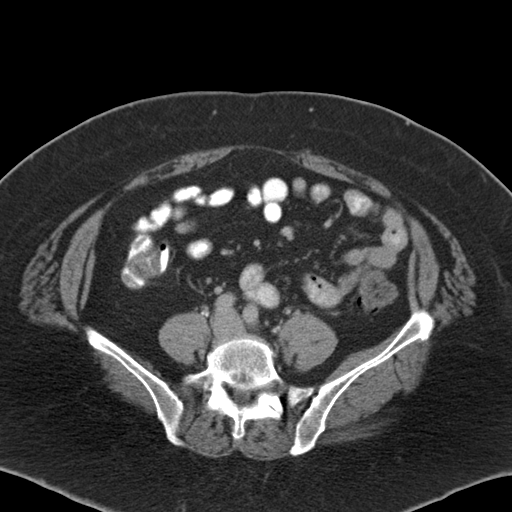
[im 48/96  soft-tissue]
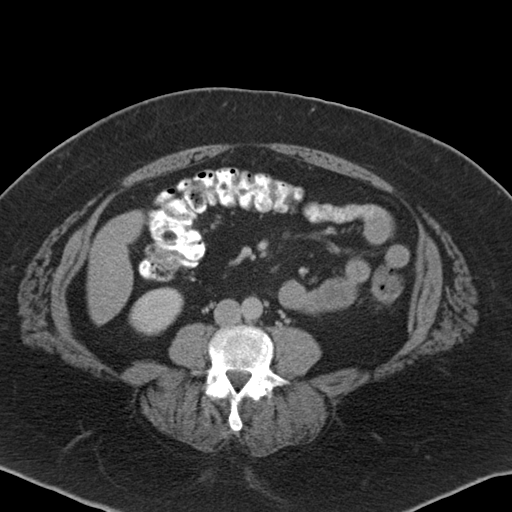
[im 55/96  soft-tissue]
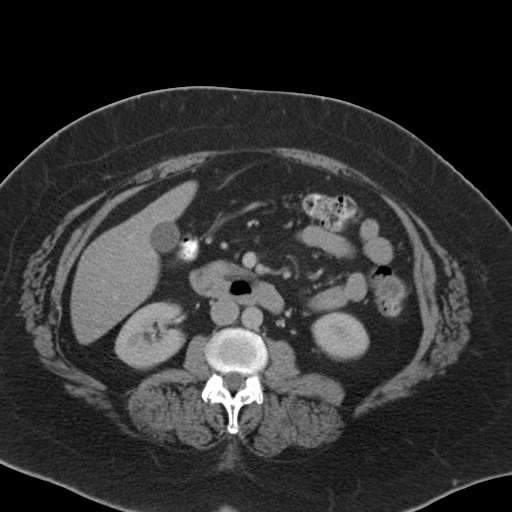
[im 62/96  soft-tissue]
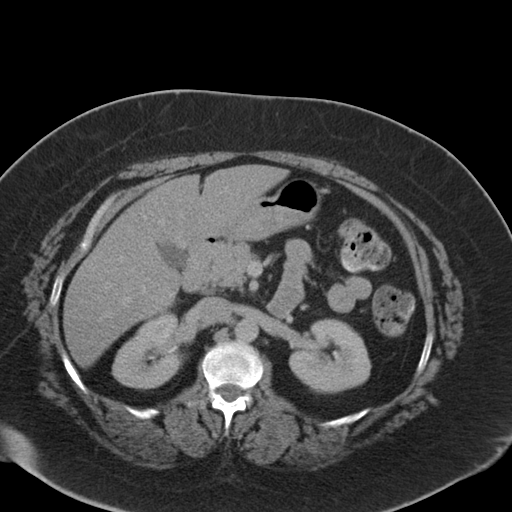
[im 62/96  bone]
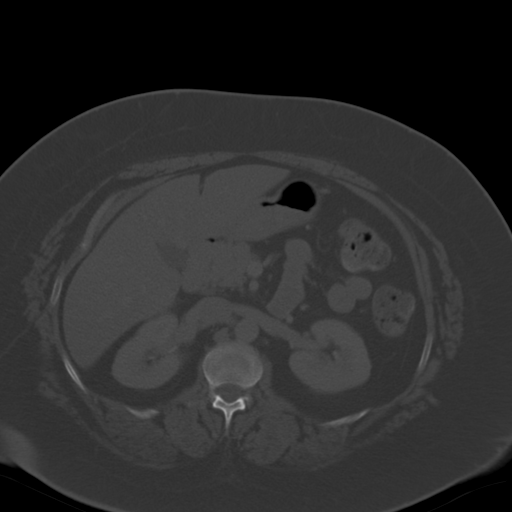
[im 68/96  soft-tissue]
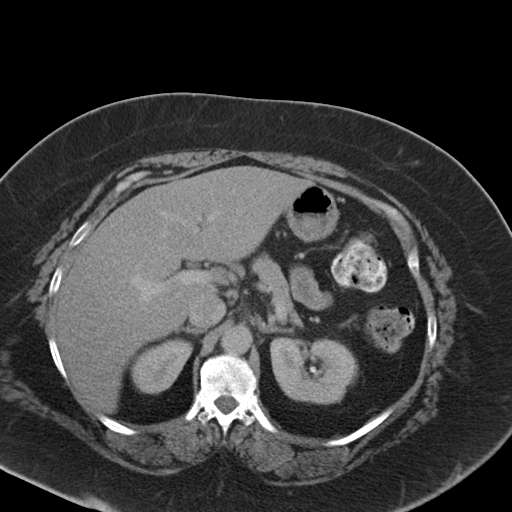
[im 75/96  soft-tissue]
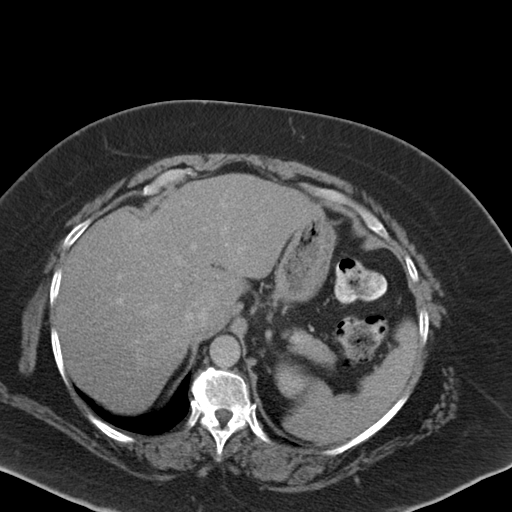
[im 82/96  soft-tissue]
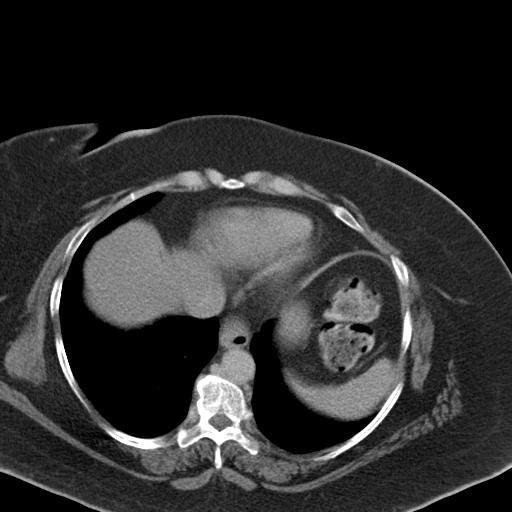
[im 89/96  soft-tissue]
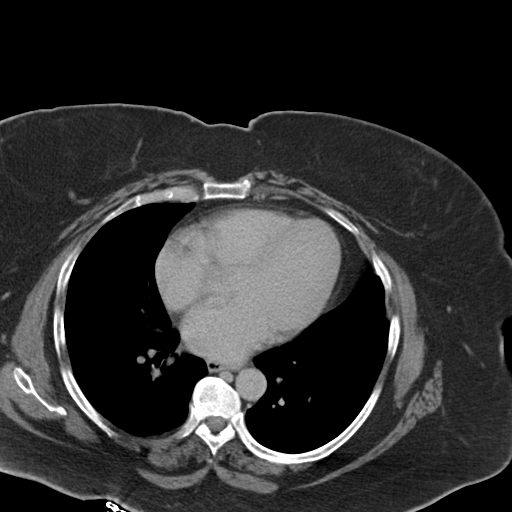

[Series 6: coronal a/|p · coronal · 0.94mm/px · 3 of 191 slices shown]
[im 64/191  soft-tissue]
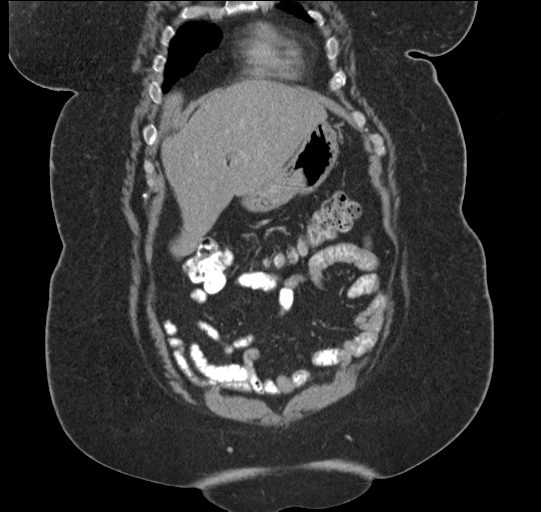
[im 85/191  soft-tissue]
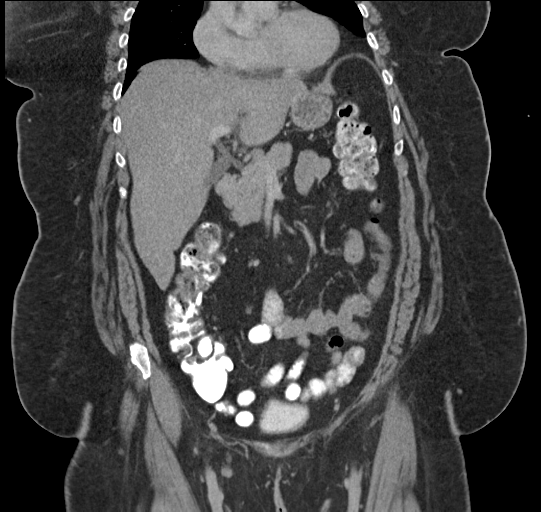
[im 106/191  soft-tissue]
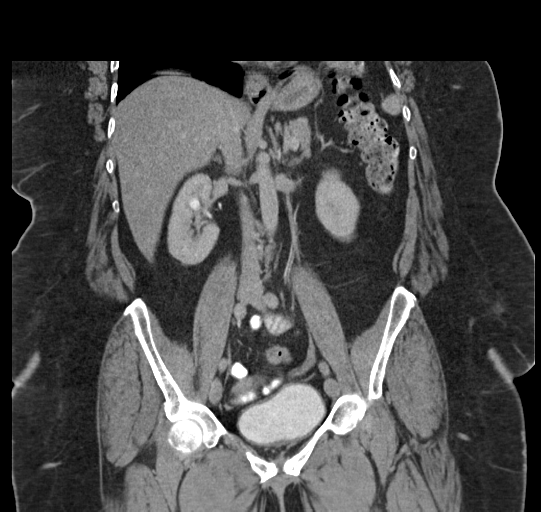

[16 of 46 positions shown; findings below may reference images not displayed]

FINDINGS: Lower Chest: Multiple sub-cm pulmonary nodules in both lung bases,
suspicious for pulmonary metastases.

Hepatobiliary:  No masses identified. Gallbladder is unremarkable.

Pancreas:  No mass or inflammatory changes.

Spleen: Within normal limits in size and appearance.

Adrenals/Urinary Tract: No masses identified. No evidence of
hydronephrosis. 9 mm nonobstructive calculus noted in midpole of
right kidney. Unremarkable urinary bladder.

Stomach/Bowel: No evidence of obstruction, inflammatory process or
abnormal fluid collections.

Vascular/Lymphatic: No pathologically enlarged lymph nodes
identified within the pelvis or abdomen. No abdominal aortic
aneurysm.

Reproductive: Uterus is tilted to the right. Subtle low-attenuation
is seen centrally in the area the endometrial cavity, consistent
with known endometrial carcinoma. No adnexal mass or free fluid
identified.

Other:  None.

Musculoskeletal:  No suspicious bone lesions identified.
IMPRESSION: Subtle low-attenuation in central uterus in area of endometrial
cavity, consistent with known endometrial carcinoma.

No evidence of metastatic disease within the abdomen or pelvis.

Small bibasilar pulmonary nodules, highly suspicious for pulmonary
metastases. See separate chest CT report.

## 2018-01-12 IMAGING — CT CT CHEST W/ CM
2 of 4 series · 15 of 36 positions shown, 18 images · IV contrast (iopamidol)
Comparison: None.

CLINICAL DATA: Endometrial carcinoma diagnosed last month. Evaluate
for thoracic metastatic disease.

EXAM:
CT CHEST WITH CONTRAST
TECHNIQUE: Multidetector CT imaging of the chest was performed during
intravenous contrast administration.
CONTRAST:  1 R5M0GO-AAA IOPAMIDOL (R5M0GO-AAA) INJECTION 61%

[Series 2: axial st · axial · 0.98mm/px · z∈[+1389,+1633]mm · 12 of 144 slices shown, 15 images]
[im 11/144  mediastinal]
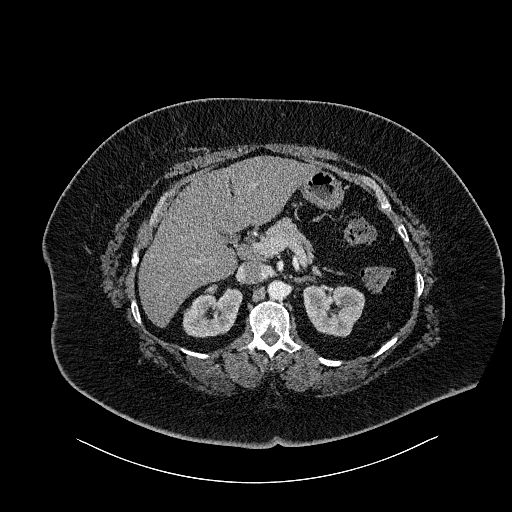
[im 11/144  lung]
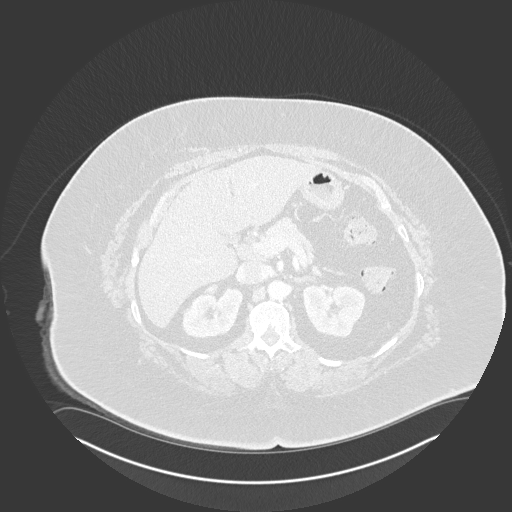
[im 21/144  lung]
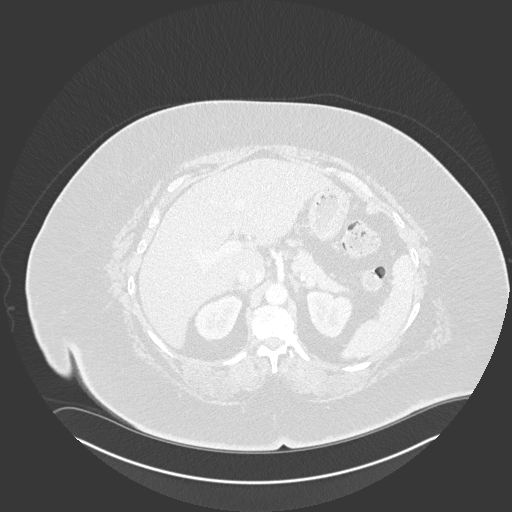
[im 31/144  lung]
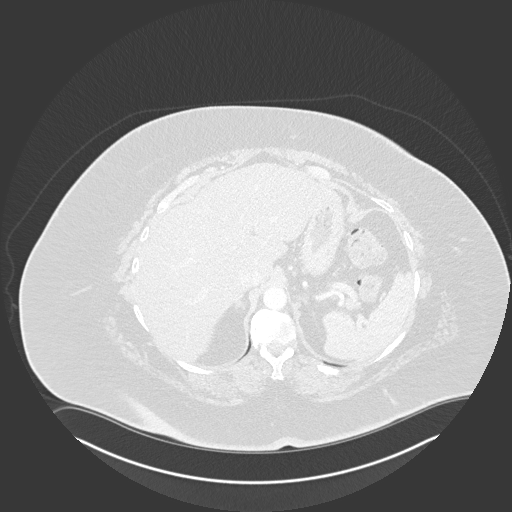
[im 41/144  lung]
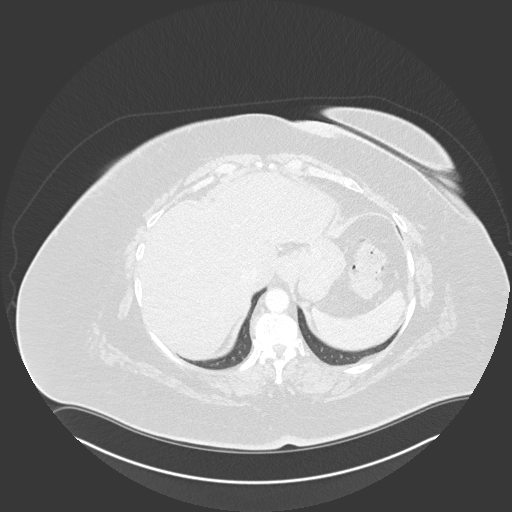
[im 52/144  mediastinal]
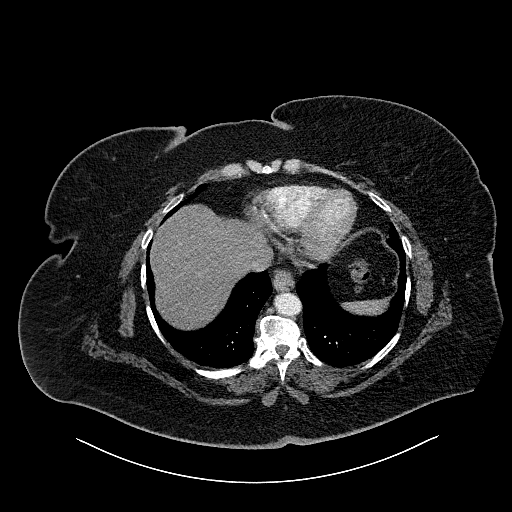
[im 52/144  lung]
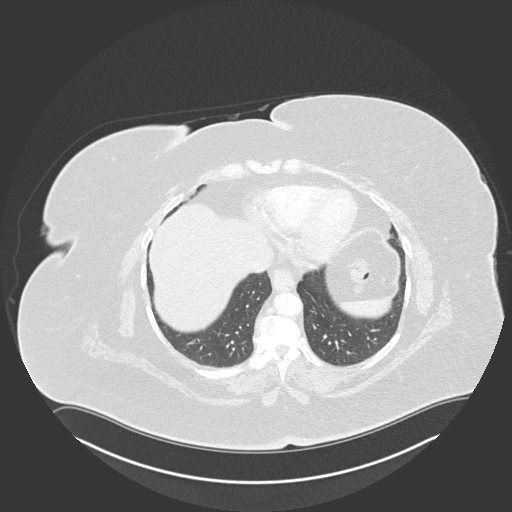
[im 62/144  lung]
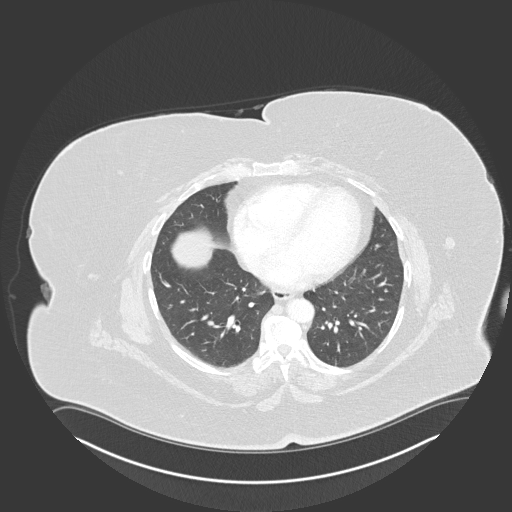
[im 82/144  lung]
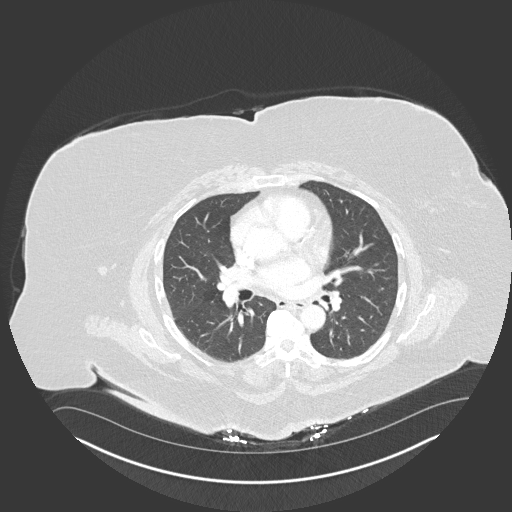
[im 92/144  lung]
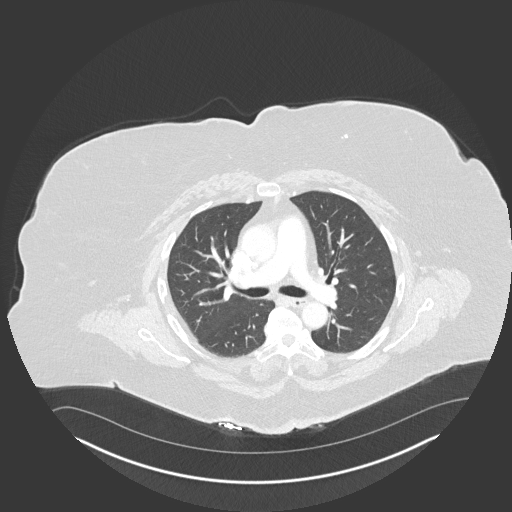
[im 103/144  mediastinal]
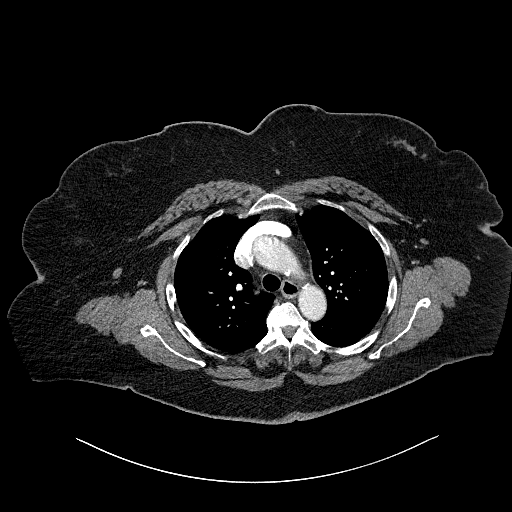
[im 103/144  lung]
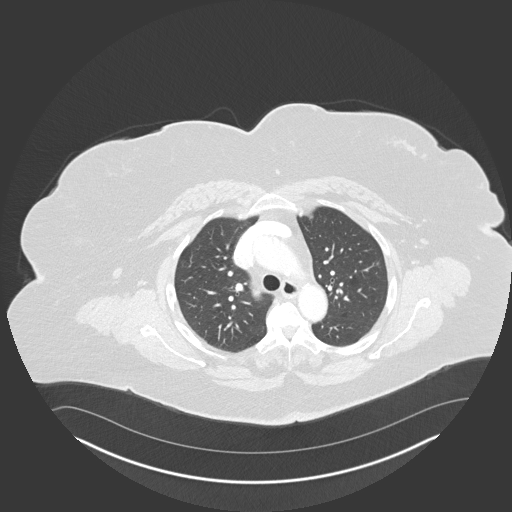
[im 113/144  lung]
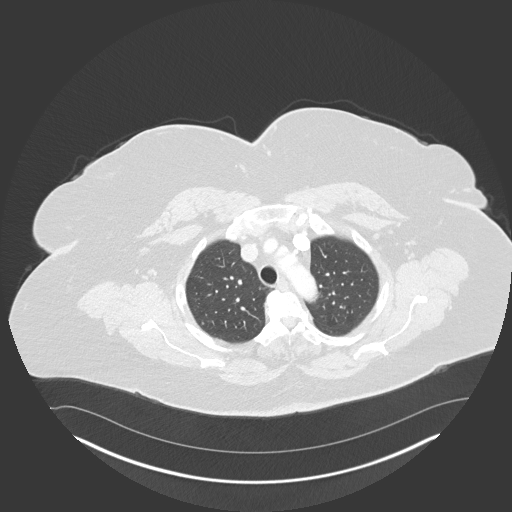
[im 123/144  lung]
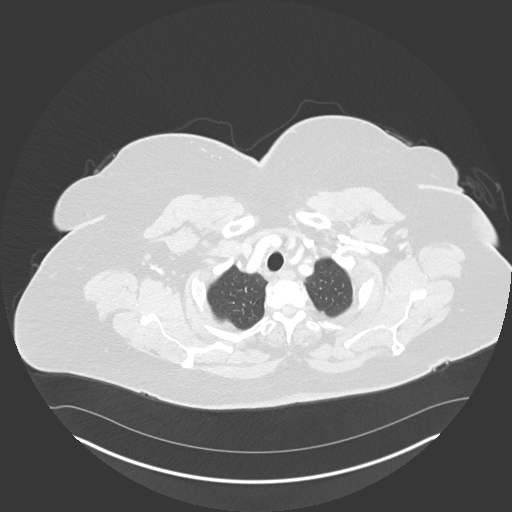
[im 133/144  lung]
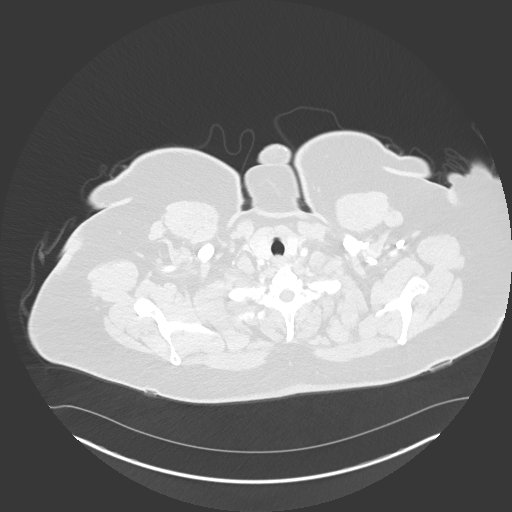

[Series 5: coronal · coronal · 0.57mm/px · 3 of 166 slices shown]
[im 34/166  lung]
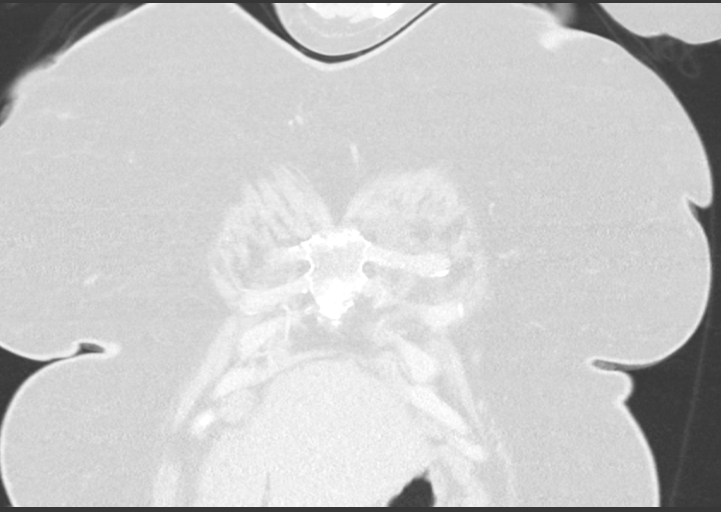
[im 67/166  lung]
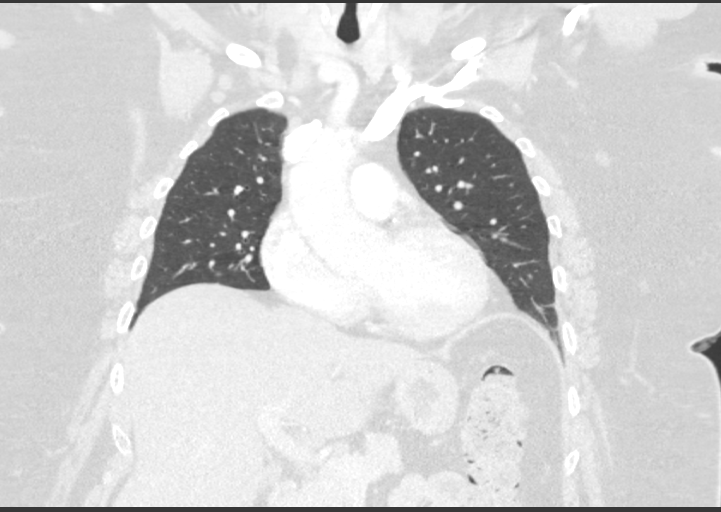
[im 100/166  lung]
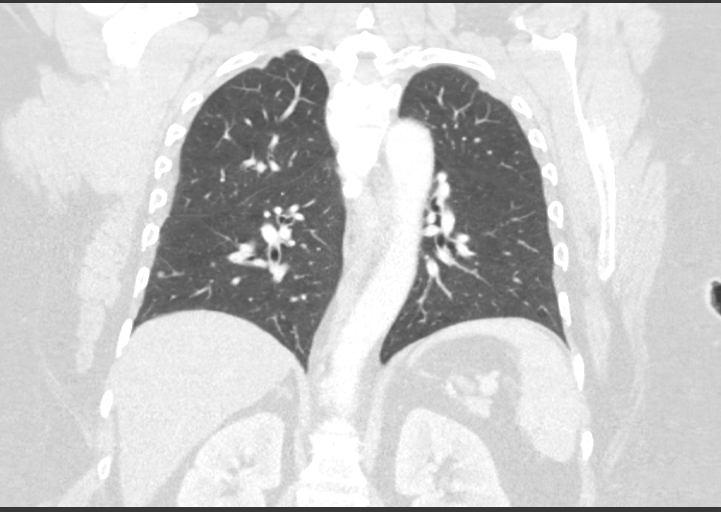

[15 of 36 positions shown; findings below may reference images not displayed]

FINDINGS: Cardiovascular: Mild aortic and coronary artery atherosclerosis. No
acute vascular findings are seen. The heart size is normal. There is
no pericardial effusion.

Mediastinum/Nodes: There are no enlarged mediastinal, hilar or
axillary lymph nodes.There are small axillary lymph nodes
bilaterally. No significant abnormality of the thyroid gland or
trachea seen. There is a small hiatal hernia.

Lungs/Pleura: There is no pleural effusion. Multiple small pulmonary
nodules are present bilaterally, largest and most numerous within
the right lower lobe. The largest nodules measure 6 mm on image 66,
5 mm on image 78 and 6 mm on image 83, all in the right lower lobe.

Upper abdomen: No suspicious findings are seen within the visualized
upper abdomen.

Musculoskeletal/Chest wall: There is no chest wall mass or
suspicious osseous finding.
IMPRESSION: 1. Multiple pulmonary nodules suspicious for metastatic disease.
These are most numerous within the right lower lobe and could be
postinflammatory. Follow-up recommended.
2. No adenopathy or acute findings.
3. Mild atherosclerosis.

## 2018-01-16 ENCOUNTER — Ambulatory Visit: Payer: BLUE CROSS/BLUE SHIELD | Admitting: Gynecologic Oncology

## 2018-01-18 ENCOUNTER — Telehealth: Payer: Self-pay

## 2018-01-18 ENCOUNTER — Encounter: Payer: Self-pay | Admitting: Gynecologic Oncology

## 2018-01-18 ENCOUNTER — Inpatient Hospital Stay: Payer: BLUE CROSS/BLUE SHIELD | Attending: Gynecologic Oncology | Admitting: Gynecologic Oncology

## 2018-01-18 ENCOUNTER — Inpatient Hospital Stay: Payer: BLUE CROSS/BLUE SHIELD

## 2018-01-18 VITALS — BP 148/85 | HR 102 | Temp 98.3°F | Resp 20 | Ht 66.0 in | Wt 243.0 lb

## 2018-01-18 DIAGNOSIS — C541 Malignant neoplasm of endometrium: Secondary | ICD-10-CM | POA: Insufficient documentation

## 2018-01-18 DIAGNOSIS — Z90710 Acquired absence of both cervix and uterus: Secondary | ICD-10-CM | POA: Diagnosis not present

## 2018-01-18 DIAGNOSIS — Z90722 Acquired absence of ovaries, bilateral: Secondary | ICD-10-CM

## 2018-01-18 DIAGNOSIS — Z923 Personal history of irradiation: Secondary | ICD-10-CM | POA: Diagnosis not present

## 2018-01-18 DIAGNOSIS — C801 Malignant (primary) neoplasm, unspecified: Secondary | ICD-10-CM | POA: Insufficient documentation

## 2018-01-18 DIAGNOSIS — R1907 Generalized intra-abdominal and pelvic swelling, mass and lump: Secondary | ICD-10-CM

## 2018-01-18 HISTORY — DX: Malignant (primary) neoplasm, unspecified: C80.1

## 2018-01-18 LAB — BASIC METABOLIC PANEL
Anion gap: 9 (ref 3–11)
BUN: 8 mg/dL (ref 7–26)
CHLORIDE: 102 mmol/L (ref 98–109)
CO2: 29 mmol/L (ref 22–29)
CREATININE: 0.79 mg/dL (ref 0.60–1.10)
Calcium: 9.8 mg/dL (ref 8.4–10.4)
GFR calc non Af Amer: >60 mL/min (ref >60)
GLUCOSE: 91 mg/dL (ref 70–140)
Potassium: 3.9 mmol/L (ref 3.5–5.1)
Sodium: 140 mmol/L (ref 136–145)

## 2018-01-18 NOTE — Progress Notes (Signed)
Consult Note: Gyn-Onc  Consult was requested by Dr. Alfonse Spruce for the evaluation of [place] 62 y.o. female  CC:  Chief Complaint  Patient presents with  . Endometrial cancer [facility])    Assessment/Plan:  [name]  is a 62 y.o.  year old with stage IB high grade endometrial cancer.  High/intermediate uterine risk factors. S/p whole pelvic RT and vaginal brachytherapy (completed May, 2018)  PET non-avid chest nodules and iliac nodules - recommend follow-up CT chest, abdo/pelvis.  MSI unstable - recommend genetics referral - have reordered this.  Follow-up with me in 3 months.   HPI: [place] Her[place] is a 62 year old G0 who is seen in consultation at the request of Dr Alfonse Spruce for high grade endometrial cancer.  She is morbidly obese with a BMI of 44kg/m2.  She developed postmenopausal bleeding in September, 2017 and was evaluated by Dr Alfonse Spruce in November, 2017 when a TVUS was performed (09/13/16) which showed a uterus measuring 6.6x4.7x3.1cm with a thickened ES of 76m.  Due to her narrow vagina and obesity, office sampling was unsuccessful.  She was taken to the OR on 10/06/16 by Dr NAlfonse Sprucefor a D&C with the pathology revealing high grade endometrial cancer (grade 3).  Preoperative CT scan of the chest, abdo, pelvis on 11/15/16 showed multiple pulmonary nodules suspicious for metastatic disease. These are most numerous within the right lower lobe and could be postinflammatory..  On 11/18/16 she was taken to the OR for a robotic assisted total hysterectomy, BSO, SLN biopsy.  Final pathology showed a grade 3 endometrioid endometrial adenocarcinoma with a 4cm tumor, 1.8 of 2.3cm (80%) myometrial invasion with LVSI present. This constituted high risk features for recurrence and adjuvant radiation was recommended to reduce local recurrence risk in accordance with NCCN guidelines. 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  Postoperatively she did well with no issues.   Interval Hx:  Due to the findings of preoperative nodules in the lung, she had a postop PET/CT on December 10, 2016.  This showed borderline prominent portacaval and left inguinal lymph nodes not appreciably hypermetabolic.  A 5 mm right lower lobe pulmonary nodule below sensitive PET/CT thresholds surveillance recommended.  Right external iliac and pelvic sidewall regions with some soft tissue densities in that region follow-up recommended.  She went on to receive vaginal brachitherapy with Dr. KRanda Ngobetween March 02, 2017 and Mar 16, 2017 for high intermediate risk factor endometrial cancer.  She tolerated therapy well.    She had minimal follow-up initially as she was caring for her sick mother.  She was recommended and referred to see genetics for her MSI tumor however she did not follow through with this appointment.   Current Meds:  Outpatient Encounter Medications as of 01/18/2018  Medication Sig  . calcium carbonate (CALTRATE 600) 1500 (600 Ca) MG TABS tablet Take 1,500 tablets by mouth 2 (two) times daily.   . Multiple Vitamins-Minerals (CENTRUM SILVER PO) Take 1 tablet by mouth daily.   . [DISCONTINUED] oxyCODONE (OXY IR/ROXICODONE) 5 MG immediate release tablet Take 1-2 tablets (5-10 mg total) by mouth every 6 (six) hours as needed for severe pain. (Patient not taking: Reported on 12/22/2016)   No facility-administered encounter medications on file as of 01/18/2018.     Allergy: No Known Allergies  Social Hx:   Social History   Socioeconomic History  . Marital status: Single    Spouse name: Not on file  .  Number of children: 0  . Years of education: Not on file  . Highest education level: Not on file  Social Needs  . Financial resource strain: Not on file  . Food insecurity - worry: Not on file  . Food insecurity - inability: Not on file  . Transportation needs - medical: Not on file  . Transportation needs -  non-medical: Not on file  Occupational History  . Occupation: sewer  Tobacco Use  . Smoking status: Never Smoker  . Smokeless tobacco: Never Used  Substance and Sexual Activity  . Alcohol use: No  . Drug use: No  . Sexual activity: No  Other Topics Concern  . Not on file  Social History Narrative  . Not on file    Past Surgical Hx:  Past Surgical History:  Procedure Laterality Date  . NO PAST SURGERIES    . ROBOTIC ASSISTED TOTAL HYSTERECTOMY WITH BILATERAL SALPINGO OOPHERECTOMY N/A 11/18/2016   Procedure: XI ROBOTIC ASSISTED TOTAL HYSTERECTOMY WITH BILATERAL SALPINGO OOPHORECTOMY;  Surgeon: Everitt Amber, MD;  Location: [facility];  Service: Gynecology;  Laterality: N/A;  . SENTINEL NODE BIOPSY N/A 11/18/2016   Procedure: SENTINEL NODE BIOPSY;  Surgeon: Everitt Amber, MD;  Location: [facility];  Service: Gynecology;  Laterality: N/A;    Past Medical Hx:  Past Medical History:  Diagnosis Date  . Cancer (HCC)    endometrial  . Headache   . History of radiation therapy 03/02/17-03/16/17   vaginal cuff treated to 18 Gy with 3 fractions of 6 Gy  . Localized swelling of both lower legs    wears compression hose  . Phlebitis alone left leg  4-5 yrs ago    Past Gynecological History:  G0 No LMP recorded. Patient has had a hysterectomy.  Family Hx:  Family History  Problem Relation Age of Onset  . Lung cancer Father   . Breast cancer Sister     Review of Systems:  Constitutional  Feels well,    ENT Normal appearing ears and nares bilaterally Skin/Breast  No rash, sores, jaundice, itching, dryness Cardiovascular  No chest pain, shortness of breath, or edema  Pulmonary  No cough or wheeze.  Gastro Intestinal  No nausea, vomitting, or diarrhoea. No bright red blood per rectum, no abdominal pain, change in bowel movement, or constipation.  Genito Urinary  No frequency, urgency, dysuria, no postmenopausal bleeding Musculo Skeletal  No myalgia, arthralgia, joint swelling or pain   Neurologic  No weakness, numbness, change in gait,  Psychology  No depression, anxiety, insomnia.   Vitals:  Blood pressure (!) 148/85, pulse (!) 102, temperature 98.3 F (36.8 C), temperature source Oral, resp. rate 20, height '5\' 6"'  (1.676 m), weight 243 lb (110.2 kg), SpO2 100 %.  Physical Exam: WD in NAD Neck  Supple NROM, without any enlargements.  Lymph Node Survey No cervical supraclavicular or inguinal adenopathy Cardiovascular  Pulse normal rate, regularity and rhythm. S1 and S2 normal.  Lungs  Clear to auscultation bilateraly, without wheezes/crackles/rhonchi. Good air movement.  Skin  No rash/lesions/breakdown  Psychiatry  Alert and oriented to person, place, and time  Abdomen  Normoactive bowel sounds, abdomen soft, non-tender and obese without evidence of hernia.  Back No CVA tenderness Genito Urinary  Vulva/vagina: Normal external female genitalia.   No lesions. No discharge or bleeding.  Bladder/urethra:  No lesions or masses, well supported bladder  Vagina: long vagina, difficult to see or reach cuff. No blood. No apparent dehiscence. No lesions.  Cervix & uterus: surgically  absent.   Rectal  deferred Extremities  No bilateral cyanosis, clubbing or edema.    Thereasa Solo, MD  01/18/2018, 4:18 PM

## 2018-01-18 NOTE — Telephone Encounter (Signed)
Epic was down so pt went to lab area and was instructed not to leave while we tried to get her appts scheduled. Pt left from lab dept before getting f/u appointment and CT appointment information.  Attempted to reach pt on her cell number, no answer, no option to leave VM.  Left VM on her home number with our contact info.

## 2018-01-18 NOTE — Patient Instructions (Signed)
Please notify Dr Denman George at phone number [phone] if you notice vaginal bleeding, new pelvic or abdominal pains, bloating, feeling full easy, or a change in bladder or bowel function.   Please return for the CT scan that Dr Denman George has ordered to monitor the chest nodules.  Please return to see the genetics counselor as scheduled to see if you carry a cancer gene.  Please return to see Dr Denman George in 3 months.

## 2018-01-23 ENCOUNTER — Encounter (HOSPITAL_COMMUNITY): Payer: Self-pay

## 2018-01-23 ENCOUNTER — Ambulatory Visit (HOSPITAL_COMMUNITY): Admission: RE | Admit: 2018-01-23 | Payer: BLUE CROSS/BLUE SHIELD | Source: Ambulatory Visit

## 2018-01-24 ENCOUNTER — Telehealth: Payer: Self-pay | Admitting: Gynecologic Oncology

## 2018-01-24 NOTE — Telephone Encounter (Signed)
Scheduled appt per sch message -  Genetic referral per Dr. Denman George - left message with appt date and time.

## 2018-01-25 ENCOUNTER — Telehealth: Payer: Self-pay | Admitting: Gynecologic Oncology

## 2018-01-25 NOTE — Telephone Encounter (Signed)
Returned call to patient.  Informed of BMET results.  Wanting her CT in [place].  Stating she may want to wait on genetics appt.  She will let our office know.

## 2018-01-26 ENCOUNTER — Telehealth: Payer: Self-pay | Admitting: *Deleted

## 2018-01-26 NOTE — Telephone Encounter (Signed)
Left a message for the patient to cll the office regarding appts. Left detailed message with date/time of scan on March 26th at [facility]

## 2018-01-27 NOTE — Telephone Encounter (Signed)
Gave appointment date 01-31-18 1615 for CT C/A/P at [facility] arrive at 1545. Instructed her to pick up oral contrast and instructions at radiology dept in [facility] by 01-30-18 evening for 01-31-18 scan.

## 2018-01-30 ENCOUNTER — Telehealth: Payer: Self-pay | Admitting: *Deleted

## 2018-01-30 NOTE — Telephone Encounter (Signed)
Faxed CT orders to [facility]. Gave BMET results over the phone to the CT dep[t

## 2018-02-07 ENCOUNTER — Telehealth: Payer: Self-pay | Admitting: Gynecologic Oncology

## 2018-02-07 NOTE — Telephone Encounter (Signed)
Informed patient of results of CT chest abdomen and pelvis which showed multiple pulmonary nodules consistent with recurrent disease.   Discussed that my recommendation is for chemotherapy with carboplatin and taxol.   Offered treatment in [place] vs [place].   Patient elects for treatment locally in [place]. Will schedule appointment with Dr Hinton Rao. Patient prefers later in the day appointments.  Thereasa Solo, MD

## 2018-02-08 ENCOUNTER — Ambulatory Visit: Payer: BLUE CROSS/BLUE SHIELD | Admitting: Gynecologic Oncology

## 2018-02-09 ENCOUNTER — Telehealth: Payer: Self-pay | Admitting: *Deleted

## 2018-02-09 NOTE — Telephone Encounter (Signed)
Faxed records to Dr.McCarty's office

## 2018-02-20 ENCOUNTER — Telehealth: Payer: Self-pay

## 2018-02-20 DIAGNOSIS — C7800 Secondary malignant neoplasm of unspecified lung: Secondary | ICD-10-CM | POA: Diagnosis not present

## 2018-02-20 DIAGNOSIS — Z923 Personal history of irradiation: Secondary | ICD-10-CM | POA: Diagnosis not present

## 2018-02-20 DIAGNOSIS — C541 Malignant neoplasm of endometrium: Secondary | ICD-10-CM | POA: Diagnosis not present

## 2018-02-20 NOTE — Telephone Encounter (Signed)
Received VM from [place] with Dr Dot [facility] regarding whether pt has had any genetic testing.  Did not see any results with genetics, called Carla back [phone]- I let her know pt has appt with genetic counseling here on 03-06-2018.  No other needs per Angela Nevin with Dr Hinton Rao at this time.

## 2018-03-02 ENCOUNTER — Telehealth: Payer: Self-pay | Admitting: Pulmonary Disease

## 2018-03-02 NOTE — Telephone Encounter (Signed)
lmtcb for Angela Nevin at [facility] Per Magda Paganini, pt can be scheduled on MW's schedule 03/14/18 in a 15 minute slot, and hold a later 15 min slot to give them catch-up time.

## 2018-03-02 NOTE — Telephone Encounter (Signed)
Pt was scheduled in the 11:15 slot on 03/14/2018. The 11:45  put on hold.

## 2018-03-02 NOTE — Telephone Encounter (Signed)
Did make an appt for May 29 with Mannam just incase a sooner one is not available

## 2018-03-02 NOTE — Telephone Encounter (Signed)
Noted- thanks Danae Chen!  Will close encounter.

## 2018-03-06 ENCOUNTER — Telehealth: Payer: Self-pay | Admitting: Genetics

## 2018-03-06 ENCOUNTER — Encounter: Payer: BLUE CROSS/BLUE SHIELD | Admitting: Genetics

## 2018-03-06 ENCOUNTER — Telehealth: Payer: Self-pay | Admitting: *Deleted

## 2018-03-06 NOTE — Telephone Encounter (Signed)
Patient called and left a message that she needed to cancel her genetics appt for today. Appt cancelled and notified the genetics department.   Called, left the patient a message to call the office and let us know if she wants to reschedule the appt

## 2018-03-06 NOTE — Telephone Encounter (Signed)
told patient I wanted to talk to her about upcoming genetics appointment today. left my contact information

## 2018-03-13 ENCOUNTER — Telehealth: Payer: Self-pay | Admitting: Internal Medicine

## 2018-03-13 NOTE — Telephone Encounter (Signed)
Called and spoke to pt. Pt is questioning if we received her scans from [facility] as she has a consults appt on 5.7.2019 with MW. Pt's scans are in the PACS system and we are able to view them. Advised pt of this. Pt verbalized understanding and denied any further questions or concerns at this time.

## 2018-03-14 ENCOUNTER — Encounter: Payer: Self-pay | Admitting: Internal Medicine

## 2018-03-14 ENCOUNTER — Ambulatory Visit (INDEPENDENT_AMBULATORY_CARE_PROVIDER_SITE_OTHER): Payer: BLUE CROSS/BLUE SHIELD | Admitting: Internal Medicine

## 2018-03-14 VITALS — BP 126/80 | HR 95

## 2018-03-14 DIAGNOSIS — R918 Other nonspecific abnormal finding of lung field: Secondary | ICD-10-CM | POA: Insufficient documentation

## 2018-03-14 DIAGNOSIS — R911 Solitary pulmonary nodule: Secondary | ICD-10-CM | POA: Diagnosis not present

## 2018-03-14 HISTORY — DX: Other nonspecific abnormal finding of lung field: R91.8

## 2018-03-14 NOTE — Patient Instructions (Addendum)
Please see patient coordinator before you leave today  to schedule PET scan and I will be in touch to help you decide whether a lung biopsy will help sort out your problem

## 2018-03-14 NOTE — Progress Notes (Signed)
Subjective:     Patient ID: [place], female   DOB: October 17, 1956,    MRN: [id number]  HPI   51 yobf   never smoker with no respiratory complaints with incidental mpns at w/u for endometrial ca in early Jan 2018 s/p RT locally but no chemo.   Admit date: 11/18/2016 Discharge date: 11/19/2016  Admission Diagnoses: Endometrial adenocarcinoma [facility])  Discharge Diagnoses:  Principal Problem:   Endometrial adenocarcinoma ([place]) with neg nodes so Stage I B     Hospital Course: On 11/18/2016, the patient underwent the following: Procedure(s): XI ROBOTIC ASSISTED TOTAL HYSTERECTOMY WITH BILATERAL SALPINGO OOPHORECTOMY SENTINEL NODE BIOPSY.   The postoperative course was uneventful.  She was discharged to home on postoperative day 1 tolerating a regular diet, voiding, pain controlled.    03/14/2018 1st [facility] office visit/ Fleur Audino  Re mps Chief Complaint  Patient presents with  . Pulmonary Consult    Referred by Dr. Hosie Poisson for eval of pulmonary nodules. She denies any respiratory co's.   still no resp symptoms at all   Of note sister had breast ca with nodules and eval at [facility] and found to have sarcoid.  No obvious day to day or daytime variability or assoc excess/ purulent sputum or mucus plugs or hemoptysis or cp or chest tightness, subjective wheeze or overt sinus or hb symptoms. No unusual exposure hx or h/o childhood pna/ asthma or knowledge of premature birth.  Sleeping  ok  without nocturnal  or early am exacerbation  of respiratory  c/o's or need for noct saba. Also denies any obvious fluctuation of symptoms with weather or environmental changes or other aggravating or alleviating factors except as outlined above   Current Allergies, Complete Past Medical History, Past Surgical History, Family History, and Social History were reviewed in [employer] record.  ROS  The following are not active complaints unless bolded Hoarseness, sore  throat, dysphagia, dental problems, itching, sneezing,  nasal congestion or discharge of excess mucus or purulent secretions, ear ache,   fever, chills, sweats, unintended wt loss(intentional only)  or wt gain, classically pleuritic or exertional cp,  orthopnea pnd or arm/hand swelling  or leg swelling, presyncope, palpitations, abdominal pain, anorexia, nausea, vomiting, diarrhea  or change in bowel habits or change in bladder habits, change in stools or change in urine, dysuria, hematuria,  rash, arthralgias, visual complaints, headache, numbness, weakness or ataxia or problems with walking or coordination,  change in mood or  memory.        Current Meds  Medication Sig  . calcium carbonate (CALTRATE 600) 1500 (600 Ca) MG TABS tablet Take 1,500 tablets by mouth 2 (two) times daily.   . Multiple Vitamins-Minerals (CENTRUM SILVER PO) Take 1 tablet by mouth daily.                     Review of Systems     Objective:   Physical Exam    obese amb bf nad  Wt Readings from Last 3 Encounters:  01/18/18 243 lb (110.2 kg)  04/18/17 246 lb 6.4 oz (111.8 kg)  03/02/17 253 lb 3.2 oz (114.9 kg)     Vital signs reviewed - Note on arrival 02 sats  95% on RA     HEENT: nl dentition, turbinates bilaterally, and oropharynx. Nl external ear canals without cough reflex   NECK :  without JVD/Nodes/TM/ nl carotid upstrokes bilaterally   LUNGS: no acc muscle use,  Nl contour chest which  is clear to A and P bilaterally without cough on insp or exp maneuvers   CV:  RRR  no s3 or murmur or increase in P2, and no edema   ABD:  Obese/ soft and nontender with nl inspiratory excursion in the supine position. No bruits or organomegaly appreciated, bowel sounds nl  MS:  Nl gait/ ext warm without deformities, calf tenderness, cyanosis or clubbing No obvious joint restrictions   SKIN: warm and dry without lesions    NEURO:  alert, approp, nl sensorium with  no motor or cerebellar deficits  apparent.     I personally reviewed images and agree with radiology impression as follows:   Chest CT  01/31/18  C/w MPN's, mulitiple new/ enlarging one of which is cavitary in LLL    Assessment:

## 2018-03-15 ENCOUNTER — Encounter: Payer: Self-pay | Admitting: Internal Medicine

## 2018-03-15 NOTE — Assessment & Plan Note (Signed)
CT 11/15/16  MPN's largest 6 mm - PET 12/15/16 neg but largest < 22m - CT chest 01/31/18  Largest ?RML (center of chest on lateral view) @ 1.8 x 1.5 and LLL cavitary 1.5 x1.4  - PET ordered   ddx of widespread asymptomatic progressive nodularity over 14 months is either metastatic or granulomatous process and needs tissue dx vs empirical rx for met ca with pt / sister leaning toward bx based on sisters "misdx" that she says turned out to be sarcoid.  Given that the primary was I B this seems reasonable though I very strongly favor met ca here.  Discussed in detail all the  indications, usual  risks and alternatives  relative to the benefits with patient who agrees to proceed with pet then bx either navigational or CT directed p I have a chance to review with colleagues and IR   Total time devoted to counseling  > 50 % of initial 60 min office visit:  review extensive case with pt/sister discussion of options/alternatives/ personally creating written customized instructions  in presence of pt  then going over those specific  Instructions directly with the pt including how to use all of the meds but in particular covering each new medication in detail and the difference between the maintenance= "automatic" meds and the prns using an action plan format for the latter (If this problem/symptom => do that organization reading Left to right).  Please see AVS from this visit for a full list of these instructions which I personally wrote for this pt and  are unique to this visit.

## 2018-03-21 ENCOUNTER — Telehealth: Payer: Self-pay | Admitting: *Deleted

## 2018-03-21 NOTE — Telephone Encounter (Signed)
Called and left the patient a message to all the office back. Need to see if the patient wants to reschedule her cancelled genetics appt

## 2018-03-22 ENCOUNTER — Telehealth: Payer: Self-pay | Admitting: *Deleted

## 2018-03-22 NOTE — Telephone Encounter (Signed)
Called and left the patient a message to call the office. Need to move appt from June 5th to another day

## 2018-03-23 ENCOUNTER — Telehealth: Payer: Self-pay | Admitting: *Deleted

## 2018-03-23 NOTE — Telephone Encounter (Signed)
Called and left the patient a message to call the office back. Need to move the appt from June 5th to another day

## 2018-03-24 ENCOUNTER — Telehealth: Payer: Self-pay | Admitting: Internal Medicine

## 2018-03-24 NOTE — Telephone Encounter (Signed)
Called [place] on ext given, unable to reach. Left message to give [place] a call back.   Called again and was directed to the nurses station. Unable to reach anyone, left another message to give [place] a call back in regards to this patient.

## 2018-03-27 NOTE — Telephone Encounter (Signed)
Bedrest levaquin 500 mg daily x 7 days

## 2018-03-27 NOTE — Telephone Encounter (Signed)
Spoke with Angela Nevin at [facility] [facility], faxed PET report as requested.  Angela Nevin is asking if pt is to have a biopsy.  I advised that pt has not been scheduled for a biopsy.  MW please clarify if pt needs a biopsy based on 5/15 PET.  Thanks! (PET was done at [facility], available in PACS)

## 2018-03-28 ENCOUNTER — Encounter: Payer: Self-pay | Admitting: *Deleted

## 2018-03-28 ENCOUNTER — Telehealth: Payer: Self-pay | Admitting: *Deleted

## 2018-03-28 NOTE — Telephone Encounter (Signed)
Called and left the patient a message to call the office back. Need to cancel the 6/5 appt and move appt. Appt cancelled

## 2018-03-28 NOTE — Telephone Encounter (Signed)
Dr. Melvyn Novas was the bedrest and antibiotic an error?  This call was not initiated by the patient.  MW please clarify if pt needs a biopsy based on 5/15 PET.  Thanks! (PET was done at [facility], available in PACS)

## 2018-03-29 NOTE — Telephone Encounter (Signed)
Let them know I have tried to reach her multiple times and left message to call me back/ interrupt me and I'll go over recs but basically   1) no real need for tissue dx in my opinion or [facility]  2) IR willing to try to bx but may be technically difficult if pt insists on a tissue dx here  (Dr Owens Shark agreed to it )

## 2018-03-29 NOTE — Telephone Encounter (Signed)
LMTCB

## 2018-03-30 ENCOUNTER — Telehealth: Payer: Self-pay | Admitting: Internal Medicine

## 2018-03-30 NOTE — Telephone Encounter (Signed)
Discussed with pt and advised Dr Hinton Rao and I feel it's very likely met ca and no definite need for bx here and technically difficult to reach but Dr Pascal Lux willing to try > Discussed in detail all the  indications, usual  risks and alternatives  relative to the benefits with patient who agrees to proceed with rx per Dr Hinton Rao and see if responds and if not then bx can be attempted.

## 2018-03-30 NOTE — Telephone Encounter (Signed)
Spoke with pt, she is requesting advice from MW about her next steps. She states she has a pulmonary nodule and wanted to know if she was going to need biopsy? She had her CT scan done at [facility]. Please advise.   Assessment & Plan Note by Tanda Rockers, MD at 03/15/2018 5:46 AM  Author: Tanda Rockers, MD Author Type: Physician Filed: 03/15/2018 5:49 AM  Note Status: Written Cosign: Cosign Not Required Encounter Date: 03/14/2018  Problem: Multiple lung nodules on CT  Editor: Tanda Rockers, MD (Physician)    CT 11/15/16  MPN's largest 6 mm - PET 12/15/16 neg but largest < 73m - CT chest 01/31/18  Largest ?RML (center of chest on lateral view) @ 1.8 x 1.5 and LLL cavitary 1.5 x1.4  - PET ordered   ddx of widespread asymptomatic progressive nodularity over 14 months is either metastatic or granulomatous process and needs tissue dx vs empirical rx for met ca with pt / sister leaning toward bx based on sisters "misdx" that she says turned out to be sarcoid.  Given that the primary was I B this seems reasonable though I very strongly favor met ca here.  Discussed in detail all the  indications, usual  risks and alternatives  relative to the benefits with patient who agrees to proceed with pet then bx either navigational or CT directed p I have a chance to review with colleagues and IR   Total time devoted to counseling  > 50 % of initial 60 min office visit:  review extensive case with pt/sister discussion of options/alternatives/ personally creating written customized instructions  in presence of pt  then going over those specific  Instructions directly with the pt including how to use all of the meds but in particular covering each new medication in detail and the difference between the maintenance= "automatic" meds and the prns using an action plan format for the latter (If this problem/symptom => do that organization reading Left to right).  Please see AVS from this visit for a full list of  these instructions which I personally wrote for this pt and  are unique to this visit.

## 2018-03-30 NOTE — Telephone Encounter (Signed)
Angela Nevin, Dr. Hinton Rao, [facility] returning call. Cb is [phone] ext [phone] Per Angela Nevin, ok to leave msg on voicemail.

## 2018-03-30 NOTE — Telephone Encounter (Signed)
Spoke with Angela Nevin at [facility]. She is aware of Dr. Gustavus Bryant response. Nothing further was needed.

## 2018-04-05 ENCOUNTER — Institutional Professional Consult (permissible substitution): Payer: BLUE CROSS/BLUE SHIELD | Admitting: Pulmonary Disease

## 2018-04-11 ENCOUNTER — Other Ambulatory Visit (HOSPITAL_COMMUNITY)
Admission: RE | Admit: 2018-04-11 | Discharge: 2018-04-11 | Disposition: A | Discharge status: Home / Self Care | Payer: BLUE CROSS/BLUE SHIELD | Source: Ambulatory Visit | Attending: Oncology | Admitting: Oncology

## 2018-04-11 DIAGNOSIS — R590 Localized enlarged lymph nodes: Secondary | ICD-10-CM | POA: Diagnosis present

## 2018-04-11 DIAGNOSIS — D360 Benign neoplasm of lymph nodes: Secondary | ICD-10-CM | POA: Diagnosis not present

## 2018-04-11 DIAGNOSIS — C541 Malignant neoplasm of endometrium: Secondary | ICD-10-CM | POA: Diagnosis present

## 2018-04-12 ENCOUNTER — Ambulatory Visit: Payer: BLUE CROSS/BLUE SHIELD | Admitting: Gynecologic Oncology

## 2018-04-25 ENCOUNTER — Encounter (HOSPITAL_COMMUNITY): Payer: Self-pay | Admitting: Oncology

## 2018-05-03 ENCOUNTER — Ambulatory Visit: Payer: Self-pay | Admitting: Obstetrics

## 2018-06-09 ENCOUNTER — Encounter: Payer: Self-pay | Admitting: Oncology

## 2018-06-13 DIAGNOSIS — E876 Hypokalemia: Secondary | ICD-10-CM

## 2018-06-13 DIAGNOSIS — Z923 Personal history of irradiation: Secondary | ICD-10-CM | POA: Diagnosis not present

## 2018-06-13 DIAGNOSIS — C541 Malignant neoplasm of endometrium: Secondary | ICD-10-CM | POA: Diagnosis not present

## 2018-06-13 DIAGNOSIS — C7800 Secondary malignant neoplasm of unspecified lung: Secondary | ICD-10-CM | POA: Diagnosis not present

## 2018-07-25 DIAGNOSIS — C541 Malignant neoplasm of endometrium: Secondary | ICD-10-CM

## 2018-07-25 DIAGNOSIS — Z923 Personal history of irradiation: Secondary | ICD-10-CM

## 2018-07-25 DIAGNOSIS — C7800 Secondary malignant neoplasm of unspecified lung: Secondary | ICD-10-CM

## 2018-08-13 ENCOUNTER — Emergency Department (HOSPITAL_COMMUNITY)
Admission: EM | Admit: 2018-08-13 | Discharge: 2018-08-13 | Disposition: A | Discharge status: Home / Self Care | Payer: BLUE CROSS/BLUE SHIELD | Attending: Emergency Medicine | Admitting: Emergency Medicine

## 2018-08-13 ENCOUNTER — Other Ambulatory Visit: Payer: Self-pay

## 2018-08-13 ENCOUNTER — Emergency Department (HOSPITAL_COMMUNITY): Payer: BLUE CROSS/BLUE SHIELD

## 2018-08-13 ENCOUNTER — Encounter (HOSPITAL_COMMUNITY): Payer: Self-pay | Admitting: *Deleted

## 2018-08-13 DIAGNOSIS — N938 Other specified abnormal uterine and vaginal bleeding: Secondary | ICD-10-CM | POA: Diagnosis present

## 2018-08-13 DIAGNOSIS — R319 Hematuria, unspecified: Secondary | ICD-10-CM | POA: Insufficient documentation

## 2018-08-13 DIAGNOSIS — N3040 Irradiation cystitis without hematuria: Secondary | ICD-10-CM | POA: Diagnosis not present

## 2018-08-13 DIAGNOSIS — Z79899 Other long term (current) drug therapy: Secondary | ICD-10-CM | POA: Diagnosis not present

## 2018-08-13 DIAGNOSIS — Z923 Personal history of irradiation: Secondary | ICD-10-CM | POA: Insufficient documentation

## 2018-08-13 DIAGNOSIS — Z8542 Personal history of malignant neoplasm of other parts of uterus: Secondary | ICD-10-CM | POA: Diagnosis not present

## 2018-08-13 LAB — URINALYSIS, ROUTINE W REFLEX MICROSCOPIC
BILIRUBIN URINE: NEGATIVE
Glucose, UA: 50 mg/dL — AB
Ketones, ur: NEGATIVE mg/dL
Leukocytes, UA: NEGATIVE
Nitrite: NEGATIVE
Protein, ur: 100 mg/dL — AB
RBC / HPF: >50 RBC/hpf — ABNORMAL HIGH (ref 0–5)
SPECIFIC GRAVITY, URINE: 1.003 — AB (ref 1.005–1.030)
pH: 8 (ref 5.0–8.0)

## 2018-08-13 LAB — CBC WITH DIFFERENTIAL/PLATELET
Abs Immature Granulocytes: 0 10*3/uL (ref 0.0–0.1)
Basophils Absolute: 0 10*3/uL (ref 0.0–0.1)
Basophils Relative: 0 %
EOS ABS: 0 10*3/uL (ref 0.0–0.7)
EOS PCT: 0 %
HEMATOCRIT: 24.7 % — AB (ref 36.0–46.0)
Hemoglobin: 7.7 g/dL — ABNORMAL LOW (ref 12.0–15.0)
Immature Granulocytes: 1 %
LYMPHS ABS: 0.6 10*3/uL — AB (ref 0.7–4.0)
Lymphocytes Relative: 20 %
MCH: 33.9 pg (ref 26.0–34.0)
MCHC: 31.2 g/dL (ref 30.0–36.0)
MCV: 108.8 fL — AB (ref 78.0–100.0)
MONOS PCT: 11 %
Monocytes Absolute: 0.3 10*3/uL (ref 0.1–1.0)
Neutro Abs: 2.1 10*3/uL (ref 1.7–7.7)
Neutrophils Relative %: 68 %
Platelets: 65 10*3/uL — ABNORMAL LOW (ref 150–400)
RBC: 2.27 MIL/uL — ABNORMAL LOW (ref 3.87–5.11)
RDW: 15.4 % (ref 11.5–15.5)
WBC: 3 10*3/uL — ABNORMAL LOW (ref 4.0–10.5)

## 2018-08-13 LAB — BASIC METABOLIC PANEL
Anion gap: 11 (ref 5–15)
BUN: 11 mg/dL (ref 8–23)
CO2: 26 mmol/L (ref 22–32)
Calcium: 7.6 mg/dL — ABNORMAL LOW (ref 8.9–10.3)
Chloride: 101 mmol/L (ref 98–111)
Creatinine, Ser: 0.71 mg/dL (ref 0.44–1.00)
GFR calc Af Amer: >60 mL/min (ref >60)
Glucose, Bld: 126 mg/dL — ABNORMAL HIGH (ref 70–99)
POTASSIUM: 2.8 mmol/L — AB (ref 3.5–5.1)
SODIUM: 138 mmol/L (ref 135–145)

## 2018-08-13 NOTE — ED Provider Notes (Signed)
[facility] EMERGENCY DEPARTMENT Provider Note   CSN: [id number] Arrival date & time: 08/13/18  0126     History   Chief Complaint Chief Complaint  Patient presents with  . Vaginal Bleeding    HPI Brianna Herring is a 62 y.o. female.  Patient is a 62 year old female with past medical history of endometrial cancer status post hysterectomy and radiation therapy.  She presents today with complaints of dysuria and hematuria.  This has been ongoing for the past several weeks or so and is worsening.  He reports some suprapubic discomfort.  She has been evaluated by her urologist for this and no definite causes been found.  They suspect a bladder inflammation related to prior radiation therapy.  Patient presents tonight because she was unable to urinate, then when she did passed small clots.  She denies any fevers or chills.     Past Medical History:  Diagnosis Date  . Cancer (HCC)    endometrial  . Headache   . History of radiation therapy 03/02/17-03/16/17   vaginal cuff treated to 18 Gy with 3 fractions of 6 Gy  . Localized swelling of both lower legs    wears compression hose  . Phlebitis alone left leg  4-5 yrs ago    Patient Active Problem List   Diagnosis Date Noted  . Multiple lung nodules on CT 03/14/2018  . 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 Endometrial adenocarcinoma ([place]) 11/03/2016    Past Surgical History:  Procedure Laterality Date  . NO PAST SURGERIES    . ROBOTIC ASSISTED TOTAL HYSTERECTOMY WITH BILATERAL SALPINGO OOPHERECTOMY N/A 11/18/2016   Procedure: XI ROBOTIC ASSISTED TOTAL HYSTERECTOMY WITH BILATERAL SALPINGO OOPHORECTOMY;  Surgeon: Everitt Amber, MD;  Location: [facility] ORS;  Service: Gynecology;  Laterality: N/A;  . SENTINEL NODE BIOPSY N/A 11/18/2016   Procedure: SENTINEL NODE BIOPSY;  Surgeon: Everitt Amber, MD;  Location: [facility] ORS;  Service: Gynecology;  Laterality: N/A;     OB  History   None      Home Medications    Prior to Admission medications   Medication Sig Start Date End Date Taking? Authorizing Provider  calcium carbonate (CALTRATE 600) 1500 (600 Ca) MG TABS tablet Take 1,500 tablets by mouth 2 (two) times daily.     [provider]  Multiple Vitamins-Minerals (CENTRUM SILVER PO) Take 1 tablet by mouth daily.     [provider]    Family History Family History  Problem Relation Age of Onset  . Lung cancer Father   . Breast cancer Sister     Social History Social History   Tobacco Use  . Smoking status: Never Smoker  . Smokeless tobacco: Never Used  Substance Use Topics  . Alcohol use: No  . Drug use: No     Allergies   Patient has no known allergies.   Review of Systems Review of Systems  All other systems reviewed and are negative.    Physical Exam Updated Vital Signs BP 138/70 (BP Location: Right Arm)   Pulse (!) 109   Temp 98.2 F (36.8 C) (Oral)   Resp 14   SpO2 99%   Physical Exam  Constitutional: She is oriented to person, place, and time. She appears well-developed and well-nourished. No distress.  HENT:  Head: Normocephalic and atraumatic.  Neck: Normal range of motion. Neck supple.  Cardiovascular: Normal rate and regular rhythm. Exam reveals no gallop and no friction rub.  No murmur heard.  Pulmonary/Chest: Effort normal and breath sounds normal. No respiratory distress. She has no wheezes.  Abdominal: Soft. Bowel sounds are normal. She exhibits no distension. There is no tenderness.  Musculoskeletal: Normal range of motion.  Neurological: She is alert and oriented to person, place, and time.  Skin: Skin is warm and dry. She is not diaphoretic.  Nursing note and vitals reviewed.    ED Treatments / Results  Labs (all labs ordered are listed, but only abnormal results are displayed) Labs Reviewed  BASIC METABOLIC PANEL  CBC WITH DIFFERENTIAL/PLATELET  URINALYSIS, ROUTINE W REFLEX  MICROSCOPIC    EKG None  Radiology No results found.  Procedures Procedures (including critical care time)  Medications Ordered in ED Medications - No data to display   Initial Impression / Assessment and Plan / ED Course  I have reviewed the triage vital signs and the nursing notes.  Pertinent labs & imaging results that were available during my care of the patient were reviewed by me and considered in my medical decision making (see chart for details).  Patient with bloody urine for the past 2 months.  She has a history of radiation therapy for endometrial cancer.  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  She recently had a cystoscopy revealing no acute findings.  She presents tonight with difficulty urinating, however now seems to be urinating without difficulty.  Her urinalysis reveals blood, however no infection.  CT scan shows thickening of the bladder, but no other abnormality.  I have discussed these findings with Dr. Tresa Moore from urology.  He does not feel as though any emergent intervention is indicated and suggest that the patient follow-up with her regular urologist this week as scheduled.   Final Clinical Impressions(s) / ED Diagnoses   Final diagnoses:  None    ED Discharge Orders    None       Veryl Speak, MD 08/13/18 [phone]

## 2018-08-13 NOTE — ED Triage Notes (Addendum)
Pt had a total hysterectomy in January. Has had ongoing bleeding for a while. Reports difficulty urinating tonight until she was able to pass a small blood clot from her vaginal. C/o lower abd  And vaginal pain only when trying to urinate.  Also reports recently finished chemo for lung cancer. Urologist did a cystoscopy, no significant findings

## 2018-08-13 NOTE — Discharge Instructions (Addendum)
Follow-up with your urologist on Thursday as scheduled, sooner if you experience additional problems or worsening symptoms.

## 2018-08-13 NOTE — ED Notes (Signed)
Pt went to the bathroom and could not wait for me to grab the hat to get a sample. Gave hat to pt for when she does have to go.

## 2018-09-26 ENCOUNTER — Telehealth: Payer: Self-pay | Admitting: *Deleted

## 2018-09-26 NOTE — Telephone Encounter (Signed)
Arbie Cookey from [facility] called and scheduled the patient for a follow up appt.

## 2018-10-02 ENCOUNTER — Encounter: Payer: Self-pay | Admitting: Gynecologic Oncology

## 2018-10-02 ENCOUNTER — Inpatient Hospital Stay: Payer: BLUE CROSS/BLUE SHIELD | Attending: Gynecologic Oncology | Admitting: Gynecologic Oncology

## 2018-10-02 VITALS — BP 143/60 | HR 110 | Temp 98.5°F | Resp 20 | Ht 66.0 in | Wt 235.5 lb

## 2018-10-02 DIAGNOSIS — C7800 Secondary malignant neoplasm of unspecified lung: Secondary | ICD-10-CM | POA: Diagnosis not present

## 2018-10-02 DIAGNOSIS — Z6841 Body Mass Index (BMI) 40.0 and over, adult: Secondary | ICD-10-CM

## 2018-10-02 DIAGNOSIS — Z923 Personal history of irradiation: Secondary | ICD-10-CM | POA: Diagnosis not present

## 2018-10-02 DIAGNOSIS — C801 Malignant (primary) neoplasm, unspecified: Secondary | ICD-10-CM

## 2018-10-02 DIAGNOSIS — Z9221 Personal history of antineoplastic chemotherapy: Secondary | ICD-10-CM | POA: Diagnosis not present

## 2018-10-02 DIAGNOSIS — Z90710 Acquired absence of both cervix and uterus: Secondary | ICD-10-CM | POA: Insufficient documentation

## 2018-10-02 DIAGNOSIS — Z90722 Acquired absence of ovaries, bilateral: Secondary | ICD-10-CM | POA: Diagnosis not present

## 2018-10-02 DIAGNOSIS — F17210 Nicotine dependence, cigarettes, uncomplicated: Secondary | ICD-10-CM

## 2018-10-02 DIAGNOSIS — R600 Localized edema: Secondary | ICD-10-CM

## 2018-10-02 DIAGNOSIS — C541 Malignant neoplasm of endometrium: Secondary | ICD-10-CM | POA: Diagnosis not present

## 2018-10-02 NOTE — Patient Instructions (Signed)
Please notify Dr Denman George at phone number [phone] if you notice vaginal bleeding, new pelvic or abdominal pains, bloating, feeling full easy, or a change in bladder or bowel function.   Dr Serita Grit office will recommend an ultrasound be done of your left leg.  Please return to see Dr Hinton Rao in January, 2020 and Dr Denman George in April, 2020.

## 2018-10-02 NOTE — Progress Notes (Addendum)
Follow-up Note: Gyn-Onc  Consult was requested by Dr. Alfonse Spruce for the evaluation of Brianna Herring 62 y.o. female  CC:  Chief Complaint  Patient presents with  . recurrent endometrial cancer    follow0up    Assessment/Plan:  [name]  is a 62 y.o.  year old with a history of recurrent high grade endometrial cancer. MSI high/MMR abnormal.  High/intermediate uterine risk factors. S/p whole pelvic RT and vaginal brachytherapy (completed May, 2018) S/p salvage chemotherapy (platinum and taxane) completed in September, 2019.  On Tamoxifen/maintenance.   Follow-up with Dr Hinton Rao in January (would recommend CT at that time), and me in April.   HPI: Brianna Herring is a 62 year old G0 who is seen in consultation at the request of Dr Alfonse Spruce for high grade endometrial cancer.  She is morbidly obese with a BMI of 44kg/m2.  She developed postmenopausal bleeding in September, 2017 and was evaluated by Dr Alfonse Spruce in November, 2017 when a TVUS was performed (09/13/16) which showed a uterus measuring 6.6x4.7x3.1cm with a thickened ES of 40m.  Due to her narrow vagina and obesity, office sampling was unsuccessful.  She was taken to the OR on 10/06/16 by Dr NAlfonse Sprucefor a D&C with the pathology revealing high grade endometrial cancer (grade 3).  Preoperative CT scan of the chest, abdo, pelvis on 11/15/16 showed multiple pulmonary nodules suspicious for metastatic disease. These are most numerous within the right lower lobe and could be postinflammatory..  On 11/18/16 she was taken to the OR for a robotic assisted total hysterectomy, BSO, SLN biopsy.  Final pathology showed a grade 3 endometrioid endometrial adenocarcinoma with a 4cm tumor, 1.8 of 2.3cm (80%) myometrial invasion with LVSI present. This constituted high risk features for recurrence and adjuvant radiation was recommended to reduce local recurrence risk in accordance with NCCN guidelines. 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  Due to the findings of preoperative nodules in the lung, she had a postop PET/CT on December 10, 2016.  This showed borderline prominent portacaval and left inguinal lymph nodes not appreciably hypermetabolic.  A 5 mm right lower lobe pulmonary nodule below sensitive PET/CT thresholds surveillance recommended.  Right external iliac and pelvic sidewall regions with some soft tissue densities in that region follow-up recommended.  She went on to receive vaginal brachitherapy with Dr. KSondra Comebetween March 02, 2017 and Mar 16, 2017 for high intermediate risk factor endometrial cancer.  She tolerated therapy well.    She had minimal follow-up initially as she was caring for her sick mother.  She was recommended and referred to see genetics for her MSI tumor however she did not follow through with this appointment when made twice.  Interval Hx: In April, 2019 follow-up CT imaging to re-evaluate the pulmonary nodules revealed multiple pulmonary nodules consistent with recurrent disease.  She was referred to Dr MHinton Raoin AHarperwho administered salvage chemotherapy with carboplatin and paclitaxel. She completed 6 cycles completed in September, 2019. She was started on Tamoxifen for maintenance in October, 2019.  [facility] with no new or progressive findings in the chest no CT findings of recurrent tumor in the pelvis and no evidence of metastatic disease involving the solid abdominal organs or omentum or peritoneal surfaces.  She reports left lower extremity edema since beginning tamoxifen.   Current Meds:  Outpatient Encounter Medications as of 10/02/2018  Medication Sig  .  calcium carbonate (CALTRATE 600) 1500 (600 Ca) MG TABS tablet Take 1,500 tablets by mouth 2 (two) times daily.   . Multiple Vitamins-Minerals (CENTRUM SILVER PO) Take 1 tablet by mouth  daily.   . Potassium Chloride ER 20 MEQ TBCR Take 1 tablet by mouth daily.  . Probiotic Product (PROBIOTIC PO) Take 1 capsule by mouth daily.  . tamoxifen (NOLVADEX) 20 MG tablet Take 20 mg by mouth daily.   No facility-administered encounter medications on file as of 10/02/2018.     Allergy: No Known Allergies  Social Hx:   Social History   Socioeconomic History  . Marital status: Single    Spouse name: Not on file  . Number of children: 0  . Years of education: Not on file  . Highest education level: Not on file  Occupational History  . Occupation: sewer  Social Needs  . Financial resource strain: Not on file  . Food insecurity:    Worry: Not on file    Inability: Not on file  . Transportation needs:    Medical: Not on file    Non-medical: Not on file  Tobacco Use  . Smoking status: Never Smoker  . Smokeless tobacco: Never Used  Substance and Sexual Activity  . Alcohol use: No  . Drug use: No  . Sexual activity: Never  Lifestyle  . Physical activity:    Days per week: Not on file    Minutes per session: Not on file  . Stress: Not on file  Relationships  . Social connections:    Talks on phone: Not on file    Gets together: Not on file    Attends religious service: Not on file    Active member of club or organization: Not on file    Attends meetings of clubs or organizations: Not on file    Relationship status: Not on file  . Intimate partner violence:    Fear of current or ex partner: Not on file    Emotionally abused: Not on file    Physically abused: Not on file    Forced sexual activity: Not on file  Other Topics Concern  . Not on file  Social History Narrative  . Not on file    Past Surgical Hx:  Past Surgical History:  Procedure Laterality Date  . NO PAST SURGERIES    . ROBOTIC ASSISTED TOTAL HYSTERECTOMY WITH BILATERAL SALPINGO OOPHERECTOMY N/A 11/18/2016   Procedure: XI ROBOTIC ASSISTED TOTAL HYSTERECTOMY WITH BILATERAL SALPINGO OOPHORECTOMY;   Surgeon: Everitt Amber, MD;  Location: [facility];  Service: Gynecology;  Laterality: N/A;  . SENTINEL NODE BIOPSY N/A 11/18/2016   Procedure: SENTINEL NODE BIOPSY;  Surgeon: Everitt Amber, MD;  Location: [facility];  Service: Gynecology;  Laterality: N/A;    Past Medical Hx:  Past Medical History:  Diagnosis Date  . Cancer (HCC)    endometrial  . Headache   . History of radiation therapy 03/02/17-03/16/17   vaginal cuff treated to 18 Gy with 3 fractions of 6 Gy  . Localized swelling of both lower legs    wears compression hose  . Phlebitis alone left leg  4-5 yrs ago    Past Gynecological History:  G0 No LMP recorded. Patient has had a hysterectomy.  Family Hx:  Family History  Problem Relation Age of Onset  . Lung cancer Father   . Breast cancer Sister     Review of Systems:  Constitutional  Feels well,    ENT Normal appearing ears and  nares bilaterally Skin/Breast  No rash, sores, jaundice, itching, dryness Cardiovascular  No chest pain, shortness of breath, or edema  Pulmonary  No cough or wheeze.  Gastro Intestinal  No nausea, vomitting, or diarrhoea. No bright red blood per rectum, no abdominal pain, change in bowel movement, or constipation.  Genito Urinary  No frequency, urgency, dysuria, no postmenopausal bleeding Musculo Skeletal  No myalgia, arthralgia, joint swelling or pain  Neurologic  No weakness, numbness, change in gait,  Psychology  No depression, anxiety, insomnia.   Vitals:  Blood pressure (!) 143/60, pulse (!) 110, temperature 98.5 F (36.9 C), temperature source Oral, resp. rate 20, height '5\' 6"'  (1.676 m), weight 235 lb 8 oz (106.8 kg), SpO2 97 %.  Physical Exam: WD in NAD Neck  Supple NROM, without any enlargements.  Lymph Node Survey No cervical supraclavicular or inguinal adenopathy Cardiovascular  Pulse normal rate, regularity and rhythm. S1 and S2 normal.  Lungs  Clear to auscultation bilateraly, without wheezes/crackles/rhonchi. Good air  movement.  Skin  No rash/lesions/breakdown  Psychiatry  Alert and oriented to person, place, and time  Abdomen  Normoactive bowel sounds, abdomen soft, non-tender and obese without evidence of hernia.  Back No CVA tenderness Genito Urinary  Vulva/vagina: Normal external female genitalia.   No lesions. No discharge or bleeding.  Bladder/urethra:  No lesions or masses, well supported bladder  Vagina: shortened vagina due to agglutination and radiation changes. No blood. No lesions or palpable masses.   Cervix & uterus: surgically absent.   Rectal  deferred Extremities  + mild left lower extremity edema    Thereasa Solo, MD  10/02/2018, 5:10 PM

## 2018-10-03 ENCOUNTER — Telehealth: Payer: Self-pay | Admitting: *Deleted

## 2018-10-03 NOTE — Telephone Encounter (Signed)
Called and left the patient a message with the information for the doppler tomorrow. Patient to arrive at [facility] at 11am for an 11:30am appt

## 2018-10-04 ENCOUNTER — Telehealth: Payer: Self-pay | Admitting: *Deleted

## 2018-10-04 NOTE — Telephone Encounter (Signed)
Called to inform patient of her results from her doppler ultrasound done on 10/03/18.  Per Joylene John, NP the results are negative for DVT.  Patient verbalized understanding.

## 2018-10-13 ENCOUNTER — Telehealth: Payer: Self-pay | Admitting: *Deleted

## 2018-10-13 NOTE — Telephone Encounter (Signed)
Per request from Dr. Remi Deter office , fax the last office note to his office at [phone]

## 2018-11-21 ENCOUNTER — Encounter: Payer: Self-pay | Admitting: Oncology

## 2018-11-23 DIAGNOSIS — Z90710 Acquired absence of both cervix and uterus: Secondary | ICD-10-CM

## 2018-11-23 DIAGNOSIS — Z90722 Acquired absence of ovaries, bilateral: Secondary | ICD-10-CM

## 2018-11-23 DIAGNOSIS — C541 Malignant neoplasm of endometrium: Secondary | ICD-10-CM

## 2018-11-23 DIAGNOSIS — Z9221 Personal history of antineoplastic chemotherapy: Secondary | ICD-10-CM

## 2018-11-23 DIAGNOSIS — Z923 Personal history of irradiation: Secondary | ICD-10-CM | POA: Diagnosis not present

## 2018-11-23 DIAGNOSIS — D649 Anemia, unspecified: Secondary | ICD-10-CM

## 2018-11-23 DIAGNOSIS — C7800 Secondary malignant neoplasm of unspecified lung: Secondary | ICD-10-CM

## 2019-02-08 ENCOUNTER — Telehealth: Payer: Self-pay | Admitting: *Deleted

## 2019-02-08 NOTE — Telephone Encounter (Signed)
Attempted to contact the patient regarding her appt for 4/22. Left a message to call the office back. Need to move her appt from 4/22 out several weeks due to COVID-19.

## 2019-02-09 ENCOUNTER — Telehealth: Payer: Self-pay | Admitting: *Deleted

## 2019-02-09 NOTE — Telephone Encounter (Signed)
Attempted to call the patient to move her appt from 4/22 due to COVID-19. Appt needs to be moved due to hospital policy to move all routine appts out several weeks.

## 2019-02-14 ENCOUNTER — Telehealth: Payer: Self-pay | Admitting: *Deleted

## 2019-02-14 NOTE — Telephone Encounter (Signed)
Called and left the patient a message to call the office back. Need to see if the patient can do either a phone visit or WebEx.

## 2019-02-15 ENCOUNTER — Telehealth: Payer: Self-pay | Admitting: *Deleted

## 2019-02-15 NOTE — Telephone Encounter (Signed)
Reached the patient, she is ok with a phone visit on 4/22. Appt needs to be after 4pm if possible.

## 2019-02-22 DIAGNOSIS — C7800 Secondary malignant neoplasm of unspecified lung: Secondary | ICD-10-CM

## 2019-02-22 DIAGNOSIS — C541 Malignant neoplasm of endometrium: Secondary | ICD-10-CM

## 2019-02-28 ENCOUNTER — Inpatient Hospital Stay: Payer: PRIVATE HEALTH INSURANCE | Attending: Gynecologic Oncology | Admitting: Gynecologic Oncology

## 2019-02-28 DIAGNOSIS — Z79810 Long term (current) use of selective estrogen receptor modulators (SERMs): Secondary | ICD-10-CM

## 2019-02-28 DIAGNOSIS — C541 Malignant neoplasm of endometrium: Secondary | ICD-10-CM | POA: Diagnosis not present

## 2019-02-28 DIAGNOSIS — Z923 Personal history of irradiation: Secondary | ICD-10-CM

## 2019-02-28 DIAGNOSIS — Z90710 Acquired absence of both cervix and uterus: Secondary | ICD-10-CM

## 2019-02-28 DIAGNOSIS — N760 Acute vaginitis: Secondary | ICD-10-CM

## 2019-02-28 DIAGNOSIS — Z9079 Acquired absence of other genital organ(s): Secondary | ICD-10-CM

## 2019-02-28 DIAGNOSIS — B9689 Other specified bacterial agents as the cause of diseases classified elsewhere: Secondary | ICD-10-CM

## 2019-02-28 DIAGNOSIS — Z90722 Acquired absence of ovaries, bilateral: Secondary | ICD-10-CM

## 2019-02-28 DIAGNOSIS — Z803 Family history of malignant neoplasm of breast: Secondary | ICD-10-CM

## 2019-02-28 DIAGNOSIS — Z801 Family history of malignant neoplasm of trachea, bronchus and lung: Secondary | ICD-10-CM

## 2019-02-28 MED ORDER — METRONIDAZOLE 500 MG PO TABS: 500.0000 mg | ORAL_TABLET | Freq: Three times a day (TID) | ORAL | 1 refills | Status: DC

## 2019-02-28 NOTE — Progress Notes (Signed)
Follow-up Note: Gyn-Onc Gynecologic Oncology Telehealth Consult Note: Gyn-Onc  I connected with Brianna Herring on 02/28/19 at  1:00 PM EDT by telephone and verified that I am speaking with the correct person using two identifiers.  I discussed the limitations, risks, security and privacy concerns of performing an evaluation and management service by telemedicine and the availability of in-person appointments. I also discussed with the patient that there may be a patient responsible charge related to this service. The patient expressed understanding and agreed to proceed.  Other persons participating in the visit and their role in the encounter: .  Patient's location: home Provider's location: office  Chief Complaint:  Chief Complaint  Patient presents with  . endometrial cancer  . Vaginal Discharge    Assessment/Plan:  Ms. Lei Dower  is a 63 y.o.  year old with a history of recurrent high grade endometrial cancer. MSI high/MMR abnormal.  High/intermediate uterine risk factors. S/p whole pelvic RT and vaginal brachytherapy (completed May, 2018) S/p salvage chemotherapy (platinum and taxane) completed in September, 2019.  On Tamoxifen/maintenance.   Follow-up with Dr Hinton Rao in the summer (would recommend CT at that time), and me in October (she will call in the summer for this appointment).  Vaginal discharge - prescribed Flagyl TID for 1 week for presumed BV.  HPI: Brianna Herring is a 63 year old G0 who is seen in consultation at the request of Dr Alfonse Spruce for high grade endometrial cancer.  She is morbidly obese with a BMI of 44kg/m2.  She developed postmenopausal bleeding in September, 2017 and was evaluated by Dr Alfonse Spruce in November, 2017 when a TVUS was performed (09/13/16) which showed a uterus measuring 6.6x4.7x3.1cm with a thickened ES of 49m.  Due to her narrow vagina and obesity, office sampling was unsuccessful.  She was taken to the OR on 10/06/16 by Dr  NAlfonse Sprucefor a D&C with the pathology revealing high grade endometrial cancer (grade 3).  Preoperative CT scan of the chest, abdo, pelvis on 11/15/16 showed multiple pulmonary nodules suspicious for metastatic disease. These are most numerous within the right lower lobe and could be postinflammatory..  On 11/18/16 she was taken to the OR for a robotic assisted total hysterectomy, BSO, SLN biopsy.  Final pathology showed a grade 3 endometrioid endometrial adenocarcinoma with a 4cm tumor, 1.8 of 2.3cm (80%) myometrial invasion with LVSI present. This constituted high risk features for recurrence and adjuvant radiation was recommended to reduce local recurrence risk in accordance with NCCN guidelines. 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  Due to the findings of preoperative nodules in the lung, she had a postop PET/CT on December 10, 2016.  This showed borderline prominent portacaval and left inguinal lymph nodes not appreciably hypermetabolic.  A 5 mm right lower lobe pulmonary nodule below sensitive PET/CT thresholds surveillance recommended.  Right external iliac and pelvic sidewall regions with some soft tissue densities in that region follow-up recommended.  She went on to receive vaginal brachitherapy with Dr. KSondra Comebetween March 02, 2017 and Mar 16, 2017 for high intermediate risk factor endometrial cancer.  She tolerated therapy well.    She had minimal follow-up initially as she was caring for her sick mother.  She was recommended and referred to see genetics for her MSI tumor however she did not follow through with this appointment when made twice.  In April, 2019 follow-up CT imaging to re-evaluate the pulmonary nodules revealed multiple pulmonary nodules consistent with  recurrent disease.  She was referred to Dr Hinton Rao in [place] who administered salvage chemotherapy with carboplatin and paclitaxel. She completed 6 cycles completed in  September, 2019. She was started on Tamoxifen for maintenance in October, 2019.  CT abd/pelvis on June 09, 2018 at [facility] showed improved pulmonary metastatic disease with no new or progressive findings in the chest no CT findings of recurrent tumor in the pelvis and no evidence of metastatic disease involving the solid abdominal organs or omentum or peritoneal surfaces.  She reports left lower extremity edema since beginning tamoxifen.   Interval Hx: She had a CT abd/pelvis in January ,2020 which showed no gross progression. There was a slightly more prominent fluid collection in the left lower pelvis which was not obviously malignant.   She reports vaginal discharge that is fishy in odor.   Current Meds:  Outpatient Encounter Medications as of 02/28/2019  Medication Sig  . calcium carbonate (CALTRATE 600) 1500 (600 Ca) MG TABS tablet Take 1,500 tablets by mouth 2 (two) times daily.   . metroNIDAZOLE (FLAGYL) 500 MG tablet Take 1 tablet (500 mg total) by mouth 3 (three) times daily.  . Multiple Vitamins-Minerals (CENTRUM SILVER PO) Take 1 tablet by mouth daily.   . Potassium Chloride ER 20 MEQ TBCR Take 1 tablet by mouth daily.  . Probiotic Product (PROBIOTIC PO) Take 1 capsule by mouth daily.  . tamoxifen (NOLVADEX) 20 MG tablet Take 20 mg by mouth daily.   No facility-administered encounter medications on file as of 02/28/2019.     Allergy: No Known Allergies  Social Hx:   Social History   Socioeconomic History  . Marital status: Single    Spouse name: Not on file  . Number of children: 0  . Years of education: Not on file  . Highest education level: Not on file  Occupational History  . Occupation: sewer  Social Needs  . Financial resource strain: Not on file  . Food insecurity:    Worry: Not on file    Inability: Not on file  . Transportation needs:    Medical: Not on file    Non-medical: Not on file  Tobacco Use  . Smoking status: Never Smoker  .  Smokeless tobacco: Never Used  Substance and Sexual Activity  . Alcohol use: No  . Drug use: No  . Sexual activity: Never  Lifestyle  . Physical activity:    Days per week: Not on file    Minutes per session: Not on file  . Stress: Not on file  Relationships  . Social connections:    Talks on phone: Not on file    Gets together: Not on file    Attends religious service: Not on file    Active member of club or organization: Not on file    Attends meetings of clubs or organizations: Not on file    Relationship status: Not on file  . Intimate partner violence:    Fear of current or ex partner: Not on file    Emotionally abused: Not on file    Physically abused: Not on file    Forced sexual activity: Not on file  Other Topics Concern  . Not on file  Social History Narrative  . Not on file    Past Surgical Hx:  Past Surgical History:  Procedure Laterality Date  . NO PAST SURGERIES    . ROBOTIC ASSISTED TOTAL HYSTERECTOMY WITH BILATERAL SALPINGO OOPHERECTOMY N/A 11/18/2016   Procedure: XI ROBOTIC ASSISTED TOTAL  HYSTERECTOMY WITH BILATERAL SALPINGO OOPHORECTOMY;  Surgeon: Everitt Amber, MD;  Location: [facility];  Service: Gynecology;  Laterality: N/A;  . SENTINEL NODE BIOPSY N/A 11/18/2016   Procedure: SENTINEL NODE BIOPSY;  Surgeon: Everitt Amber, MD;  Location: [facility];  Service: Gynecology;  Laterality: N/A;    Past Medical Hx:  Past Medical History:  Diagnosis Date  . Cancer (HCC)    endometrial  . Headache   . History of radiation therapy 03/02/17-03/16/17   vaginal cuff treated to 18 Gy with 3 fractions of 6 Gy  . Localized swelling of both lower legs    wears compression hose  . Phlebitis alone left leg  4-5 yrs ago    Past Gynecological History:  G0 No LMP recorded. Patient has had a hysterectomy.  Family Hx:  Family History  Problem Relation Age of Onset  . Lung cancer Father   . Breast cancer Sister     Review of Systems:  Constitutional  Feels well,     ENT Normal appearing ears and nares bilaterally Skin/Breast  No rash, sores, jaundice, itching, dryness Cardiovascular  No chest pain, shortness of breath, or edema  Pulmonary  No cough or wheeze.  Gastro Intestinal  No nausea, vomitting, or diarrhoea. No bright red blood per rectum, no abdominal pain, change in bowel movement, or constipation.  Genito Urinary  No frequency, urgency, dysuria, no postmenopausal bleeding, + vaginal dyscharge Musculo Skeletal  No myalgia, arthralgia, joint swelling or pain  Neurologic  No weakness, numbness, change in gait,  Psychology  No depression, anxiety, insomnia.   Vitals:  There were no vitals taken for this visit.  Physical Exam: Deferred due to phone visit.  I discussed the assessment and treatment plan with the patient. The patient was provided with an opportunity to ask questions and all were answered. The patient agreed with the plan and demonstrated an understanding of the instructions.   The patient was advised to call back or see an in-person evaluation if the symptoms worsen or if the condition fails to improve as anticipated.   I provided 10 minutes of non face-to-face telephone visit time during this encounter, and > 50% was spent counseling as documented under my assessment & plan.  Thereasa Solo, MD  02/28/2019, 4:19 PM

## 2019-06-22 DIAGNOSIS — C541 Malignant neoplasm of endometrium: Secondary | ICD-10-CM

## 2019-06-22 DIAGNOSIS — C7800 Secondary malignant neoplasm of unspecified lung: Secondary | ICD-10-CM

## 2019-07-18 DIAGNOSIS — C7800 Secondary malignant neoplasm of unspecified lung: Secondary | ICD-10-CM | POA: Insufficient documentation

## 2019-07-18 HISTORY — DX: Secondary malignant neoplasm of unspecified lung: C78.00

## 2019-08-02 DIAGNOSIS — C7800 Secondary malignant neoplasm of unspecified lung: Secondary | ICD-10-CM | POA: Diagnosis not present

## 2019-08-02 DIAGNOSIS — C541 Malignant neoplasm of endometrium: Secondary | ICD-10-CM | POA: Diagnosis not present

## 2019-09-11 DIAGNOSIS — I361 Nonrheumatic tricuspid (valve) insufficiency: Secondary | ICD-10-CM | POA: Diagnosis not present

## 2019-10-11 DIAGNOSIS — C541 Malignant neoplasm of endometrium: Secondary | ICD-10-CM

## 2019-10-11 DIAGNOSIS — C7800 Secondary malignant neoplasm of unspecified lung: Secondary | ICD-10-CM

## 2020-01-22 DIAGNOSIS — N3041 Irradiation cystitis with hematuria: Secondary | ICD-10-CM

## 2020-01-22 DIAGNOSIS — A419 Sepsis, unspecified organism: Secondary | ICD-10-CM | POA: Diagnosis not present

## 2020-01-22 DIAGNOSIS — N390 Urinary tract infection, site not specified: Secondary | ICD-10-CM | POA: Diagnosis not present

## 2020-01-22 DIAGNOSIS — N939 Abnormal uterine and vaginal bleeding, unspecified: Secondary | ICD-10-CM | POA: Diagnosis not present

## 2020-01-23 DIAGNOSIS — N3041 Irradiation cystitis with hematuria: Secondary | ICD-10-CM | POA: Diagnosis not present

## 2020-01-23 DIAGNOSIS — N390 Urinary tract infection, site not specified: Secondary | ICD-10-CM | POA: Diagnosis not present

## 2020-01-23 DIAGNOSIS — A419 Sepsis, unspecified organism: Secondary | ICD-10-CM | POA: Diagnosis not present

## 2020-01-23 DIAGNOSIS — N939 Abnormal uterine and vaginal bleeding, unspecified: Secondary | ICD-10-CM | POA: Diagnosis not present

## 2020-01-24 DIAGNOSIS — N390 Urinary tract infection, site not specified: Secondary | ICD-10-CM | POA: Diagnosis not present

## 2020-01-24 DIAGNOSIS — N3041 Irradiation cystitis with hematuria: Secondary | ICD-10-CM | POA: Diagnosis not present

## 2020-01-24 DIAGNOSIS — A419 Sepsis, unspecified organism: Secondary | ICD-10-CM | POA: Diagnosis not present

## 2020-01-24 DIAGNOSIS — N939 Abnormal uterine and vaginal bleeding, unspecified: Secondary | ICD-10-CM | POA: Diagnosis not present

## 2020-02-22 DIAGNOSIS — C541 Malignant neoplasm of endometrium: Secondary | ICD-10-CM | POA: Diagnosis not present

## 2020-02-22 DIAGNOSIS — C7800 Secondary malignant neoplasm of unspecified lung: Secondary | ICD-10-CM

## 2020-02-26 ENCOUNTER — Telehealth: Payer: Self-pay | Admitting: *Deleted

## 2020-02-26 NOTE — Telephone Encounter (Signed)
Left message for patient to set up an appointment

## 2020-02-27 ENCOUNTER — Telehealth: Payer: Self-pay | Admitting: *Deleted

## 2020-02-27 ENCOUNTER — Ambulatory Visit
Admission: RE | Admit: 2020-02-27 | Discharge: 2020-02-27 | Disposition: A | Discharge status: Home / Self Care | Payer: Self-pay | Source: Ambulatory Visit | Attending: Gynecologic Oncology | Admitting: Gynecologic Oncology

## 2020-02-27 ENCOUNTER — Other Ambulatory Visit: Payer: Self-pay

## 2020-02-27 DIAGNOSIS — C541 Malignant neoplasm of endometrium: Secondary | ICD-10-CM

## 2020-02-27 NOTE — Telephone Encounter (Signed)
Called the patient and left a message to call the office back regarding an appt. Called Melissa Persons APP [phone])  at [facility] and left her a message with the appt information

## 2020-02-29 ENCOUNTER — Other Ambulatory Visit: Payer: Self-pay

## 2020-02-29 ENCOUNTER — Inpatient Hospital Stay: Payer: PRIVATE HEALTH INSURANCE | Attending: Gynecologic Oncology | Admitting: Gynecologic Oncology

## 2020-02-29 ENCOUNTER — Encounter: Payer: Self-pay | Admitting: Gynecologic Oncology

## 2020-02-29 VITALS — BP 132/64 | HR 122 | Temp 98.7°F | Resp 16 | Ht 66.0 in | Wt 230.8 lb

## 2020-02-29 DIAGNOSIS — Z90722 Acquired absence of ovaries, bilateral: Secondary | ICD-10-CM | POA: Diagnosis not present

## 2020-02-29 DIAGNOSIS — Z9221 Personal history of antineoplastic chemotherapy: Secondary | ICD-10-CM | POA: Diagnosis not present

## 2020-02-29 DIAGNOSIS — Z6841 Body Mass Index (BMI) 40.0 and over, adult: Secondary | ICD-10-CM | POA: Insufficient documentation

## 2020-02-29 DIAGNOSIS — C541 Malignant neoplasm of endometrium: Secondary | ICD-10-CM | POA: Diagnosis not present

## 2020-02-29 DIAGNOSIS — Z90710 Acquired absence of both cervix and uterus: Secondary | ICD-10-CM | POA: Diagnosis not present

## 2020-02-29 DIAGNOSIS — Z923 Personal history of irradiation: Secondary | ICD-10-CM | POA: Insufficient documentation

## 2020-02-29 NOTE — Progress Notes (Signed)
Follow-up Note: Gyn-Onc  Chief Complaint:  Chief Complaint  Patient presents with  . Endometrial adenocarcinoma ([place])    Follow up    Assessment/Plan:  [name]  is a 64 y.o.  year old with a history of recurrent high grade endometrial cancer. MSI high/MMR abnormal.  S/p whole pelvic RT and vaginal brachytherapy (completed May, 2018) On 4th line therapy with carb/taxol   Follow-up with Dr Hinton Rao as scheduled for ongoing chemotherapy.  Vaginal bleeding - has stopped. No source of this seen on exam today. Possible rectal/hemorrhoid source. Normal rectal exam. Counseled about stool softeners.   I would be happy to see her back periodically as part of surveillance if she develops a complete clinical response.   HPI: Brianna Herring is a 64 year old G0 who is seen in consultation at the request of Dr Alfonse Spruce for high grade endometrial cancer.  She is morbidly obese with a BMI of 44kg/m2.  She developed postmenopausal bleeding in September, 2017 and was evaluated by Dr Alfonse Spruce in November, 2017 when a TVUS was performed (09/13/16) which showed a uterus measuring 6.6x4.7x3.1cm with a thickened ES of 87m.  Due to her narrow vagina and obesity, office sampling was unsuccessful.  She was taken to the OR on 10/06/16 by Dr NAlfonse Sprucefor a D&C with the pathology revealing high grade endometrial cancer (grade 3).  Preoperative CT scan of the chest, abdo, pelvis on 11/15/16 showed multiple pulmonary nodules suspicious for metastatic disease. These are most numerous within the right lower lobe and could be postinflammatory..  On 11/18/16 she was taken to the OR for a robotic assisted total hysterectomy, BSO, SLN biopsy.  Final pathology showed a grade 3 endometrioid endometrial adenocarcinoma with a 4cm tumor, 1.8 of 2.3cm (80%) myometrial invasion with LVSI present. This constituted high risk features for recurrence and adjuvant radiation was recommended to reduce local recurrence  risk in accordance with NCCN guidelines. 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  Due to the findings of preoperative nodules in the lung, she had a postop PET/CT on December 10, 2016.  This showed borderline prominent portacaval and left inguinal lymph nodes not appreciably hypermetabolic.  A 5 mm right lower lobe pulmonary nodule below sensitive PET/CT thresholds surveillance recommended.  Right external iliac and pelvic sidewall regions with some soft tissue densities in that region follow-up recommended.  She went on to receive vaginal brachitherapy with Dr. KSondra Comebetween March 02, 2017 and Mar 16, 2017 for high intermediate risk factor endometrial cancer.  She tolerated therapy well.    She had minimal follow-up initially as she was caring for her sick mother.  She was recommended and referred to see genetics for her MSI tumor however she did not follow through with this appointment when made twice.  In April, 2019 follow-up CT imaging to re-evaluate the pulmonary nodules revealed multiple pulmonary nodules consistent with recurrent disease.  [place]atin and paclitaxel. She completed 6 cycles completed in September, 2019. She was started on Tamoxifen for maintenance in October, 2019.  [facility] with no new or progressive findings in the chest no CT findings of recurrent tumor in the pelvis and no evidence of metastatic disease involving the solid abdominal organs or omentum or peritoneal surfaces.  She reports left lower extremity edema since beginning tamoxifen.  She had a CT abd/pelvis in January ,2020 which showed no gross progression. There was a slightly more prominent fluid collection in the left lower pelvis which was not obviously malignant.   Interval  Hx:  CT scan in April, 2020 showed a slight increase in the right middle lobe pulmonary nodule.  Repeat CT scan in August, 2020 showed considerable enlargement of the right middle lobe pulmonary nodule from 1 cm to 2.3 cm consistent with progressive pulmonary metastases CT at the time of biopsy revealed the right middle lobe pulmonary nodule to measure approximately 3 cm.  Biopsy confirmed metastatic endometrial adenocarcinoma.  Foundation 1 CDX testing revealed was MSI high with increased TMD making her eligible for immunotherapy.  At that time her tamoxifen was discontinued.  She was started on pembrolizumab salvage therapy on August 09, 2019.  In November 2020 she was seen in the ER for shortness of breath and a CT angiogram revealed pulmonary emboli.  She was started on Rivaroxiban.  She received a total of 6 cycles of pembrolizumab and underwent CT imaging on [place] third 2021 which revealed progressive disease within the chest including a dominant right middle lobe pulmonary nodule now measuring 5.1 x 5.1 cm.  There was no evidence of local recurrence in the pelvis or evidence of metastatic disease.  She was changed to carboplatin, paclitaxel, bevacizumab salvage chemotherapy with her first cycle on 12/21/2019.  She presented to the emergency department with vaginal bleeding on March 16.  She was treated empirically for a urinary tract infection.  Anticoagulant therapy was held.  Her hemoglobin dropped to 8.8 at the time of admission.  Her anticoagulant therapy was resumed upon discharge.  She received her fourth cycle of carboplatin paclitaxel (bevacizumab is held) on February 22, 2020.  At that time she reported improvement in her vaginal bleeding symptoms her hemoglobin was 10.1.  She presents today for evaluation of her vaginal bleeding symptoms.   Current Meds:  Outpatient Encounter Medications as of 02/29/2020  Medication Sig  . calcium carbonate (CALTRATE 600) 1500 (600 Ca) MG TABS  tablet Take 1,500 tablets by mouth 2 (two) times daily.   Marland Kitchen loratadine (CLARITIN) 10 MG tablet Take 10 mg by mouth daily.  . Multiple Vitamins-Minerals (CENTRUM SILVER PO) Take 1 tablet by mouth daily.   . potassium chloride (KLOR-CON) 10 MEQ tablet Take 10 mEq by mouth 2 (two) times daily.  . Potassium Chloride ER 20 MEQ TBCR Take 1 tablet by mouth daily.  . Probiotic Product (PROBIOTIC PO) Take 1 capsule by mouth daily.  Alveda Reasons 20 MG TABS tablet Take 20 mg by mouth daily.  . metroNIDAZOLE (FLAGYL) 500 MG tablet Take 1 tablet (500 mg total) by mouth 3 (three) times daily. (Patient not taking: Reported on 02/29/2020)  . tamoxifen (NOLVADEX) 20 MG tablet Take 20 mg by mouth daily.   No facility-administered encounter medications on file as of 02/29/2020.    Allergy: No Known Allergies  Social Hx:   Social History   Socioeconomic History  . Marital status: Single    Spouse name: Not on file  . Number of children: 0  . Years of education: Not on file  . Highest education level: Not on file  Occupational History  . Occupation: sewer  Tobacco Use  . Smoking status: Never Smoker  . Smokeless tobacco: Never Used  Substance and Sexual Activity  . Alcohol use: No  . Drug use: No  . Sexual activity: Never  Other Topics Concern  .  Not on file  Social History Narrative  . Not on file   Social Determinants of Health   Financial Resource Strain:   . Difficulty of Paying Living Expenses:   Food Insecurity:   . Worried About Charity fundraiser in the Last Year:   . Arboriculturist in the Last Year:   Transportation Needs:   . Film/video editor (Medical):   Marland Kitchen Lack of Transportation (Non-Medical):   Physical Activity:   . Days of Exercise per Week:   . Minutes of Exercise per Session:   Stress:   . Feeling of Stress :   Social Connections:   . Frequency of Communication with Friends and Family:   . Frequency of Social Gatherings with Friends and Family:   . Attends  Religious Services:   . Active Member of Clubs or Organizations:   . Attends Archivist Meetings:   Marland Kitchen Marital Status:   Intimate Partner Violence:   . Fear of Current or Ex-Partner:   . Emotionally Abused:   Marland Kitchen Physically Abused:   . Sexually Abused:     Past Surgical Hx:  Past Surgical History:  Procedure Laterality Date  . NO PAST SURGERIES    . ROBOTIC ASSISTED TOTAL HYSTERECTOMY WITH BILATERAL SALPINGO OOPHERECTOMY N/A 11/18/2016   Procedure: XI ROBOTIC ASSISTED TOTAL HYSTERECTOMY WITH BILATERAL SALPINGO OOPHORECTOMY;  Surgeon: Everitt Amber, MD;  Location: [facility];  Service: Gynecology;  Laterality: N/A;  . SENTINEL NODE BIOPSY N/A 11/18/2016   Procedure: SENTINEL NODE BIOPSY;  Surgeon: Everitt Amber, MD;  Location: [facility];  Service: Gynecology;  Laterality: N/A;    Past Medical Hx:  Past Medical History:  Diagnosis Date  . Cancer (HCC)    endometrial  . Headache   . History of radiation therapy 03/02/17-03/16/17   vaginal cuff treated to 18 Gy with 3 fractions of 6 Gy  . Localized swelling of both lower legs    wears compression hose  . Phlebitis alone left leg  4-5 yrs ago    Past Gynecological History:  G0 No LMP recorded. Patient has had a hysterectomy.  Family Hx:  Family History  Problem Relation Age of Onset  . Lung cancer Father   . Breast cancer Sister     Review of Systems:  Constitutional  Feels well,    ENT Normal appearing ears and nares bilaterally Skin/Breast  No rash, sores, jaundice, itching, dryness Cardiovascular  No chest pain, shortness of breath, or edema  Pulmonary  No cough or wheeze.  Gastro Intestinal  No nausea, vomitting, or diarrhoea. No bright red blood per rectum, no abdominal pain, change in bowel movement, or constipation.  Genito Urinary  No frequency, urgency, dysuria, no postmenopausal bleeding, + vaginal dyscharge Musculo Skeletal  No myalgia, arthralgia, joint swelling or pain  Neurologic  No weakness, numbness,  change in gait,  Psychology  No depression, anxiety, insomnia.   Vitals:  Blood pressure 132/64, pulse (!) 122, temperature 98.7 F (37.1 C), temperature source Temporal, resp. rate 16, height '5\' 6"'$  (1.676 m), weight 230 lb 12.8 oz (104.7 kg), SpO2 98 %.  Physical Exam: WD in NAD Neck  Supple NROM, without any enlargements.  Lymph Node Survey No cervical supraclavicular or inguinal adenopathy Cardiovascular  Pulse normal rate, regularity and rhythm. S1 and S2 normal.  Lungs  Clear to auscultation bilateraly, without wheezes/crackles/rhonchi. Good air movement.  Skin  No rash/lesions/breakdown  Psychiatry  Alert and oriented to person, place, and time  Abdomen  Normoactive bowel sounds, abdomen soft, non-tender and obese without evidence of hernia.  Back No CVA tenderness Genito Urinary  Vulva/vagina: Normal external female genitalia.  No lesions. No discharge or bleeding.  Bladder/urethra:  No lesions or masses, well supported bladder  Vagina: smooth, no lesions, no masses, no blood. Palpably normal with no palpable pelvic masses. No source of bleeding identified.   Adnexa: no palpable masses. Rectal  Good tone, no masses no cul de sac nodularity.  Extremities  No bilateral cyanosis, clubbing or edema.  Thereasa Solo, MD  02/29/2020, 12:19 PM

## 2020-02-29 NOTE — Patient Instructions (Signed)
The examination of the vagina was normal. There is no cancer recurrence in the vagina or source of bleeding. The bleeding may have been from the rectum (for example from hemorrhoids). Continue to use stool softeners and laxatives to keep yourself regular and avoid straining of hard stools.  Continue follow-up with Dr Hinton Rao for treatment of the endometrial cancer.

## 2020-04-24 DIAGNOSIS — C541 Malignant neoplasm of endometrium: Secondary | ICD-10-CM

## 2020-04-24 DIAGNOSIS — C7800 Secondary malignant neoplasm of unspecified lung: Secondary | ICD-10-CM

## 2020-06-05 DIAGNOSIS — C541 Malignant neoplasm of endometrium: Secondary | ICD-10-CM

## 2020-09-19 ENCOUNTER — Inpatient Hospital Stay: Payer: PRIVATE HEALTH INSURANCE | Attending: Hematology and Oncology | Admitting: Hematology and Oncology

## 2020-09-19 ENCOUNTER — Other Ambulatory Visit: Payer: Self-pay

## 2020-09-19 ENCOUNTER — Encounter: Payer: Self-pay | Admitting: Hematology and Oncology

## 2020-09-19 ENCOUNTER — Other Ambulatory Visit: Payer: Self-pay | Admitting: Hematology and Oncology

## 2020-09-19 ENCOUNTER — Telehealth: Payer: Self-pay

## 2020-09-19 ENCOUNTER — Inpatient Hospital Stay: Payer: PRIVATE HEALTH INSURANCE

## 2020-09-19 VITALS — BP 169/86 | HR 102 | Temp 98.1°F | Resp 18 | Ht 64.5 in | Wt 220.0 lb

## 2020-09-19 DIAGNOSIS — Z801 Family history of malignant neoplasm of trachea, bronchus and lung: Secondary | ICD-10-CM | POA: Insufficient documentation

## 2020-09-19 DIAGNOSIS — R42 Dizziness and giddiness: Secondary | ICD-10-CM

## 2020-09-19 DIAGNOSIS — C541 Malignant neoplasm of endometrium: Secondary | ICD-10-CM | POA: Diagnosis not present

## 2020-09-19 DIAGNOSIS — C7931 Secondary malignant neoplasm of brain: Secondary | ICD-10-CM | POA: Insufficient documentation

## 2020-09-19 DIAGNOSIS — C7801 Secondary malignant neoplasm of right lung: Secondary | ICD-10-CM

## 2020-09-19 DIAGNOSIS — R110 Nausea: Secondary | ICD-10-CM

## 2020-09-19 DIAGNOSIS — Z79899 Other long term (current) drug therapy: Secondary | ICD-10-CM | POA: Insufficient documentation

## 2020-09-19 DIAGNOSIS — G4452 New daily persistent headache (NDPH): Secondary | ICD-10-CM

## 2020-09-19 DIAGNOSIS — Z803 Family history of malignant neoplasm of breast: Secondary | ICD-10-CM | POA: Insufficient documentation

## 2020-09-19 DIAGNOSIS — C7802 Secondary malignant neoplasm of left lung: Secondary | ICD-10-CM

## 2020-09-19 HISTORY — DX: Secondary malignant neoplasm of brain: C79.31

## 2020-09-19 LAB — BASIC METABOLIC PANEL
BUN: 17 (ref 4–21)
CO2: 28 — AB (ref 13–22)
Chloride: 98 — AB (ref 99–108)
Creatinine: 0.7 (ref 0.5–1.1)
Glucose: 108
Potassium: 3.9 (ref 3.4–5.3)
Sodium: 139 (ref 137–147)

## 2020-09-19 LAB — CBC AND DIFFERENTIAL
HCT: 41 (ref 36–46)
Hemoglobin: 13.1 (ref 12.0–16.0)
Neutrophils Absolute: 4.69
Platelets: 231 (ref 150–399)
WBC: 6.6

## 2020-09-19 LAB — HEPATIC FUNCTION PANEL
ALT: 22 (ref 7–35)
AST: 38 — AB (ref 13–35)
Alkaline Phosphatase: 72 (ref 25–125)
Bilirubin, Total: 0.7

## 2020-09-19 LAB — COMPREHENSIVE METABOLIC PANEL
Albumin: 4.7 (ref 3.5–5.0)
Calcium: 10.1 (ref 8.7–10.7)

## 2020-09-19 LAB — CBC: RBC: 4.62 (ref 3.87–5.11)

## 2020-09-19 MED ORDER — DEXAMETHASONE 4 MG PO TABS: 4.0000 mg | ORAL_TABLET | Freq: Two times a day (BID) | ORAL | 0 refills | Status: DC

## 2020-09-19 NOTE — Telephone Encounter (Addendum)
Pt's sister,Ava Harrington, has called to report that Eiliyah has had minimally controlled nausea x 1 wk. She takes the antiemetic, but it doesn't last long. She saw PCP @ [facility], who gave her prednisone for sever headaches as well. Please advise. Wilder M,PA, asked if pt could be here @ 1p for labs, & 130 pm to see her. Pt will be here. I will notify Anette Guarneri.

## 2020-09-19 NOTE — Progress Notes (Signed)
[facility]  [address] [place],  [place]  [address] [phone]  Clinic Day:  09/19/2020  Referring physician: Emmaline Kluver, *   CHIEF COMPLAINT:  CC:  Recurrent endometrial carcinoma with lung metastasis.  Current Treatment:    Patient is currently on observation.   HISTORY OF PRESENT ILLNESS:  Brianna Herring is a 64 y.o. female with recurrent endometrial carcinoma with lung metastasis.  She was originally diagnosed with stage IB (T1b N0 M0) high-grade endometrial carcinoma in late 2017. She had a robotic hysterectomy and bilateral salpingo-oophorectomy in January 2018.  Pathology revealed a 4 cm, grade 3, endometrioid adenocarcinoma with over 50% invasion of the myometrium and lymphovascular invasion.  Six lymph nodes were negative for metastasis.  Margins were clear.  She received adjuvant external beam radiation, completed in April 2018, followed by vaginal brachytherapy to the vaginal cuff.   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  MSI was high.  A referral to [facility] was ordered in 2018, but the patient never followed through.   CT chest, abdomen and pelvis prior to surgery revealed subtle low-attenuation in the central uterus, as well as small bibasilar pulmonary nodules, which were suspicious for metastatic disease, but only up to 6 mm in size.  PET scan in February 2018 did not reveal hypermetabolic activity in the lung nodules or in the left inguinal node or portacaval node.  There was mild uptake of the pelvic sidewall, which was felt to represent postoperative changes.  The patient was seen in routine follow-up in March 2019.  Repeat CT chest revealed new pulmonary nodules measuring up to 1.8 cm in the right middle lobe and not in the area of the prior tiny nodules, with a 1.5 cm left lower lobe cavitary nodule.  The previously seen changes of the pelvic sidewall on the  prior PET scan had resolved.  Dr. Denman George recommended chemotherapy with carboplatin/paclitaxel for at least 6 cycles, depending on her response, and referred her to [facility] center.  PET in May revealed hypermetabolic activity within the pulmonary nodules consistent with metastatic disease.  There was mild hypermetabolic activity within the portacaval node, which was still felt to be reactive.  When she presented for chemotherapy education on May 29, she was accompanied by her sister and requested a referral to a pulmonologist for a definitive tissue diagnosis.  She saw Dr. Melvyn Novas at [facility] pulmonology, who went over all the risks and benefits of having a bronchoscopy.  The patient decided it was not worth the risk to undergo biopsy and decided to proceed with chemotherapy.  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  We did obtain MLH1 methylation analysis and there was hypermethylation of MLH1, so this does not likely represent Lynch syndrome.   She started palliative carboplatin/paclitaxel in early June 2019 and completed 6 cycles in September.  Repeat CT imaging after 3 cycles of carboplatin/paclitaxel revealed a good response with a decrease in the size and number of pulmonary nodules.  No new areas of metastatic disease were seen.  She had chronic hematuria and saw Dr. Nila Nephew.  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  She has also had recurrent urinary tract infections, so was placed on D-mannose for prevention.  CT chest, abdomen and pelvis in October 2019 revealed stable to slightly  decreased metastatic lung lesions with no other sites of metastatic disease.  Chemotherapy was discontinued and she was placed on tamoxifen 20 mg daily for maintenance.  Repeat CT scans in January 2020 revealed scattered small bilateral pulmonary nodules that were overall grossly  unchanged, including a 10 mm irregular nodule of the right upper lobe and two 6 mm nodules of the right lower lobe.  CT imaging in April was fairly stable with just a slight increase in the right middle lobe pulmonary nodule, which was difficult to assess due to motion artifact.  Unfortunately, CT imaging in August revealed considerable enlargement of the right middle lobe pulmonary nodule from 1 cm to 2.3 cm consistent with progressive pulmonary metastasis.  CT at the time of biopsy revealed the right middle lobe pulmonary nodule to measure approximately 3 cm.  Biopsy of the increased pulmonary nodule confirmed metastatic endometrial adenocarcinoma.  [facility] CDx testing reveals MSI high with an increased TMB (tumor mutational burden) making the patient eligible for immunotherapy.  Tamoxifen was discontinued.  She had hypokalemia despite potassium chloride 10 mEq daily, so this was increased to twice daily.  She was placed on palliative pembrolizumab every 3 weeks and had her 1st cycle on October 1st.  She presented to the emergency department with shortness of breath in November 2020.  CT angio chest revealed small filling defects in the right lower lobe and probably right upper lobe pulmonary arteries consistent with small pulmonary emboli.  The metastatic lesion within the right middle lobe was stable at 3 cm.  There was scattered subsegmental atelectasis.  The patient was hospitalized at that time, as she presented with hypoxia.  She initially was given enoxaparin and then transitioned to rivaroxaban.  Prior to a 4th cycle of pembrolizumab, she had borderline hypokalemia despite potassium chloride 10 mEq twice daily, so was instructed to increase potassium in her diet.  Her potassium was normal at her visit in December.  After completion of 6 cycles of pembrolizumab, she had progressive disease within the chest.  CT chest, abdomen, and pelvis from February 3rd, 2021 revealed a dramatic increase in the  size of the dominant right middle lobe pulmonary nodule, now measuring 5.1 x 5.1 cm, previously measuring 2.4 x 2.3 cm.  The smaller pulmonary nodules in the right lower lobe were stable, but one new pulmonary nodule was seen within the central right middle lobe measuring 14 mm.  There was no evidence of local recurrence of endometrial carcinoma in the pelvis or evidence of metastatic disease in the abdomen or pelvis.  The bladder, kidneys and ureters appeared normal.  In light of these results, her treatment was changed to carboplatin/paclitaxel/bevacizumab.  She had worsening hypokalemia, so was instructed to increase her potassium chloride to 3 times daily.    She had her 1st cycle of carboplatin/paclitaxel/bevacizumab on February 12th, 2021.  She received Neulasta with each cycle to prevent complications of prolonged neutropenia.  She was seen on February 26th to see how she tolerated her 1st cycle.  At that time, she reported a dry cough, felt to be due to her malignancy and was instructed to use Robitussin as needed.  She  Had been seen in the emergency department on February 20th with dyspnea and chest pain radiating around the chest wall, that was cramping in nature.  Chest x-ray did not reveal any acute abnormality.  She was given muscle relaxers and those relieved her pain.  Bevacizumab was held with her 2nd cycle.  She presented  to the emergency department with vaginal bleeding on March 16th.  This was felt to be hematuria due to urinary tract infection, but we felt she likely had vaginal bleeding as well.  Rivaroxaban was held during her hospitalization.  She was found to have Klebsiella pneumoniae in the urine and was discharged on Omnicef.  She had anemia felt to be secondary to blood loss and her hemoglobin dropped to 8.8, but was back up to 9.5 at the time of discharge.  She had tachycardia and an EKG in the emergency department revealed sinus tachycardia with nonspecific changes.  CT chest  angiogram did not reveal any evidence of pulmonary embolism.  There was an interval decrease in the pulmonary nodules.  The rivaroxaban was resumed on discharge.  Bevacizumab was permanently discontinued due to bleeding.  She had worsening anemia.  Evaluation did not reveal any nutritional deficiency contributing to her anemia.  We had her see Dr. Denman George as she had not had a vaginal exam in some time.  No vaginal mass or source of bleeding was identified on vaginal examination.  Prior to a 5th cycle of carboplatin/paclitaxel, she had recurrent bleeding, so Dr. Venetia Maxon had held her rivaroxaban.  She was also found to have urinary tract infection with Staphylococcal hemolyticus, which was treated with nitrofurantoin.  We tried resuming rivaroxaban at a lower dose of 15 mg daily, due to her continued risk for thrombosis with metastatic disease.  She was having hematuria/vaginal bleeding and epistaxis, that she attributed to the chemotherapy.  Due to these symptoms, rivaroxaban was discontinued.  She did have a transfusion of 2 units of PRBC's in late May.    CT chest, abdomen and pelvis  in June revealed an excellent response to treatment.  The large right middle lobe mass had resolved, and the right lower lobe pulmonary nodules remain stable at 5 mm.  There was evidence of metastatic disease in the abdomen or pelvis.  She had had right calf tenderness, redness and warmth and was seen by by Dr. Venetia Maxon.  Imaging was negative for DVT.  He felt this represented cellulitis, so treated her with cephalexin 500 mg every 6 hours for 5 days.  We extended that course for an additional 10 days in mid June in view of her immunosuppression.  She had significant improvement in her hemoglobin in July.  She still had borderline  hypokalemia despite potassium chloride 20 mEq 3 times daily, so we recommended she increase this to 4 times daily.  CT chest on September 20th revealed stable small pulmonary nodules without evidence of  metastatic disease within the abdomen and pelvis.  She was doing well at the time, so was scheduled for 3 month follow up with repeat imaging.  INTERVAL HISTORY:  Marveen is seen today, as her sister, Ava, telephoned reporting the patient reported chronic nausea for least a week, as well as dizziness and headache. The patient reports 1 episode of vomiting last week.  She states she has had a headache at the back of her head along with the dizziness and nausea for about a week.  The patient states she then began haivng neck and shoulder pain, so saw her primary care provider, Dr. Lin Landsman, yesterday .  He placed her on Medrol Dosepak and recommended that they contact our office.  The patient states that her symptoms are improved just with one day of the steroid. She denies fevers or chills. She denies pain.   She states she has had a decreased appetite  due to the nausea. Her weight has decreased 5 pounds over last 2 months. The patient states she has continued to work most days despite her symptoms.  She states she has had a COVID-19 booster.  REVIEW OF SYSTEMS:  Review of Systems  Constitutional: Positive for appetite change. Negative for chills, fatigue and fever.  HENT:   Negative for mouth sores and sore throat.   Respiratory: Negative for cough and shortness of breath.   Cardiovascular: Negative for chest pain and leg swelling.  Gastrointestinal: Positive for nausea and vomiting (single episode last week). Negative for abdominal pain, constipation and diarrhea.  Genitourinary: Negative for difficulty urinating.   Musculoskeletal: Negative for gait problem.  Neurological: Positive for dizziness and headaches (occipital area). Negative for extremity weakness, gait problem, numbness, seizures and speech difficulty.  Psychiatric/Behavioral: Negative for depression. The patient is not nervous/anxious.      VITALS:  Blood pressure (!) 169/86, pulse (!) 102, temperature 98.1 F (36.7 C), temperature  source Oral, resp. rate 18, height 5' 4.5" (1.638 m), weight 220 lb (99.8 kg), SpO2 98 %.  Wt Readings from Last 3 Encounters:  09/19/20 220 lb (99.8 kg)  02/29/20 230 lb 12.8 oz (104.7 kg)  10/02/18 235 lb 8 oz (106.8 kg)    Body mass index is 37.18 kg/m.  Performance status (ECOG): 1 - Symptomatic but completely ambulatory  PHYSICAL EXAM:  Physical Exam Constitutional:      General: She is not in acute distress.    Appearance: Normal appearance.  HENT:     Mouth/Throat:     Mouth: Mucous membranes are moist.     Pharynx: Oropharynx is clear.  Eyes:     General: No scleral icterus.    Extraocular Movements: Extraocular movements intact.     Conjunctiva/sclera: Conjunctivae normal.     Pupils: Pupils are equal, round, and reactive to light.  Cardiovascular:     Rate and Rhythm: Normal rate and regular rhythm.     Heart sounds: Normal heart sounds. No murmur heard.  No friction rub. No gallop.   Pulmonary:     Breath sounds: Normal breath sounds. No wheezing, rhonchi or rales.  Abdominal:     General: There is no distension.     Palpations: Abdomen is soft. There is no hepatomegaly, splenomegaly or mass.     Tenderness: There is no abdominal tenderness.  Musculoskeletal:     Cervical back: Normal range of motion and neck supple. No tenderness.  Neurological:     General: No focal deficit present.     Mental Status: She is alert and oriented to person, place, and time.     Cranial Nerves: No cranial nerve deficit, dysarthria or facial asymmetry.     Sensory: Sensation is intact.     Motor: Motor function is intact. No weakness, tremor or pronator drift.     Coordination: Coordination is intact. Romberg sign negative. Coordination normal. Finger-Nose-Finger Test and Heel to [facility] Test normal. Rapid alternating movements normal.     Gait: Gait normal.   Lymph nodes:   There is no cervical, clavicular, axillary or inguinal lymphadenopathy.   LABS:   CBC Latest Ref Rng &  Units 09/19/2020 08/13/2018 11/19/2016  WBC - 6.6 3.0(L) 6.9  Hemoglobin 12.0 - 16.0 13.1 7.7(L) 11.8(L)  Hematocrit 36 - 46 41 24.7(L) 36.1  Platelets 150 - 399 231 65(L) 234   CMP Latest Ref Rng & Units 09/19/2020 08/13/2018 01/18/2018  Glucose 70 - 99 mg/dL - 126(H) 91  BUN 4 - '21 17 11 8  ' Creatinine 0.5 - 1.1 0.7 0.71 0.79  Sodium 137 - 147 139 138 140  Potassium 3.4 - 5.3 3.9 2.8(L) 3.9  Chloride 99 - 108 98(A) 101 102  CO2 13 - 22 28(A) 26 29  Calcium 8.7 - 10.7 10.1 7.6(L) 9.8  Total Protein 6.4 - 8.3 g/dL - - -  Total Bilirubin 0.20 - 1.20 mg/dL - - -  Alkaline Phos 25 - 125 72 - -  AST 13 - 35 38(A) - -  ALT 7 - 35 22 - -     No results found for: CEA1 / No results found for: CEA1 No results found for: PSA1 No results found for: WOE321 No results found for: YYQ825  No results found for: TOTALPROTELP, ALBUMINELP, A1GS, A2GS, BETS, BETA2SER, GAMS, MSPIKE, SPEI No results found for: TIBC, FERRITIN, IRONPCTSAT No results found for: LDH  STUDIES:  No results found.    HISTORY:   Past Medical History:  Diagnosis Date  . Cancer (HCC)    endometrial  . Headache   . History of radiation therapy 03/02/17-03/16/17   vaginal cuff treated to 18 Gy with 3 fractions of 6 Gy  . Localized swelling of both lower legs    wears compression hose  . Phlebitis alone left leg  4-5 yrs ago    Past Surgical History:  Procedure Laterality Date  . NO PAST SURGERIES    . ROBOTIC ASSISTED TOTAL HYSTERECTOMY WITH BILATERAL SALPINGO OOPHERECTOMY N/A 11/18/2016   Procedure: XI ROBOTIC ASSISTED TOTAL HYSTERECTOMY WITH BILATERAL SALPINGO OOPHORECTOMY;  Surgeon: Everitt Amber, MD;  Location: [facility] ORS;  Service: Gynecology;  Laterality: N/A;  . SENTINEL NODE BIOPSY N/A 11/18/2016   Procedure: SENTINEL NODE BIOPSY;  Surgeon: Everitt Amber, MD;  Location: [facility] OR[facility];  Service: Gynecology;  Laterality: N/A;    Family History  Problem Relation Age of Onset  . Lung cancer Father   . Breast cancer Sister      Social History:  reports that she has never smoked. She has never used smokeless tobacco. She reports that she does not drink alcohol and does not use drugs.The patient is accompanied by her sister Ava today.  Allergies: No Known Allergies  Current Medications: Current Outpatient Medications  Medication Sig Dispense Refill  . calcium carbonate (CALTRATE 600) 1500 (600 Ca) MG TABS tablet Take 1,500 tablets by mouth 2 (two) times daily.     Marland Kitchen dexamethasone (DECADRON) 4 MG tablet Take 1 tablet (4 mg total) by mouth 2 (two) times daily with a meal. 60 tablet 0  . loratadine (CLARITIN) 10 MG tablet Take 10 mg by mouth daily.    . metroNIDAZOLE (FLAGYL) 500 MG tablet Take 1 tablet (500 mg total) by mouth 3 (three) times daily. (Patient not taking: Reported on 02/29/2020) 21 tablet 1  . Multiple Vitamins-Minerals (CENTRUM SILVER PO) Take 1 tablet by mouth daily.     . potassium chloride (KLOR-CON) 10 MEQ tablet Take 10 mEq by mouth 2 (two) times daily.    . Potassium Chloride ER 20 MEQ TBCR Take 1 tablet by mouth daily.  2  . Probiotic Product (PROBIOTIC PO) Take 1 capsule by mouth daily.    . tamoxifen (NOLVADEX) 20 MG tablet Take 20 mg by mouth daily.  3  . XARELTO 20 MG TABS tablet Take 20 mg by mouth daily.     No current facility-administered medications for this visit.     ASSESSMENT & PLAN:   Assessment/Plan:  1. History of stage IB high-grade endometrial carcinoma treated with hysterectomy/bilateral salpingo-oophorectomy, followed by adjuvant radiation and brachytherapy in 2018. 2. Recurrent endometrial carcinoma with metastases to the lung in May 2019 treated with carboplatin/paclitaxel for 6 cycles, followed by maintenance tamoxifen.  She had progression of her disease in August 2020. As her tumor was MSI-high, she was placed on palliative pembrolizumab.  Unfortunately, she had progressive disease after 6 cycles of pembrolizumab.  She was therefore placed on palliative  carboplatin/paclitaxel/bevacizumb.  Bevacizumab was discontinued due to persistent vaginal bleeding/hematuria and epistaxis.  She completed 6 cycles of carboplatin/paclitaxel in May 2020. The right lung pulmonary nodules are biopsy proven to represent metastatic disease.  CT imaging in September was stable. 3. Pulmonary emboli in February 2021.  She is at increased risk for thrombosis due to her malignancy, but due to recurrent bleeding, we discontinued rivaroxaban.  There was no evidence of pulmonary emboli on the CT in September. 4. Hypokalemia, resolved with oral supplementation, which she will continue. 5. New onset chronic headaches, nausea and dizziness concerning for the possibility of intracranial metastasis.  A CT head with and without contrast was obtained and unfortunately revealed a solitary 2.8 cm necrotic mass in the right cerebellum consistent with necrotic metastasis.  There was surrounding edema and mass effect upon the 4th ventricle without obstructive hydrocephalus.  Chronic small-vessel changes of the white matter were seen. 6. Solitary right cerebellar metastasis.  This could potentially be treated with surgical resection or stereotactic radiation.  We will place her on dexamethasone 4 mg twice daily and have her stop the Medrol Dosepak.  We will plan to refer her to Dr. Mickeal Skinner Neuro-Oncology to coordinate treatment.  The patient and her sister understand the plans discussed today and are in agreement with them.  They know to contact our office if they develop other concerns prior to her next appointment.  The patient was seen and plan formulated with Dr. Hinton Rao.  40 minutes was spent with the patient today greater than 50% of which was in face-to-face discussion regarding the assessment and plan.   [name], PA-C

## 2020-09-20 ENCOUNTER — Telehealth: Payer: Self-pay | Admitting: Hematology and Oncology

## 2020-09-20 NOTE — Telephone Encounter (Signed)
No Appt's Ordered per 11/12 LOS

## 2020-09-22 ENCOUNTER — Other Ambulatory Visit: Payer: Self-pay | Admitting: Radiation Therapy

## 2020-09-23 ENCOUNTER — Telehealth: Payer: Self-pay | Admitting: Internal Medicine

## 2020-09-23 ENCOUNTER — Other Ambulatory Visit: Payer: Self-pay | Admitting: Hematology and Oncology

## 2020-09-23 DIAGNOSIS — C541 Malignant neoplasm of endometrium: Secondary | ICD-10-CM

## 2020-09-23 DIAGNOSIS — C7931 Secondary malignant neoplasm of brain: Secondary | ICD-10-CM

## 2020-09-23 NOTE — Telephone Encounter (Signed)
Received a new pt referral from Rosanne Sack, [place] from [facility] for the pt to see Dr. Mickeal Skinner on 11/29 at 11am. Appt date and time has been given to the pt's sister Ava. Aware to  rrive 15 minutes early.

## 2020-09-25 ENCOUNTER — Encounter: Payer: Self-pay | Admitting: Hematology and Oncology

## 2020-09-26 ENCOUNTER — Ambulatory Visit (HOSPITAL_COMMUNITY)
Admission: RE | Admit: 2020-09-26 | Discharge: 2020-09-26 | Disposition: A | Discharge status: Home / Self Care | Payer: No Typology Code available for payment source | Source: Ambulatory Visit | Attending: Hematology and Oncology | Admitting: Hematology and Oncology

## 2020-09-26 ENCOUNTER — Encounter (HOSPITAL_COMMUNITY): Payer: Self-pay | Admitting: *Deleted

## 2020-09-26 ENCOUNTER — Other Ambulatory Visit: Payer: Self-pay

## 2020-09-26 DIAGNOSIS — C541 Malignant neoplasm of endometrium: Secondary | ICD-10-CM | POA: Diagnosis present

## 2020-09-26 DIAGNOSIS — C7931 Secondary malignant neoplasm of brain: Secondary | ICD-10-CM | POA: Diagnosis present

## 2020-09-26 MED ORDER — GADOBUTROL 1 MMOL/ML IV SOLN
10.0000 mL | Freq: Once | INTRAVENOUS | Status: AC | PRN
  Administered 2020-09-26: 10 mL via INTRAVENOUS

## 2020-09-29 ENCOUNTER — Inpatient Hospital Stay (HOSPITAL_BASED_OUTPATIENT_CLINIC_OR_DEPARTMENT_OTHER): Payer: PRIVATE HEALTH INSURANCE | Admitting: Internal Medicine

## 2020-09-29 ENCOUNTER — Other Ambulatory Visit: Payer: Self-pay | Admitting: Neurosurgery

## 2020-09-29 ENCOUNTER — Other Ambulatory Visit: Payer: Self-pay

## 2020-09-29 VITALS — BP 145/81 | HR 75 | Temp 99.0°F | Resp 18 | Ht 66.0 in | Wt 222.3 lb

## 2020-09-29 DIAGNOSIS — Z801 Family history of malignant neoplasm of trachea, bronchus and lung: Secondary | ICD-10-CM | POA: Diagnosis not present

## 2020-09-29 DIAGNOSIS — C541 Malignant neoplasm of endometrium: Secondary | ICD-10-CM | POA: Diagnosis not present

## 2020-09-29 DIAGNOSIS — C7931 Secondary malignant neoplasm of brain: Secondary | ICD-10-CM | POA: Diagnosis not present

## 2020-09-29 DIAGNOSIS — Z803 Family history of malignant neoplasm of breast: Secondary | ICD-10-CM | POA: Diagnosis not present

## 2020-09-29 DIAGNOSIS — Z6835 Body mass index (BMI) 35.0-35.9, adult: Secondary | ICD-10-CM

## 2020-09-29 DIAGNOSIS — Z79899 Other long term (current) drug therapy: Secondary | ICD-10-CM | POA: Diagnosis not present

## 2020-09-29 HISTORY — DX: Body mass index (BMI) 35.0-35.9, adult: Z68.35

## 2020-09-29 NOTE — Progress Notes (Signed)
Histology and Location of Primary Cancer:  Grade 3 endometrioid endometrial adenocarcinoma with a 4cm tumor, 1.8 of 2.3cm (80%) myometrial invasion with LVSI present  Location/Histology of Brain Tumor:  RIGHT cerebellar mass   Patient presented with symptoms of:  New onset chronic headaches, nausea and dizziness  MRI Brain w/ and w/o Contrast  -09/26/2020 IMPRESSION: 1. 2.7 cm right cerebellar mass consistent with a metastasis. Moderate vasogenic edema with partial effacement of the fourth ventricle but no evidence of obstructive hydrocephalus. 2. 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 Attention on follow-up. 3. Mild-to-moderate chronic small vessel ischemic disease.  Past or anticipated interventions, if any, per neurosurgery: Scheduled for craniotomy on 10/08/2020 by Dr. Duffy Rhody  Past or anticipated interventions, if any, per medical oncology: Dr. Cecil Cobbs 09/29/2020 -Clinical symptoms are well controlled with corticosteroids. -Case was discussed extensively in brain/spine tumor board this AM; we feel she is a good candidate for pre-operative SRS and craniotomy. -Case was discussed with Dr. Marcello Moores who is willing to perform surgery and meet with her in person later today.   -Recommended decreasing decadron to 42m daily due to insomnia. -Surgery and RT will not occur for at least another week.  Dose can be titrated down post-operatively  Dr. CHosie Poisson([facility] 1. History of stage IB high-grade endometrial carcinoma treated with hysterectomy/bilateral salpingo-oophorectomy, followed by adjuvant radiation and brachytherapy in 2018. 2. Recurrent endometrial carcinoma with metastases to the lung in May 2019 treated with carboplatin/paclitaxel for 6 cycles, followed by maintenance tamoxifen.  She had progression of her disease in August 2020. As her tumor was MSI-high, she was placed on palliative pembrolizumab.   Unfortunately, she had progressive disease after 6 cycles of pembrolizumab.  She was therefore placed on palliative carboplatin/paclitaxel/bevacizumb.  Bevacizumab was discontinued due to persistent vaginal bleeding/hematuria and epistaxis.  She completed 6 cycles of carboplatin/paclitaxel in May 2020. The right lung pulmonary nodules are biopsy proven to represent metastatic disease.  CT imaging in September was stable. 3. Pulmonary emboli in February 2021.  She is at increased risk for thrombosis due to her malignancy, but due to recurrent bleeding, we discontinued rivaroxaban.  There was no evidence of pulmonary emboli on the CT in September. 4. Hypokalemia, resolved with oral supplementation, which she will continue. 5. New onset chronic headaches, nausea and dizziness concerning for the possibility of intracranial metastasis.  A CT head with and without contrast was obtained and unfortunately revealed a solitary 2.8 cm necrotic mass in the right cerebellum consistent with necrotic metastasis.  There was surrounding edema and mass effect upon the 4th ventricle without obstructive hydrocephalus.  Chronic small-vessel changes of the white matter were seen. 6. Solitary right cerebellar metastasis.  This could potentially be treated with surgical resection or stereotactic radiation.  We will place her on dexamethasone 4 mg twice daily and have her stop the Medrol Dosepak.  We will plan to refer her to Dr. VMickeal SkinnerNeuro-Oncology to coordinate treatment.  Dose of Decadron, if applicable: 4 mg tablet daily (decreased from 4 mg BID)  Recent neurologic symptoms, if any:   Seizures: Patient denies  Headaches: Patient denies  Nausea: Patient denies, but does report a decrease in appetite  Dizziness/ataxia: Patient denies  Difficulty with hand coordination: Patient denies  Focal numbness/weakness: Patient denies  Visual deficits/changes: Patient denies  Confusion/Memory deficits: Reports difficulty  with short and long term memory   Ambulatory status? Walker? Wheelchair?: Patient is reports she is able to  ambulate unassisted   Pain on a scale of 0-10 is: 0 (patient denies any pain/discomfort)   SAFETY ISSUES:  Prior radiation? Yes (Dr. Jeneen Rinks Kinard)--03/02/2017-03/16/2017: Vaginal cuff treated to 18 Gy with 3 fractions of 6 Gy  Pacemaker/ICD? No  Possible current pregnancy? No--hysterectomy in 2018  Is the patient on methotrexate? No  Additional Complaints / other details: Nothing else of note

## 2020-09-29 NOTE — Progress Notes (Signed)
[facility] [place], [place] [address] [phone]   New Patient Evaluation  Date of Service: 09/29/20 Patient Name: Brianna Herring Patient MRN: [id number] Patient DOB: 09/09/1956 Provider: Ventura Sellers, MD  Identifying Statement:  Brianna Herring is a 64 y.o. female with Brain metastasis ([place]) [C79.31] who presents for initial consultation and evaluation regarding cancer associated neurologic deficits.    Referring Provider: Street, [name], MD [place],  [place] [address]  Primary Cancer: Endometrial, Stage IV  History of Present Illness: The patient's records from the referring physician were obtained and reviewed and the patient interviewed to confirm this HPI.  Brianna Herring presented to medical attention with new onset complaint of headache and gait instability, earlier this month.  Symptoms had been progressive over several weeks, at least.  Headaches were holocranial and occurring in the AM.  Gait issues were non specific, she never needed gait assist aside from "holding on" to family and objects.  Decadron was started by Dr. Hinton Rao, this led to considerable improvement and essentially resolution of clinical complaints.  She is currently dosing 4mg  twice per day.    Medications: Current Outpatient Medications on File Prior to Visit  Medication Sig Dispense Refill  . calcium carbonate (CALTRATE 600) 1500 (600 Ca) MG TABS tablet Take 1,500 tablets by mouth 2 (two) times daily.     Marland Kitchen dexamethasone (DECADRON) 4 MG tablet Take 1 tablet (4 mg total) by mouth 2 (two) times daily with a meal. 60 tablet 0  . loratadine (CLARITIN) 10 MG tablet Take 10 mg by mouth daily.    . metroNIDAZOLE (FLAGYL) 500 MG tablet Take 1 tablet (500 mg total) by mouth 3 (three) times daily. (Patient not taking: Reported on 02/29/2020) 21 tablet 1  . Multiple Vitamins-Minerals (CENTRUM SILVER PO) Take  1 tablet by mouth daily.     . potassium chloride (KLOR-CON) 10 MEQ tablet Take 10 mEq by mouth 2 (two) times daily.    . Potassium Chloride ER 20 MEQ TBCR Take 1 tablet by mouth daily.  2  . Probiotic Product (PROBIOTIC PO) Take 1 capsule by mouth daily.    . tamoxifen (NOLVADEX) 20 MG tablet Take 20 mg by mouth daily.  3  . XARELTO 20 MG TABS tablet Take 20 mg by mouth daily.     No current facility-administered medications on file prior to visit.    Allergies: No Known Allergies Past Medical History:  Past Medical History:  Diagnosis Date  . Cancer (HCC)    endometrial  . Headache   . History of radiation therapy 03/02/17-03/16/17   vaginal cuff treated to 18 Gy with 3 fractions of 6 Gy  . Localized swelling of both lower legs    wears compression hose  . Phlebitis alone left leg  4-5 yrs ago   Past Surgical History:  Past Surgical History:  Procedure Laterality Date  . ROBOTIC ASSISTED TOTAL HYSTERECTOMY WITH BILATERAL SALPINGO OOPHERECTOMY N/A 11/18/2016   Procedure: XI ROBOTIC ASSISTED TOTAL HYSTERECTOMY WITH BILATERAL SALPINGO OOPHORECTOMY;  Surgeon: Everitt Amber, MD;  Location: [facility];  Service: Gynecology;  Laterality: N/A;  . SENTINEL NODE BIOPSY N/A 11/18/2016   Procedure: SENTINEL NODE BIOPSY;  Surgeon: Everitt Amber, MD;  Location: [facility];  Service: Gynecology;  Laterality: N/A;   Social History:  Social History   Socioeconomic History  . Marital status: Single    Spouse name: Not on file  .  Number of children: 0  . Years of education: Not on file  . Highest education level: Not on file  Occupational History  . Occupation: sewer  Tobacco Use  . Smoking status: Never Smoker  . Smokeless tobacco: Never Used  Substance and Sexual Activity  . Alcohol use: No  . Drug use: No  . Sexual activity: Never  Other Topics Concern  . Not on file  Social History Narrative  . Not on file   Social Determinants of Health   Financial Resource Strain:   . Difficulty of Paying  Living Expenses: Not on file  Food Insecurity:   . Worried About Charity fundraiser in the Last Year: Not on file  . Ran Out of Food in the Last Year: Not on file  Transportation Needs:   . Lack of Transportation (Medical): Not on file  . Lack of Transportation (Non-Medical): Not on file  Physical Activity:   . Days of Exercise per Week: Not on file  . Minutes of Exercise per Session: Not on file  Stress:   . Feeling of Stress : Not on file  Social Connections:   . Frequency of Communication with Friends and Family: Not on file  . Frequency of Social Gatherings with Friends and Family: Not on file  . Attends Religious Services: Not on file  . Active Member of Clubs or Organizations: Not on file  . Attends Archivist Meetings: Not on file  . Marital Status: Not on file  Intimate Partner Violence:   . Fear of Current or Ex-Partner: Not on file  . Emotionally Abused: Not on file  . Physically Abused: Not on file  . Sexually Abused: Not on file   Family History:  Family History  Problem Relation Age of Onset  . Lung cancer Father   . Breast cancer Sister     Review of Systems: Constitutional: Doesn't report fevers, chills or abnormal weight loss Eyes: Doesn't report blurriness of vision Ears, nose, mouth, throat, and face: Doesn't report sore throat Respiratory: Doesn't report cough, dyspnea or wheezes Cardiovascular: Doesn't report palpitation, chest discomfort  Gastrointestinal:  Doesn't report nausea, constipation, diarrhea GU: Doesn't report incontinence Skin: Doesn't report skin rashes Neurological: Per HPI Musculoskeletal: Doesn't report joint pain Behavioral/Psych: Doesn't report anxiety  Physical Exam: Vitals:   09/29/20 0952  BP: (!) 145/81  Pulse: 75  Resp: 18  Temp: 99 F (37.2 C)  SpO2: 98%   KPS: 80. General: Alert, cooperative, pleasant, in no acute distress Head: Normal EENT: No conjunctival injection or scleral icterus.  Lungs: Resp  effort normal Cardiac: Regular rate Abdomen: Non-distended abdomen Skin: No rashes cyanosis or petechiae. Extremities: No clubbing or edema  Neurologic Exam: Mental Status: Awake, alert, attentive to examiner. Oriented to self and environment. Language is fluent with intact comprehension.  Age advanced psychomotor slowing. Cranial Nerves: Visual acuity is grossly normal. Visual fields are full. Extra-ocular movements intact. No ptosis. Face is symmetric Motor: Tone and bulk are normal. Power is full in both arms and legs. Reflexes are symmetric, no pathologic reflexes present.  Sensory: Intact to light touch Gait: Normal.   Labs: I have reviewed the data as listed    Component Value Date/Time   NA 139 09/19/2020 0000   NA 139 11/03/2016 1023   K 3.9 09/19/2020 0000   K 3.8 11/03/2016 1023   CL 98 (A) 09/19/2020 0000   CO2 28 (A) 09/19/2020 0000   CO2 26 11/03/2016 1023   GLUCOSE  126 (H) 08/13/2018 0236   GLUCOSE 134 11/03/2016 1023   BUN 17 09/19/2020 0000   BUN 11.6 11/03/2016 1023   CREATININE 0.7 09/19/2020 0000   CREATININE 0.71 08/13/2018 0236   CREATININE 0.8 11/03/2016 1023   CALCIUM 10.1 09/19/2020 0000   CALCIUM 9.6 11/03/2016 1023   PROT 7.8 11/03/2016 1023   ALBUMIN 4.7 09/19/2020 0000   ALBUMIN 3.5 11/03/2016 1023   AST 38 (A) 09/19/2020 0000   AST 24 11/03/2016 1023   ALT 22 09/19/2020 0000   ALT 17 11/03/2016 1023   ALKPHOS 72 09/19/2020 0000   ALKPHOS 78 11/03/2016 1023   BILITOT 0.62 11/03/2016 1023   GFRNONAA >60 08/13/2018 0236   GFRAA >60 08/13/2018 0236   Lab Results  Component Value Date   WBC 6.6 09/19/2020   NEUTROABS 4.69 09/19/2020   HGB 13.1 09/19/2020   HCT 41 09/19/2020   MCV 108.8 (H) 08/13/2018   PLT 231 09/19/2020    Imaging:  MR Brain W Wo Contrast  Result Date: 09/26/2020 CLINICAL DATA:  Recurrent high-grade endometrial cancer. EXAM: MRI HEAD WITHOUT AND WITH CONTRAST TECHNIQUE: Multiplanar, multiecho pulse sequences of  the brain and surrounding structures were obtained without and with intravenous contrast. CONTRAST:  12mL GADAVIST GADOBUTROL 1 MMOL/ML IV SOLN COMPARISON:  Head CT 09/19/2020 FINDINGS: Brain: A partially necrotic or cystic right cerebellar mass with irregular nodular peripheral enhancement measures 2.8 x 2.7 cm. There is moderate surrounding vasogenic edema with partial effacement of the fourth ventricle but no evidence of obstructive hydrocephalus. No acute infarct, intracranial hemorrhage, midline shift, or extra-axial fluid collection is identified. There is mild cerebral atrophy. Small T2 hyperintensities scattered throughout the subcortical and deep cerebral white matter bilaterally are nonspecific but compatible with mild-to-moderate chronic small vessel ischemic disease. 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 There is asymmetric enlargement of the pituitary gland on the right with evidence of an underlying 6 mm enhancing mass. Vascular: Major intracranial vascular flow voids are preserved. Skull and upper cervical spine: Unremarkable bone marrow signal. Sinuses/Orbits: Unremarkable orbits. Paranasal sinuses and mastoid air cells are clear. Other: None. IMPRESSION: 1. 2.7 cm right cerebellar mass consistent with a metastasis. Moderate vasogenic edema with partial effacement of the fourth ventricle but no evidence of obstructive hydrocephalus. 2. 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 Attention on follow-up. 3. Mild-to-moderate chronic small vessel ischemic disease. Electronically Signed   By: Logan Bores M.D.   On: 09/26/2020 17:35    Assessment/Plan Brain metastasis ([place]) [C79.31]  Brianna Herring presents with clinical and radiographic syndrome localizing to the posterior fossa, consistent with metastatic deposit from either endometrial carcinoma or secondary malignancy.  Systemically she has  been stable and on observation, CNS development is new.  Clinical symptoms are well controlled with corticosteroids.  Case was discussed extensively in brain/spine tumor board this AM; we feel she is a good candidate for pre-operative SRS and craniotomy.  Case was discussed with Dr. Marcello Moores who is willing to perform surgery and meet with her in person later today.    Recommended decreasing decadron to 4mg  daily due to insomnia.  Surgery and RT will not occur for at least another week.  Dose can be titrated down post-operatively.    We spent twenty additional minutes teaching regarding the natural history, biology, and historical experience in the treatment of neurologic complications of cancer.   We appreciate the opportunity  to participate in the care of Naryiah Schley.  She will follow up in clinic the week after craniotomy.  All questions were answered. The patient knows to call the clinic with any problems, questions or concerns. No barriers to learning were detected.  The total time spent in the encounter was 45 minutes and more than 50% was on counseling and review of test results   Ventura Sellers, MD Medical Director of Neuro-Oncology [facility] [place] 09/29/20 9:21 AM

## 2020-09-30 ENCOUNTER — Ambulatory Visit
Admission: RE | Admit: 2020-09-30 | Discharge: 2020-09-30 | Disposition: A | Discharge status: Home / Self Care | Payer: No Typology Code available for payment source | Source: Ambulatory Visit | Attending: Radiation Oncology | Admitting: Radiation Oncology

## 2020-09-30 ENCOUNTER — Telehealth: Payer: Self-pay | Admitting: Internal Medicine

## 2020-09-30 ENCOUNTER — Encounter: Payer: Self-pay | Admitting: Radiation Oncology

## 2020-09-30 VITALS — BP 138/78 | HR 93 | Temp 97.8°F | Resp 18 | Ht 66.0 in | Wt 221.0 lb

## 2020-09-30 DIAGNOSIS — Z510 Encounter for antineoplastic radiation therapy: Secondary | ICD-10-CM | POA: Insufficient documentation

## 2020-09-30 DIAGNOSIS — C7931 Secondary malignant neoplasm of brain: Secondary | ICD-10-CM

## 2020-09-30 DIAGNOSIS — C541 Malignant neoplasm of endometrium: Secondary | ICD-10-CM | POA: Insufficient documentation

## 2020-09-30 DIAGNOSIS — R519 Headache, unspecified: Secondary | ICD-10-CM | POA: Insufficient documentation

## 2020-09-30 DIAGNOSIS — R918 Other nonspecific abnormal finding of lung field: Secondary | ICD-10-CM | POA: Insufficient documentation

## 2020-09-30 DIAGNOSIS — Z95828 Presence of other vascular implants and grafts: Secondary | ICD-10-CM

## 2020-09-30 DIAGNOSIS — Z79899 Other long term (current) drug therapy: Secondary | ICD-10-CM | POA: Insufficient documentation

## 2020-09-30 DIAGNOSIS — Z90722 Acquired absence of ovaries, bilateral: Secondary | ICD-10-CM | POA: Insufficient documentation

## 2020-09-30 DIAGNOSIS — Z90710 Acquired absence of both cervix and uterus: Secondary | ICD-10-CM | POA: Insufficient documentation

## 2020-09-30 DIAGNOSIS — Z923 Personal history of irradiation: Secondary | ICD-10-CM | POA: Insufficient documentation

## 2020-09-30 DIAGNOSIS — Z9221 Personal history of antineoplastic chemotherapy: Secondary | ICD-10-CM | POA: Insufficient documentation

## 2020-09-30 DIAGNOSIS — Z803 Family history of malignant neoplasm of breast: Secondary | ICD-10-CM | POA: Insufficient documentation

## 2020-09-30 DIAGNOSIS — Z801 Family history of malignant neoplasm of trachea, bronchus and lung: Secondary | ICD-10-CM | POA: Insufficient documentation

## 2020-09-30 DIAGNOSIS — Z7901 Long term (current) use of anticoagulants: Secondary | ICD-10-CM | POA: Insufficient documentation

## 2020-09-30 MED ORDER — HEPARIN SOD (PORK) LOCK FLUSH 100 UNIT/ML IV SOLN
500.0000 [IU] | Freq: Once | INTRAVENOUS | Status: AC
  Administered 2020-09-30: 500 [IU] via INTRAVENOUS

## 2020-09-30 MED ORDER — SODIUM CHLORIDE 0.9% FLUSH
10.0000 mL | Freq: Once | INTRAVENOUS | Status: AC
  Administered 2020-09-30: 10 mL via INTRAVENOUS

## 2020-09-30 NOTE — Telephone Encounter (Signed)
Scheduled per 11/22 los. Called and spoke with pt and sister, confirmed 12/6 appt

## 2020-09-30 NOTE — Progress Notes (Signed)
Has armband been applied?  Yes.    Does patient have an allergy to IV contrast dye?: No.   Has patient ever received premedication for IV contrast dye?: No.   Does patient take metformin?: No.  Date of lab work: September 19, 2020 BUN: 17 CR: 0.7  IV site: subclavian right, condition patent and no redness **Spoke with staff at [facility]. Copies of most recent Chest CT and PAC-placement note will be faxed over to be scanned in chart for reference   Has IV site been added to flowsheet?  Yes.    12:26 BP: 138/78 HR: 93 O2: 97% T: 97.8 (temporal) R: 18

## 2020-09-30 NOTE — Progress Notes (Signed)
Radiation Oncology         [phone] ________________________________  Initial Outpatient Consultation  Name: Brianna Herring MRN: [id number]  Date: 09/30/2020  DOB: 01/25/1956  WS:FKCLEX, [name], MD  Derwood Kaplan, MD   REFERRING PHYSICIAN: Derwood Kaplan, MD  DIAGNOSIS:     ICD-10-CM   1. Brain metastasis ([place])  C79.31     HISTORY OF PRESENT ILLNESS::Brianna Herring is a 64 y.o. female who was diagnosed with high-grade endometrial adenocarcinoma in 2017.  Grade 3 endometrioid endometrial adenocarcinoma with a 4cm tumor, 1.8 of 2.3cm (80%) myometrial invasion with LVSI present. She was treated with total hysterectomy and whole pelvis RT / vaginal brachytherapy (see below).  In April of 2019, the patient underwent CT imaging to re-evaluate pulmonary nodules, which revealed multiple pulmonary nodules that were consistent with recurrent disease. Thus, she underwent six cycles of chemotherapy with Carboplatin and Paclitaxel followed by Tamoxifen maintenance.  She has also received pembrolizumab and bevacizumab.  Her treatment has been given by Dr. Hinton Rao in [facility]  MRI of the brain on 09/26/2020 demonstrated a 2.7 cm right cerebellar mass that was consistent with metastasis. There was moderate vasogenic edema with partial effacement of the fourth ventricle but without evidence of obstructive hydrocephalus. Additionally, there was a 6 mm pituitary mass that may have been an incidental microadenoma or less likely metastasis.  She has been discussed at tumor board.  The consensus was that she should undergo a preoperative stereotactic radiosurgery followed by resection of the brain metastasis.  She is scheduled for craniotomy next week on 10/08/2020 by Dr. Duffy Rhody    Dose of Decadron, if applicable: 4 mg tablet daily (decreased from 4 mg BID)  Recent neurologic symptoms, if any:   Seizures: Patient denies  Headaches: Patient denies  currently (but did have headaches nausea and dizziness prior to diagnosis of metastasis)  Nausea: Patient denies, but does report a decrease in appetite  Dizziness/ataxia: Patient denies  Difficulty with hand coordination: Patient denies  Focal numbness/weakness: Patient denies  Visual deficits/changes: Patient denies  Confusion/Memory deficits: Reports difficulty with short and long term memory   Ambulatory status? Walker? Wheelchair?: Patient is reports she is able to ambulate unassisted   Pain on a scale of 0-10 is: 0 (patient denies any pain/discomfort)   SAFETY ISSUES:  Prior radiation? Yes (Dr. Jeneen Rinks Kinard)--03/02/2017-03/16/2017: Vaginal cuff treated to 18 Gy with 3 fractions of 6 Gy  Pacemaker/ICD? No  Possible current pregnancy? No--hysterectomy in 2018  Is the patient on methotrexate? No  Additional Complaints / other details: Nothing else of note   PREVIOUS RADIATION THERAPY: Yes (Dr. Sondra Come)  Indication for treatment:  Curative       Radiation treatment dates: 03/02/17 - 03/16/17 - The patient received 50.4 Gy of external beam radiation therapy at [facility] in [place] prior to her brachytherapy treatments  Site/dose: Vaginal cuff treated to 18 Gy with 3 fx of 6 Gy  Beams/energy:   HDR Ir-192 Vaginal // Iridium HDR, Iridium 192 wasthe high-dose-rate source. The patient wasbe treated with a 2 cm diameter cylinder with a treatment length of 3 cm. Prescription wasto the mucosal surface.  PAST MEDICAL HISTORY:  has a past medical history of Cancer [facility]), Headache, History of radiation therapy (03/02/17-03/16/17), Localized swelling of both lower legs, and Phlebitis alone (left leg  4-5 yrs ago).    PAST SURGICAL HISTORY: Past Surgical History:  Procedure Laterality Date  . ROBOTIC ASSISTED TOTAL HYSTERECTOMY WITH BILATERAL SALPINGO  OOPHERECTOMY N/A 11/18/2016   Procedure: XI ROBOTIC ASSISTED TOTAL HYSTERECTOMY WITH BILATERAL SALPINGO  OOPHORECTOMY;  Surgeon: Everitt Amber, MD;  Location: [facility] ORS;  Service: Gynecology;  Laterality: N/A;  . SENTINEL NODE BIOPSY N/A 11/18/2016   Procedure: SENTINEL NODE BIOPSY;  Surgeon: Everitt Amber, MD;  Location: [facility];  Service: Gynecology;  Laterality: N/A;    FAMILY HISTORY: family history includes Breast cancer in her sister; Lung cancer in her father.  SOCIAL HISTORY:  reports that she has never smoked. She has never used smokeless tobacco. She reports that she does not drink alcohol and does not use drugs.  ALLERGIES: Patient has no known allergies.  MEDICATIONS:  Current Outpatient Medications  Medication Sig Dispense Refill  . calcium carbonate (CALTRATE 600) 1500 (600 Ca) MG TABS tablet Take 1,500 tablets by mouth 2 (two) times daily.     Marland Kitchen dexamethasone (DECADRON) 4 MG tablet Take 1 tablet (4 mg total) by mouth 2 (two) times daily with a meal. (Patient taking differently: Take 4 mg by mouth daily. Changed from BID to daily by Dr. Mickeal Skinner on 09/29/20) 60 tablet 0  . Multiple Vitamins-Minerals (CENTRUM SILVER PO) Take 1 tablet by mouth daily.     . potassium chloride SA (KLOR-CON) 20 MEQ tablet Take 20 mEq by mouth daily.    . Probiotic Product (PROBIOTIC PO) Take 1 capsule by mouth daily.    . tamoxifen (NOLVADEX) 20 MG tablet Take 20 mg by mouth daily. (Patient not taking: Reported on 09/29/2020)  3  . XARELTO 20 MG TABS tablet Take 20 mg by mouth daily. (Patient not taking: Reported on 09/29/2020)     No current facility-administered medications for this encounter.    REVIEW OF SYSTEMS:  As above.   PHYSICAL EXAM:  height is 5\' 6"  (1.676 m) and weight is 221 lb (100.2 kg). Her temporal temperature is 97.8 F (36.6 C). Her blood pressure is 138/78 and her pulse is 93. Her respiration is 18 and oxygen saturation is 97%.   General: Alert  and in no acute distress - sister helps with providing the history HEENT: Head is normocephalic. Extraocular movements are intact. Oropharynx is  clear.  No thrush. Musculoskeletal: symmetric strength and muscle tone throughout. Neurologic: Cranial nerves II through XII are grossly intact.  No nystagmus.  No obvious focalities. Speech is fluent. Coordination is intact.  Object recall is 3 out of 3 at 3 minutes.  Gait is independent and grossly steady. Psychiatric: Judgment and insight are intact. Affect is appropriate.  KPS = 80  100 - Normal; no complaints; no evidence of disease. 90   - Able to carry on normal activity; minor signs or symptoms of disease. 80   - Normal activity with effort; some signs or symptoms of disease. 33   - Cares for self; unable to carry on normal activity or to do active work. 60   - Requires occasional assistance, but is able to care for most of his personal needs. 50   - Requires considerable assistance and frequent medical care. 28   - Disabled; requires special care and assistance. 10   - Severely disabled; hospital admission is indicated although death not imminent. 10   - Very sick; hospital admission necessary; active supportive treatment necessary. 10   - Moribund; fatal processes progressing rapidly. 0     - Dead  Karnofsky DA, Abelmann WH, Craver LS and Burchenal [facility] [phone]) The use of the nitrogen mustards in the palliative treatment of carcinoma: with  particular reference to bronchogenic carcinoma Cancer 1 634-56   LABORATORY DATA:  Lab Results  Component Value Date   WBC 6.6 09/19/2020   HGB 13.1 09/19/2020   HCT 41 09/19/2020   MCV 108.8 (H) 08/13/2018   PLT 231 09/19/2020   CMP     Component Value Date/Time   NA 139 09/19/2020 0000   NA 139 11/03/2016 1023   K 3.9 09/19/2020 0000   K 3.8 11/03/2016 1023   CL 98 (A) 09/19/2020 0000   CO2 28 (A) 09/19/2020 0000   CO2 26 11/03/2016 1023   GLUCOSE 126 (H) 08/13/2018 0236   GLUCOSE 134 11/03/2016 1023   BUN 17 09/19/2020 0000   BUN 11.6 11/03/2016 1023   CREATININE 0.7 09/19/2020 0000   CREATININE 0.71 08/13/2018 0236    CREATININE 0.8 11/03/2016 1023   CALCIUM 10.1 09/19/2020 0000   CALCIUM 9.6 11/03/2016 1023   PROT 7.8 11/03/2016 1023   ALBUMIN 4.7 09/19/2020 0000   ALBUMIN 3.5 11/03/2016 1023   AST 38 (A) 09/19/2020 0000   AST 24 11/03/2016 1023   ALT 22 09/19/2020 0000   ALT 17 11/03/2016 1023   ALKPHOS 72 09/19/2020 0000   ALKPHOS 78 11/03/2016 1023   BILITOT 0.62 11/03/2016 1023   GFRNONAA >60 08/13/2018 0236   GFRAA >60 08/13/2018 0236         RADIOGRAPHY: MR Brain W Wo Contrast  Result Date: 09/26/2020 CLINICAL DATA:  Recurrent high-grade endometrial cancer. EXAM: MRI HEAD WITHOUT AND WITH CONTRAST TECHNIQUE: Multiplanar, multiecho pulse sequences of the brain and surrounding structures were obtained without and with intravenous contrast. CONTRAST:  56mL GADAVIST GADOBUTROL 1 MMOL/ML IV SOLN COMPARISON:  Head CT 09/19/2020 FINDINGS: Brain: A partially necrotic or cystic right cerebellar mass with irregular nodular peripheral enhancement measures 2.8 x 2.7 cm. There is moderate surrounding vasogenic edema with partial effacement of the fourth ventricle but no evidence of obstructive hydrocephalus. No acute infarct, intracranial hemorrhage, midline shift, or extra-axial fluid collection is identified. There is mild cerebral atrophy. Small T2 hyperintensities scattered throughout the subcortical and deep cerebral white matter bilaterally are nonspecific but compatible with mild-to-moderate chronic small vessel ischemic disease. 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 There is asymmetric enlargement of the pituitary gland on the right with evidence of an underlying 6 mm enhancing mass. Vascular: Major intracranial vascular flow voids are preserved. Skull and upper cervical spine: Unremarkable bone marrow signal. Sinuses/Orbits: Unremarkable orbits. Paranasal sinuses and mastoid air cells are clear. Other: None. IMPRESSION: 1. 2.7 cm right cerebellar mass  consistent with a metastasis. Moderate vasogenic edema with partial effacement of the fourth ventricle but no evidence of obstructive hydrocephalus. 2. 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 Attention on follow-up. 3. Mild-to-moderate chronic small vessel ischemic disease. Electronically Signed   By: Logan Bores M.D.   On: 09/26/2020 17:35      IMPRESSION/PLAN: This is a very pleasant 64 year old female with metastatic disease to the brain (single cerebellar metastasis).  I had a lengthy discussion with the patient after reviewing their MRI results with them.  We spoke about whole brain radiotherapy versus stereotactic radiosurgery to the brain. We spoke about the differing risks benefits and side effects of both of these treatments.  Given that she only has 1 metastasis, I do not recommend whole brain radiation - I believe this is overly comprehensive and would carry an  increased risk of side effects including diffuse scalp hair loss, significant fatigue, cognitive decline.  The consensus of the tumor board, which I agree with, is that would we treat the single lesion focally with preoperative radiosurgery followed by craniotomy.  We spoke about radionecrosis that can result from stereotactic radiosurgery and the potential for some fatigue and/or patchy hair loss. As stereotactic radiosurgery only treats the areas of gross disease while sparing the rest of the brain parenchyma, it will be important that she undergoes serial MRIs so that we can treat other lesions if they develop in other parts of the brain in the future.  After lengthy discussion, the patient would like to proceed with stereotactic brain radiosurgery to their metastatic disease before craniotomy. CT simulation will take place on today and treatment next week before her craniotomy.  I plan to deliver 16 Gy in 1 fraction to the right cerebellar metastasis.  On date of service, in total,  I spent 48 minutes on this encounter. Patient was seen in person.  __________________________________________   Eppie Gibson, MD  This document serves as a record of services personally performed by Eppie Gibson, MD. It was created on his behalf by Clerance Lav, a trained medical scribe. The creation of this record is based on the scribe's personal observations and the provider's statements to them. This document has been checked and approved by the attending provider.

## 2020-10-01 ENCOUNTER — Encounter: Payer: Self-pay | Admitting: Radiation Oncology

## 2020-10-01 DIAGNOSIS — Z510 Encounter for antineoplastic radiation therapy: Secondary | ICD-10-CM | POA: Diagnosis not present

## 2020-10-03 NOTE — Progress Notes (Signed)
[place], [place] - [place] [address] [place] [address] Phone: [phone] Fax: [phone]      Your procedure is scheduled on 10/08/20.  Report to [facility] Main Entrance "A" at 11:00 A.M., and check in at the Admitting office.  Call this number if you have problems the morning of surgery:  [phone]  Call [phone] if you have any questions prior to your surgery date Monday-Friday 8am-4pm    Remember:  Do not eat or drink after midnight the night before your surgery     Take these medicines the morning of surgery with A SIP OF WATER  dexamethasone (DECADRON)  As of today, STOP taking any Aspirin (unless otherwise instructed by your surgeon) Aleve, Naproxen, Ibuprofen, Motrin, Advil, Goody's, BC's, all herbal medications, fish oil, and all vitamins.                      Do not wear jewelry, make up, or nail polish            Do not wear lotions, powders, perfumes/colognes, or deodorant.            Do not shave 48 hours prior to surgery.              Do not bring valuables to the hospital.            [facility] is not responsible for any belongings or valuables.  Do NOT Smoke (Tobacco/Vaping) or drink Alcohol 24 hours prior to your procedure If you use a CPAP at night, you may bring all equipment for your overnight stay.   Contacts, glasses, dentures or bridgework may not be worn into surgery.      For patients admitted to the hospital, discharge time will be determined by your treatment team.   Patients discharged the day of surgery will not be allowed to drive home, and someone needs to stay with them for 24 hours.    Special instructions:   Brianna Herring- Preparing For Surgery  Before surgery, you can play an important role. Because skin is not sterile, your skin needs to be as free of germs as possible. You can reduce the number of germs on your skin by washing with CHG (chlorahexidine gluconate) Soap before  surgery.  CHG is an antiseptic cleaner which kills germs and bonds with the skin to continue killing germs even after washing.    Oral Hygiene is also important to reduce your risk of infection.  Remember - BRUSH YOUR TEETH THE MORNING OF SURGERY WITH YOUR REGULAR TOOTHPASTE  Please do not use if you have an allergy to CHG or antibacterial soaps. If your skin becomes reddened/irritated stop using the CHG.  Do not shave (including legs and underarms) for at least 48 hours prior to first CHG shower. It is OK to shave your face.  Please follow these instructions carefully.   1. Shower the NIGHT BEFORE SURGERY and the MORNING OF SURGERY with CHG Soap.   2. If you chose to wash your hair, wash your hair first as usual with your normal shampoo.  3. After you shampoo, rinse your hair and body thoroughly to remove the shampoo.  4. Use CHG as you would any other liquid soap. You can apply CHG directly to the skin and wash gently with a scrungie or a clean washcloth.   5. Apply the CHG Soap to your body ONLY FROM THE NECK DOWN.  Do  not use on open wounds or open sores. Avoid contact with your eyes, ears, mouth and genitals (private parts). Wash Face and genitals (private parts)  with your normal soap.   6. Wash thoroughly, paying special attention to the area where your surgery will be performed.  7. Thoroughly rinse your body with warm water from the neck down.  8. DO NOT shower/wash with your normal soap after using and rinsing off the CHG Soap.  9. Pat yourself dry with a CLEAN TOWEL.  10. Wear CLEAN PAJAMAS to bed the night before surgery  11. Place CLEAN SHEETS on your bed the night of your first shower and DO NOT SLEEP WITH PETS.   Day of Surgery: Wear Clean/Comfortable clothing the morning of surgery Do not apply any deodorants/lotions.   Remember to brush your teeth WITH YOUR REGULAR TOOTHPASTE.   Please read over the following fact sheets that you were given.

## 2020-10-06 ENCOUNTER — Encounter (HOSPITAL_COMMUNITY): Payer: Self-pay

## 2020-10-06 ENCOUNTER — Encounter: Payer: Self-pay | Admitting: Radiation Oncology

## 2020-10-06 ENCOUNTER — Encounter (HOSPITAL_COMMUNITY)
Admission: RE | Admit: 2020-10-06 | Discharge: 2020-10-06 | Disposition: A | Discharge status: Home / Self Care | Payer: No Typology Code available for payment source | Source: Ambulatory Visit | Attending: Neurosurgery | Admitting: Neurosurgery

## 2020-10-06 ENCOUNTER — Ambulatory Visit
Admission: RE | Admit: 2020-10-06 | Discharge: 2020-10-06 | Disposition: A | Discharge status: Home / Self Care | Payer: No Typology Code available for payment source | Source: Ambulatory Visit | Attending: Radiation Oncology | Admitting: Radiation Oncology

## 2020-10-06 ENCOUNTER — Ambulatory Visit: Payer: PRIVATE HEALTH INSURANCE | Admitting: Internal Medicine

## 2020-10-06 ENCOUNTER — Other Ambulatory Visit: Payer: Self-pay

## 2020-10-06 ENCOUNTER — Other Ambulatory Visit (HOSPITAL_COMMUNITY)
Admission: RE | Admit: 2020-10-06 | Discharge: 2020-10-06 | Disposition: A | Discharge status: Home / Self Care | Payer: No Typology Code available for payment source | Source: Ambulatory Visit | Attending: Neurosurgery | Admitting: Neurosurgery

## 2020-10-06 VITALS — BP 129/65 | HR 90 | Temp 98.0°F | Resp 20

## 2020-10-06 DIAGNOSIS — Z01812 Encounter for preprocedural laboratory examination: Secondary | ICD-10-CM | POA: Insufficient documentation

## 2020-10-06 DIAGNOSIS — C7931 Secondary malignant neoplasm of brain: Secondary | ICD-10-CM

## 2020-10-06 DIAGNOSIS — Z20822 Contact with and (suspected) exposure to covid-19: Secondary | ICD-10-CM | POA: Insufficient documentation

## 2020-10-06 DIAGNOSIS — Z90710 Acquired absence of both cervix and uterus: Secondary | ICD-10-CM | POA: Diagnosis not present

## 2020-10-06 DIAGNOSIS — Z923 Personal history of irradiation: Secondary | ICD-10-CM | POA: Diagnosis not present

## 2020-10-06 DIAGNOSIS — Z01818 Encounter for other preprocedural examination: Secondary | ICD-10-CM | POA: Insufficient documentation

## 2020-10-06 DIAGNOSIS — C541 Malignant neoplasm of endometrium: Secondary | ICD-10-CM | POA: Diagnosis not present

## 2020-10-06 DIAGNOSIS — Z510 Encounter for antineoplastic radiation therapy: Secondary | ICD-10-CM | POA: Diagnosis present

## 2020-10-06 DIAGNOSIS — Z9221 Personal history of antineoplastic chemotherapy: Secondary | ICD-10-CM | POA: Diagnosis not present

## 2020-10-06 LAB — TYPE AND SCREEN
ABO/RH(D): O POS
Antibody Screen: NEGATIVE

## 2020-10-06 LAB — CBC
HCT: 41.2 % (ref 36.0–46.0)
Hemoglobin: 13.1 g/dL (ref 12.0–15.0)
MCH: 28.5 pg (ref 26.0–34.0)
MCHC: 31.8 g/dL (ref 30.0–36.0)
MCV: 89.6 fL (ref 80.0–100.0)
Platelets: 195 10*3/uL (ref 150–400)
RBC: 4.6 MIL/uL (ref 3.87–5.11)
RDW: 15.5 % (ref 11.5–15.5)
WBC: 9.2 10*3/uL (ref 4.0–10.5)
nRBC: 0 % (ref 0.0–0.2)

## 2020-10-06 LAB — SARS CORONAVIRUS 2 (TAT 6-24 HRS): SARS Coronavirus 2: NEGATIVE

## 2020-10-06 NOTE — Op Note (Signed)
Name: Eisha Chatterjee    MRN: [id number]   Date: 10/06/2020    DOB: 1955/12/26   STEREOTACTIC RADIOSURGERY OPERATIVE NOTE  PRE-OPERATIVE DIAGNOSIS:  Cerebellar brain metastasis  POST-OPERATIVE DIAGNOSIS:  Cerebellar brain metastasis  PROCEDURE:  Preoperative Stereotactic Radiosurgery using TrueBeam Linac device  SURGEON:  Duffy Rhody, MD  RADIATION ONCOLOGIST: Eppie Gibson, MD  TECHNIQUE:  The patient underwent a radiation treatment planning session in the radiation oncology simulation suite under the care of the radiation oncology physician and physicist.  I participated closely in the radiation treatment planning afterwards. The patient underwent planning CT which was fused to 3T high resolution MRI with 1 mm axial slices.  These images were fused on the planning system.  We contoured the gross target volumes and subsequently expanded this by 1 mm to yield the Planning Target Volume. I actively participated in the planning process.  I helped to define and review the target contours and also the contours of the optic pathway, cochlea, eyes, brainstem and selected nearby organs at risk.  All the dose constraints for critical structures were reviewed and compared to AAPM Task Group 101.  The prescription dose conformity was reviewed.  I approved the plan electronically.    Accordingly, [name]  was brought to the TrueBeam stereotactic radiation treatment linac and placed in the custom immobilization mask.  The patient was aligned according to the IR fiducial markers with BrainLab Exactrac, then orthogonal x-rays were used in ExacTrac with the 6DOF robotic table and the shifts were made to align the patient  Tanika Bracco received stereotactic radiosurgery to a prescription dose of 16 Gy uneventfully.  The detailed description of the procedure is recorded in the radiation oncology procedure note.  I was present for the duration of the  procedure.  DISPOSITION:   Following delivery, the patient was transported to nursing in stable condition and monitored for possible acute effects to be discharged to home in stable condition with plan for surgical resection 10/08/2020.  Duffy Rhody, MD [facility]

## 2020-10-06 NOTE — Progress Notes (Signed)
Patient rested with [place] for 30 minutes following single SRS treatment. Patient denied headache, dizziness, nausea, diplopia or ringing in the ears. Updated patient's sister as well. Both verbalized understanding that patient should avoid strenuous activity for the next 24 hours, that she will be more fatigued for the next 24-48 hours, and to call [phone] with needs after hours. Both patient and sister verbalized understanding and agreement. Patient ambulated out of clinic unassisted, and was driven home by sister   Today's Vitals   10/06/20 1325  BP: 129/65  Pulse: 90  Resp: 20  Temp: 98 F (36.7 C)  SpO2: 99%

## 2020-10-06 NOTE — Progress Notes (Signed)
PCP - Dr Ledell Noss Cardiologist - n/a  Chest x-ray - n/a. CT chest 04/23/20 EKG - n/a Stress Test - n/a ECHO - n/a Cardiac Cath - n/a  Coronavirus Screening Covid test scheduled on 10/06/20. Do you have any of the following symptoms:  Cough yes/no: No Fever (>100.40F)  yes/no: No Runny nose yes/no: No Sore throat yes/no: No Difficulty breathing/shortness of breath  yes/no: No  Have you traveled in the last 14 days and where? yes/no: No  Patient verbalized understanding of instructions that were given to them at the PAT appointment.

## 2020-10-07 ENCOUNTER — Other Ambulatory Visit: Payer: Self-pay | Admitting: Hematology and Oncology

## 2020-10-07 DIAGNOSIS — C541 Malignant neoplasm of endometrium: Secondary | ICD-10-CM

## 2020-10-07 NOTE — Anesthesia Preprocedure Evaluation (Addendum)
Anesthesia Evaluation  Patient identified by MRN, date of birth, ID band Patient awake    Reviewed: Allergy & Precautions, NPO status , Patient's Chart, lab work & pertinent test results  Airway Mallampati: II  TM Distance: >3 FB Neck ROM: Full    Dental  (+) Missing, Dental Advidsory Given   Pulmonary neg pulmonary ROS,    Pulmonary exam normal breath sounds clear to auscultation       Cardiovascular Exercise Tolerance: Good negative cardio ROS Normal cardiovascular exam Rhythm:Regular Rate:Normal     Neuro/Psych  Headaches, Cerebellar brain metastasis negative psych ROS   GI/Hepatic negative GI ROS, Neg liver ROS,   Endo/Other  Obesity   Renal/GU negative Renal ROS     Musculoskeletal negative musculoskeletal ROS (+)   Abdominal   Peds  Hematology negative hematology ROS (+)   Anesthesia Other Findings   Reproductive/Obstetrics                          Anesthesia Physical Anesthesia Plan  ASA: IV  Anesthesia Plan: General   Post-op Pain Management:    Induction: Intravenous  PONV Risk Score and Plan: 3 and Midazolam, Dexamethasone and Ondansetron  Airway Management Planned: Oral ETT  Additional Equipment: Arterial line  Intra-op Plan:   Post-operative Plan: Possible Post-op intubation/ventilation  Informed Consent: I have reviewed the patients History and Physical, chart, labs and discussed the procedure including the risks, benefits and alternatives for the proposed anesthesia with the patient or authorized representative who has indicated his/her understanding and acceptance.       Plan Discussed with: CRNA  Anesthesia Plan Comments: (2 large bore PIV vs CVL)       Anesthesia Quick Evaluation

## 2020-10-07 NOTE — Progress Notes (Signed)
  Radiation Oncology         [phone] ________________________________  Name: Brianna Herring MRN: [id number]  Date: 10/06/2020  DOB: 04-13-1956  Stereotactic Treatment Procedure Note  SPECIAL TREATMENT PROCEDURE  Outpatient    ICD-10-CM   1. Brain metastasis ([place])  C79.31     3D TREATMENT PLANNING AND DOSIMETRY:  The patient's radiation plan was reviewed and approved by neurosurgery and radiation oncology prior to treatment.  It showed 3-dimensional radiation distributions overlaid onto the planning CT/MRI image set.  [facility] for the target structures as well as the organs at risk were reviewed. The documentation of the 3D plan and dosimetry are filed in the radiation oncology EMR.  NARRATIVE:  Brianna Herring was brought to the TrueBeam stereotactic radiation treatment machine and placed supine on the CT couch. The head frame was applied, and the patient was set up for stereotactic radiosurgery.  Neurosurgery was present for the set-up and delivery  SIMULATION VERIFICATION:  In the couch zero-angle position, the patient underwent Exactrac imaging using the Brainlab system with orthogonal KV images.  These were carefully aligned and repeated to confirm treatment position for each of the isocenters.  The Exactrac snap film verification was repeated at each couch angle.  SPECIAL TREATMENT PROCEDURE: [name] received stereotactic radiosurgery to the following targets:  PTV1 Rt Cerebellum 59mm 16Gy 6FFF photons ExacTrac used for each beam angle, 4 VMAT beams Max dose=134.4%  This constitutes a special treatment procedure due to the ablative dose delivered and the technical nature of treatment.  This highly technical modality of treatment ensures that the ablative dose is centered on the patient's tumor while sparing normal tissues from excessive dose and risk of detrimental effects.  STEREOTACTIC TREATMENT MANAGEMENT:  Following delivery, the  patient was transported to nursing in stable condition and monitored for possible acute effects.  Vital signs were recorded BP 129/65 (BP Location: Right Arm, Patient Position: Sitting, Cuff Size: Large)   Pulse 90   Temp 98 F (36.7 C)   Resp 20   LMP  (LMP Unknown)   SpO2 99% . The patient tolerated treatment without significant acute effects, and was discharged to home in stable condition.    PLAN: Surgery to follow. Follow-up in one month.  ________________________________   Eppie Gibson, MD

## 2020-10-08 ENCOUNTER — Encounter (HOSPITAL_COMMUNITY): Payer: Self-pay

## 2020-10-08 ENCOUNTER — Inpatient Hospital Stay (HOSPITAL_COMMUNITY)
Admission: RE | Admit: 2020-10-08 | Discharge: 2020-10-10 | DRG: 026 | Disposition: A | Discharge status: Home / Self Care | Payer: No Typology Code available for payment source | Attending: Neurosurgery | Admitting: Neurosurgery

## 2020-10-08 ENCOUNTER — Other Ambulatory Visit: Payer: Self-pay

## 2020-10-08 ENCOUNTER — Inpatient Hospital Stay (HOSPITAL_COMMUNITY): Payer: No Typology Code available for payment source | Admitting: Certified Registered Nurse Anesthetist

## 2020-10-08 ENCOUNTER — Encounter (HOSPITAL_COMMUNITY)
Admission: RE | Disposition: A | Discharge status: Home / Self Care | Payer: Self-pay | Source: Home / Self Care | Attending: Neurosurgery

## 2020-10-08 DIAGNOSIS — C7800 Secondary malignant neoplasm of unspecified lung: Secondary | ICD-10-CM | POA: Diagnosis present

## 2020-10-08 DIAGNOSIS — R278 Other lack of coordination: Secondary | ICD-10-CM | POA: Diagnosis present

## 2020-10-08 DIAGNOSIS — Z801 Family history of malignant neoplasm of trachea, bronchus and lung: Secondary | ICD-10-CM | POA: Diagnosis not present

## 2020-10-08 DIAGNOSIS — Z923 Personal history of irradiation: Secondary | ICD-10-CM

## 2020-10-08 DIAGNOSIS — Z20822 Contact with and (suspected) exposure to covid-19: Secondary | ICD-10-CM | POA: Diagnosis present

## 2020-10-08 DIAGNOSIS — Z7952 Long term (current) use of systemic steroids: Secondary | ICD-10-CM

## 2020-10-08 DIAGNOSIS — Z803 Family history of malignant neoplasm of breast: Secondary | ICD-10-CM | POA: Diagnosis not present

## 2020-10-08 DIAGNOSIS — C7931 Secondary malignant neoplasm of brain: Secondary | ICD-10-CM | POA: Diagnosis present

## 2020-10-08 DIAGNOSIS — Z79899 Other long term (current) drug therapy: Secondary | ICD-10-CM | POA: Diagnosis not present

## 2020-10-08 DIAGNOSIS — Z8542 Personal history of malignant neoplasm of other parts of uterus: Secondary | ICD-10-CM | POA: Diagnosis not present

## 2020-10-08 HISTORY — PX: CRANIOTOMY: SHX93

## 2020-10-08 HISTORY — PX: APPLICATION OF CRANIAL NAVIGATION: SHX6578

## 2020-10-08 LAB — POCT I-STAT 7, (LYTES, BLD GAS, ICA,H+H)
Acid-Base Excess: 2 mmol/L (ref 0.0–2.0)
Acid-Base Excess: 1 mmol/L (ref 0.0–2.0)
Bicarbonate: 26 mmol/L (ref 20.0–28.0)
Bicarbonate: 24.4 mmol/L (ref 20.0–28.0)
Calcium, Ion: 1.16 mmol/L (ref 1.15–1.40)
Calcium, Ion: 1.12 mmol/L — ABNORMAL LOW (ref 1.15–1.40)
HCT: 35 % — ABNORMAL LOW (ref 36.0–46.0)
HCT: 34 % — ABNORMAL LOW (ref 36.0–46.0)
Hemoglobin: 11.9 g/dL — ABNORMAL LOW (ref 12.0–15.0)
Hemoglobin: 11.6 g/dL — ABNORMAL LOW (ref 12.0–15.0)
O2 Saturation: 100 %
O2 Saturation: 100 %
Potassium: 3.3 mmol/L — ABNORMAL LOW (ref 3.5–5.1)
Potassium: 3.4 mmol/L — ABNORMAL LOW (ref 3.5–5.1)
Sodium: 137 mmol/L (ref 135–145)
Sodium: 134 mmol/L — ABNORMAL LOW (ref 135–145)
TCO2: 27 mmol/L (ref 22–32)
TCO2: 25 mmol/L (ref 22–32)
pCO2 arterial: 38.2 mmHg (ref 32.0–48.0)
pCO2 arterial: 32.1 mmHg (ref 32.0–48.0)
pH, Arterial: 7.442 (ref 7.350–7.450)
pH, Arterial: 7.489 — ABNORMAL HIGH (ref 7.350–7.450)
pO2, Arterial: 273 mmHg — ABNORMAL HIGH (ref 83.0–108.0)
pO2, Arterial: 253 mmHg — ABNORMAL HIGH (ref 83.0–108.0)

## 2020-10-08 LAB — POCT I-STAT, CHEM 8
BUN: 21 mg/dL (ref 8–23)
Calcium, Ion: 1.11 mmol/L — ABNORMAL LOW (ref 1.15–1.40)
Chloride: 98 mmol/L (ref 98–111)
Creatinine, Ser: 0.8 mg/dL (ref 0.44–1.00)
Glucose, Bld: 113 mg/dL — ABNORMAL HIGH (ref 70–99)
HCT: 42 % (ref 36.0–46.0)
Hemoglobin: 14.3 g/dL (ref 12.0–15.0)
Potassium: 3.3 mmol/L — ABNORMAL LOW (ref 3.5–5.1)
Sodium: 137 mmol/L (ref 135–145)
TCO2: 27 mmol/L (ref 22–32)

## 2020-10-08 LAB — MRSA PCR SCREENING: MRSA by PCR: NEGATIVE

## 2020-10-08 SURGERY — CRANIOTOMY TUMOR EXCISION
Anesthesia: General | Site: Head

## 2020-10-08 MED ORDER — PHENYLEPHRINE HCL-NACL 10-0.9 MG/250ML-% IV SOLN
INTRAVENOUS | Status: DC | PRN
  Administered 2020-10-08: 30 ug/min via INTRAVENOUS

## 2020-10-08 MED ORDER — LABETALOL HCL 5 MG/ML IV SOLN
INTRAVENOUS | Status: DC | PRN
  Administered 2020-10-08 (×3): 5 mg via INTRAVENOUS

## 2020-10-08 MED ORDER — PROPOFOL 10 MG/ML IV BOLUS
INTRAVENOUS | Status: DC | PRN
  Administered 2020-10-08: 170 mg via INTRAVENOUS
  Administered 2020-10-08: 30 mg via INTRAVENOUS
  Administered 2020-10-08: 70 mg via INTRAVENOUS
  Administered 2020-10-08: 30 mg via INTRAVENOUS

## 2020-10-08 MED ORDER — BACITRACIN ZINC 500 UNIT/GM EX OINT
TOPICAL_OINTMENT | CUTANEOUS | Status: AC
  Filled 2020-10-08: qty 28.35

## 2020-10-08 MED ORDER — FENTANYL CITRATE (PF) 100 MCG/2ML IJ SOLN
INTRAMUSCULAR | Status: AC
  Filled 2020-10-08: qty 2

## 2020-10-08 MED ORDER — POLYETHYLENE GLYCOL 3350 17 G PO PACK: 17.0000 g | PACK | Freq: Every day | ORAL | Status: DC | PRN

## 2020-10-08 MED ORDER — HYDRALAZINE HCL 20 MG/ML IJ SOLN
5.0000 mg | INTRAMUSCULAR | Status: AC | PRN
  Administered 2020-10-08 (×2): 5 mg via INTRAVENOUS

## 2020-10-08 MED ORDER — ACETAMINOPHEN 325 MG PO TABS
650.0000 mg | ORAL_TABLET | ORAL | Status: DC | PRN
  Administered 2020-10-09 – 2020-10-10 (×3): 650 mg via ORAL
  Filled 2020-10-08 (×3): qty 2

## 2020-10-08 MED ORDER — PROPOFOL 10 MG/ML IV BOLUS
INTRAVENOUS | Status: AC
  Filled 2020-10-08: qty 20

## 2020-10-08 MED ORDER — LEVETIRACETAM 500 MG PO TABS
500.0000 mg | ORAL_TABLET | Freq: Two times a day (BID) | ORAL | Status: DC
  Administered 2020-10-08 – 2020-10-10 (×4): 500 mg via ORAL
  Filled 2020-10-08 (×4): qty 1

## 2020-10-08 MED ORDER — ACETAMINOPHEN 500 MG PO TABS
1000.0000 mg | ORAL_TABLET | Freq: Once | ORAL | Status: AC
  Administered 2020-10-08: 1000 mg via ORAL
  Filled 2020-10-08: qty 2

## 2020-10-08 MED ORDER — PANTOPRAZOLE SODIUM 40 MG PO TBEC
40.0000 mg | DELAYED_RELEASE_TABLET | Freq: Every day | ORAL | Status: DC
  Administered 2020-10-09 – 2020-10-10 (×2): 40 mg via ORAL
  Filled 2020-10-08 (×3): qty 1

## 2020-10-08 MED ORDER — FENTANYL CITRATE (PF) 250 MCG/5ML IJ SOLN
INTRAMUSCULAR | Status: DC | PRN
Start: 2020-10-08 — End: 2020-10-08
  Administered 2020-10-08: 150 ug via INTRAVENOUS
  Administered 2020-10-08: 100 ug via INTRAVENOUS
  Administered 2020-10-08 (×2): 50 ug via INTRAVENOUS

## 2020-10-08 MED ORDER — PROMETHAZINE HCL 25 MG PO TABS: 12.5000 mg | ORAL_TABLET | ORAL | Status: DC | PRN

## 2020-10-08 MED ORDER — 0.9 % SODIUM CHLORIDE (POUR BTL) OPTIME
TOPICAL | Status: DC | PRN
  Administered 2020-10-08 (×3): 1000 mL

## 2020-10-08 MED ORDER — LABETALOL HCL 5 MG/ML IV SOLN
INTRAVENOUS | Status: AC
  Filled 2020-10-08: qty 4

## 2020-10-08 MED ORDER — ENALAPRILAT 1.25 MG/ML IV SOLN
1.2500 mg | Freq: Four times a day (QID) | INTRAVENOUS | Status: DC | PRN
  Administered 2020-10-08: 1.25 mg via INTRAVENOUS
  Filled 2020-10-08 (×2): qty 1

## 2020-10-08 MED ORDER — MORPHINE SULFATE (PF) 2 MG/ML IV SOLN
1.0000 mg | INTRAVENOUS | Status: DC | PRN
  Administered 2020-10-08 (×2): 2 mg via INTRAVENOUS
  Filled 2020-10-08 (×2): qty 1

## 2020-10-08 MED ORDER — CEFAZOLIN SODIUM-DEXTROSE 2-4 GM/100ML-% IV SOLN
2.0000 g | Freq: Once | INTRAVENOUS | Status: AC
  Administered 2020-10-08 (×2): 2 g via INTRAVENOUS

## 2020-10-08 MED ORDER — SUGAMMADEX SODIUM 200 MG/2ML IV SOLN
INTRAVENOUS | Status: DC | PRN
  Administered 2020-10-08: 200 mg via INTRAVENOUS

## 2020-10-08 MED ORDER — NITROGLYCERIN IN D5W 200-5 MCG/ML-% IV SOLN
INTRAVENOUS | Status: AC
  Filled 2020-10-08: qty 250

## 2020-10-08 MED ORDER — THROMBIN 20000 UNITS EX SOLR
CUTANEOUS | Status: DC | PRN
  Administered 2020-10-08: 20 mL via TOPICAL

## 2020-10-08 MED ORDER — ESMOLOL HCL 100 MG/10ML IV SOLN
INTRAVENOUS | Status: AC
  Filled 2020-10-08: qty 20

## 2020-10-08 MED ORDER — CEFAZOLIN SODIUM 1 G IJ SOLR
INTRAMUSCULAR | Status: AC
  Filled 2020-10-08: qty 20

## 2020-10-08 MED ORDER — ONDANSETRON HCL 4 MG PO TABS: 4.0000 mg | ORAL_TABLET | ORAL | Status: DC | PRN

## 2020-10-08 MED ORDER — DEXAMETHASONE SODIUM PHOSPHATE 10 MG/ML IJ SOLN
INTRAMUSCULAR | Status: AC
  Filled 2020-10-08: qty 1

## 2020-10-08 MED ORDER — POTASSIUM CHLORIDE IN NACL 20-0.9 MEQ/L-% IV SOLN
INTRAVENOUS | Status: DC
  Filled 2020-10-08 (×2): qty 1000

## 2020-10-08 MED ORDER — FENTANYL CITRATE (PF) 250 MCG/5ML IJ SOLN
INTRAMUSCULAR | Status: AC
  Filled 2020-10-08: qty 5

## 2020-10-08 MED ORDER — CALCIUM CARBONATE 1250 (500 CA) MG PO TABS
1.0000 | ORAL_TABLET | Freq: Two times a day (BID) | ORAL | Status: DC
  Administered 2020-10-09 – 2020-10-10 (×3): 500 mg via ORAL
  Filled 2020-10-08 (×6): qty 1

## 2020-10-08 MED ORDER — CHLORHEXIDINE GLUCONATE 0.12 % MT SOLN
OROMUCOSAL | Status: AC
  Administered 2020-10-08: 15 mL via OROMUCOSAL
  Filled 2020-10-08: qty 15

## 2020-10-08 MED ORDER — CHLORHEXIDINE GLUCONATE CLOTH 2 % EX PADS
6.0000 | MEDICATED_PAD | Freq: Every day | CUTANEOUS | Status: DC
  Administered 2020-10-08 – 2020-10-10 (×2): 6 via TOPICAL

## 2020-10-08 MED ORDER — ORAL CARE MOUTH RINSE: 15.0000 mL | Freq: Once | OROMUCOSAL | Status: AC

## 2020-10-08 MED ORDER — DEXAMETHASONE SODIUM PHOSPHATE 10 MG/ML IJ SOLN
INTRAMUSCULAR | Status: DC | PRN
  Administered 2020-10-08: 10 mg via INTRAVENOUS

## 2020-10-08 MED ORDER — THROMBIN 20000 UNITS EX SOLR
CUTANEOUS | Status: AC
  Filled 2020-10-08: qty 20000

## 2020-10-08 MED ORDER — PNEUMOCOCCAL VAC POLYVALENT 25 MCG/0.5ML IJ INJ: 0.5000 mL | INJECTION | INTRAMUSCULAR | Status: DC | PRN

## 2020-10-08 MED ORDER — LIDOCAINE-EPINEPHRINE 1 %-1:100000 IJ SOLN
INTRAMUSCULAR | Status: AC
  Filled 2020-10-08: qty 1

## 2020-10-08 MED ORDER — LACTATED RINGERS IV SOLN: INTRAVENOUS | Status: DC

## 2020-10-08 MED ORDER — MANNITOL 25 % IV SOLN
INTRAVENOUS | Status: DC | PRN
  Administered 2020-10-08: 50 g via INTRAVENOUS

## 2020-10-08 MED ORDER — MICROFIBRILLAR COLL HEMOSTAT EX PADS
MEDICATED_PAD | CUTANEOUS | Status: DC | PRN
  Administered 2020-10-08: 1 via TOPICAL

## 2020-10-08 MED ORDER — LIDOCAINE HCL (PF) 2 % IJ SOLN
INTRAMUSCULAR | Status: AC
  Filled 2020-10-08: qty 5

## 2020-10-08 MED ORDER — LIDOCAINE-EPINEPHRINE 1 %-1:100000 IJ SOLN
INTRAMUSCULAR | Status: DC | PRN
  Administered 2020-10-08: 7 mL

## 2020-10-08 MED ORDER — PHENYLEPHRINE 40 MCG/ML (10ML) SYRINGE FOR IV PUSH (FOR BLOOD PRESSURE SUPPORT)
PREFILLED_SYRINGE | INTRAVENOUS | Status: DC | PRN
  Administered 2020-10-08: 80 ug via INTRAVENOUS
  Administered 2020-10-08 (×2): 40 ug via INTRAVENOUS

## 2020-10-08 MED ORDER — ALBUMIN HUMAN 5 % IV SOLN: INTRAVENOUS | Status: DC | PRN

## 2020-10-08 MED ORDER — THROMBIN 5000 UNITS EX SOLR
OROMUCOSAL | Status: DC | PRN
  Administered 2020-10-08: 5 mL via TOPICAL

## 2020-10-08 MED ORDER — ONDANSETRON HCL 4 MG/2ML IJ SOLN
INTRAMUSCULAR | Status: DC | PRN
  Administered 2020-10-08: 4 mg via INTRAVENOUS

## 2020-10-08 MED ORDER — SODIUM CHLORIDE 0.9 % IV SOLN: INTRAVENOUS | Status: DC | PRN

## 2020-10-08 MED ORDER — THROMBIN 5000 UNITS EX SOLR
CUTANEOUS | Status: AC
  Filled 2020-10-08: qty 5000

## 2020-10-08 MED ORDER — FLEET ENEMA 7-19 GM/118ML RE ENEM: 1.0000 | ENEMA | Freq: Once | RECTAL | Status: DC | PRN

## 2020-10-08 MED ORDER — CHLORHEXIDINE GLUCONATE 0.12 % MT SOLN: 15.0000 mL | Freq: Once | OROMUCOSAL | Status: AC

## 2020-10-08 MED ORDER — ADULT MULTIVITAMIN W/MINERALS CH
1.0000 | ORAL_TABLET | Freq: Every day | ORAL | Status: DC
  Administered 2020-10-09 – 2020-10-10 (×2): 1 via ORAL
  Filled 2020-10-08 (×2): qty 1

## 2020-10-08 MED ORDER — POTASSIUM CHLORIDE CRYS ER 20 MEQ PO TBCR
60.0000 meq | EXTENDED_RELEASE_TABLET | Freq: Two times a day (BID) | ORAL | Status: DC
  Administered 2020-10-08 – 2020-10-10 (×4): 60 meq via ORAL
  Filled 2020-10-08 (×4): qty 3

## 2020-10-08 MED ORDER — ONDANSETRON HCL 4 MG/2ML IJ SOLN: 4.0000 mg | Freq: Once | INTRAMUSCULAR | Status: DC | PRN

## 2020-10-08 MED ORDER — SENNA 8.6 MG PO TABS
1.0000 | ORAL_TABLET | Freq: Two times a day (BID) | ORAL | Status: DC
  Administered 2020-10-08 – 2020-10-10 (×4): 8.6 mg via ORAL
  Filled 2020-10-08 (×4): qty 1

## 2020-10-08 MED ORDER — LABETALOL HCL 5 MG/ML IV SOLN
10.0000 mg | INTRAVENOUS | Status: DC | PRN
  Administered 2020-10-08: 10 mg via INTRAVENOUS

## 2020-10-08 MED ORDER — ACETAMINOPHEN 650 MG RE SUPP: 650.0000 mg | RECTAL | Status: DC | PRN

## 2020-10-08 MED ORDER — ENOXAPARIN SODIUM 40 MG/0.4ML ~~LOC~~ SOLN
40.0000 mg | SUBCUTANEOUS | Status: DC
  Administered 2020-10-09: 40 mg via SUBCUTANEOUS
  Filled 2020-10-08: qty 0.4

## 2020-10-08 MED ORDER — SODIUM CHLORIDE 0.9 % IV SOLN
0.0500 ug/kg/min | INTRAVENOUS | Status: AC
  Administered 2020-10-08: .1 ug/kg/min via INTRAVENOUS
  Filled 2020-10-08: qty 5000

## 2020-10-08 MED ORDER — LACTATED RINGERS IV SOLN: INTRAVENOUS | Status: DC | PRN

## 2020-10-08 MED ORDER — HEMOSTATIC AGENTS (NO CHARGE) OPTIME
TOPICAL | Status: DC | PRN
  Administered 2020-10-08: 1 via TOPICAL

## 2020-10-08 MED ORDER — LIDOCAINE 2% (20 MG/ML) 5 ML SYRINGE
INTRAMUSCULAR | Status: DC | PRN
  Administered 2020-10-08: 50 mg via INTRAVENOUS

## 2020-10-08 MED ORDER — MIDAZOLAM HCL 2 MG/2ML IJ SOLN
INTRAMUSCULAR | Status: AC
  Filled 2020-10-08: qty 2

## 2020-10-08 MED ORDER — DEXAMETHASONE SODIUM PHOSPHATE 4 MG/ML IJ SOLN
6.0000 mg | Freq: Four times a day (QID) | INTRAMUSCULAR | Status: DC
  Administered 2020-10-08 – 2020-10-09 (×4): 6 mg via INTRAVENOUS
  Filled 2020-10-08 (×4): qty 2

## 2020-10-08 MED ORDER — MIDAZOLAM HCL 5 MG/5ML IJ SOLN
INTRAMUSCULAR | Status: DC | PRN
  Administered 2020-10-08 (×2): 1 mg via INTRAVENOUS

## 2020-10-08 MED ORDER — HYDROCODONE-ACETAMINOPHEN 5-325 MG PO TABS
1.0000 | ORAL_TABLET | ORAL | Status: DC | PRN
  Administered 2020-10-09 (×2): 1 via ORAL
  Filled 2020-10-08 (×2): qty 1

## 2020-10-08 MED ORDER — BACITRACIN ZINC 500 UNIT/GM EX OINT
TOPICAL_OINTMENT | CUTANEOUS | Status: DC | PRN
  Administered 2020-10-08: 1 via TOPICAL

## 2020-10-08 MED ORDER — FENTANYL CITRATE (PF) 100 MCG/2ML IJ SOLN
25.0000 ug | INTRAMUSCULAR | Status: DC | PRN
  Administered 2020-10-08 (×3): 50 ug via INTRAVENOUS

## 2020-10-08 MED ORDER — CEFAZOLIN SODIUM-DEXTROSE 2-4 GM/100ML-% IV SOLN
2.0000 g | Freq: Three times a day (TID) | INTRAVENOUS | Status: AC
  Administered 2020-10-08 – 2020-10-09 (×3): 2 g via INTRAVENOUS
  Filled 2020-10-08 (×3): qty 100

## 2020-10-08 MED ORDER — ESMOLOL HCL 100 MG/10ML IV SOLN
INTRAVENOUS | Status: DC | PRN
  Administered 2020-10-08: 10 mg via INTRAVENOUS

## 2020-10-08 MED ORDER — HYDRALAZINE HCL 20 MG/ML IJ SOLN
INTRAMUSCULAR | Status: AC
  Filled 2020-10-08: qty 1

## 2020-10-08 MED ORDER — DOCUSATE SODIUM 100 MG PO CAPS
100.0000 mg | ORAL_CAPSULE | Freq: Two times a day (BID) | ORAL | Status: DC
  Administered 2020-10-09 – 2020-10-10 (×3): 100 mg via ORAL
  Filled 2020-10-08 (×3): qty 1

## 2020-10-08 MED ORDER — ROCURONIUM BROMIDE 10 MG/ML (PF) SYRINGE
PREFILLED_SYRINGE | INTRAVENOUS | Status: DC | PRN
  Administered 2020-10-08: 60 mg via INTRAVENOUS
  Administered 2020-10-08: 30 mg via INTRAVENOUS
  Administered 2020-10-08: 40 mg via INTRAVENOUS

## 2020-10-08 MED ORDER — CEFAZOLIN SODIUM-DEXTROSE 2-4 GM/100ML-% IV SOLN
INTRAVENOUS | Status: AC
  Filled 2020-10-08: qty 100

## 2020-10-08 MED ORDER — ONDANSETRON HCL 4 MG/2ML IJ SOLN
INTRAMUSCULAR | Status: AC
  Filled 2020-10-08: qty 2

## 2020-10-08 MED ORDER — LABETALOL HCL 5 MG/ML IV SOLN
10.0000 mg | INTRAVENOUS | Status: DC | PRN
  Administered 2020-10-08: 10 mg via INTRAVENOUS
  Filled 2020-10-08: qty 4

## 2020-10-08 MED ORDER — ONDANSETRON HCL 4 MG/2ML IJ SOLN
4.0000 mg | INTRAMUSCULAR | Status: DC | PRN
  Administered 2020-10-08 – 2020-10-09 (×2): 4 mg via INTRAVENOUS
  Filled 2020-10-08 (×2): qty 2

## 2020-10-08 MED ORDER — NICARDIPINE HCL IN NACL 20-0.86 MG/200ML-% IV SOLN
3.0000 mg/h | INTRAVENOUS | Status: DC
  Administered 2020-10-08 – 2020-10-09 (×4): 5 mg/h via INTRAVENOUS
  Filled 2020-10-08 (×4): qty 200

## 2020-10-08 SURGICAL SUPPLY — 108 items
APL SKNCLS STERI-STRIP NONHPOA (GAUZE/BANDAGES/DRESSINGS)
BAND INSRT 18 STRL LF DISP RB (MISCELLANEOUS) ×4
BAND RUBBER #18 3X1/16 STRL (MISCELLANEOUS) ×8 IMPLANT
BENZOIN TINCTURE PRP APPL 2/3 (GAUZE/BANDAGES/DRESSINGS) IMPLANT
BLADE CLIPPER SURG (BLADE) ×4 IMPLANT
BLADE SAW GIGLI 16 STRL (MISCELLANEOUS) IMPLANT
BLADE SURG 11 STRL SS (BLADE) IMPLANT
BLADE SURG 15 STRL LF DISP TIS (BLADE) IMPLANT
BLADE SURG 15 STRL SS (BLADE)
BNDG CMPR 75X41 PLY HI ABS (GAUZE/BANDAGES/DRESSINGS)
BNDG GAUZE ELAST 4 BULKY (GAUZE/BANDAGES/DRESSINGS) IMPLANT
BNDG STRETCH 4X75 STRL LF (GAUZE/BANDAGES/DRESSINGS) IMPLANT
BUR ACORN 9.0 PRECISION (BURR) ×3 IMPLANT
BUR ACORN 9.0MM PRECISION (BURR) ×1
BUR MATCHSTICK NEURO 3.0 LAGG (BURR) ×4 IMPLANT
BUR ROUND FLUTED 4 SOFT TCH (BURR) IMPLANT
BUR ROUND FLUTED 4MM SOFT TCH (BURR)
BUR SPIRAL ROUTER 2.3 (BUR) IMPLANT
BUR SPIRAL ROUTER 2.3MM (BUR)
CANISTER SUCT 3000ML PPV (MISCELLANEOUS) ×8 IMPLANT
CATH VENTRIC 35X38 W/TROCAR LG (CATHETERS) IMPLANT
CLIP VESOCCLUDE MED 6/CT (CLIP) IMPLANT
CNTNR URN SCR LID CUP LEK RST (MISCELLANEOUS) ×2 IMPLANT
CONT SPEC 4OZ STRL OR WHT (MISCELLANEOUS) ×4
COVER MAYO STAND STRL (DRAPES) IMPLANT
COVER WAND RF STERILE (DRAPES) IMPLANT
DECANTER SPIKE VIAL GLASS SM (MISCELLANEOUS) ×4 IMPLANT
DRAIN SUBARACHNOID (WOUND CARE) IMPLANT
DRAPE HALF SHEET 40X57 (DRAPES) ×8 IMPLANT
DRAPE MICROSCOPE LEICA (MISCELLANEOUS) ×4 IMPLANT
DRAPE NEUROLOGICAL W/INCISE (DRAPES) ×4 IMPLANT
DRAPE STERI IOBAN 125X83 (DRAPES) IMPLANT
DRAPE SURG 17X23 STRL (DRAPES) IMPLANT
DRAPE WARM FLUID 44X44 (DRAPES) ×4 IMPLANT
DRSG ADAPTIC 3X8 NADH LF (GAUZE/BANDAGES/DRESSINGS) IMPLANT
DRSG OPSITE POSTOP 4X6 (GAUZE/BANDAGES/DRESSINGS) ×4 IMPLANT
DRSG TELFA 3X8 NADH (GAUZE/BANDAGES/DRESSINGS) IMPLANT
DURAPREP 6ML APPLICATOR 50/CS (WOUND CARE) ×4 IMPLANT
ELECT COATED BLADE 2.86 ST (ELECTRODE) ×4 IMPLANT
ELECT REM PT RETURN 9FT ADLT (ELECTROSURGICAL) ×4
ELECTRODE REM PT RTRN 9FT ADLT (ELECTROSURGICAL) ×2 IMPLANT
EVACUATOR 1/8 PVC DRAIN (DRAIN) IMPLANT
EVACUATOR SILICONE 100CC (DRAIN) IMPLANT
FORCEPS BIPO MALIS IRRIG 9X1.5 (NEUROSURGERY SUPPLIES) ×4 IMPLANT
GAUZE 4X4 16PLY RFD (DISPOSABLE) IMPLANT
GAUZE SPONGE 4X4 12PLY STRL (GAUZE/BANDAGES/DRESSINGS) IMPLANT
GLOVE BIO SURGEON STRL SZ 6.5 (GLOVE) ×9 IMPLANT
GLOVE BIO SURGEON STRL SZ7.5 (GLOVE) ×12 IMPLANT
GLOVE BIO SURGEONS STRL SZ 6.5 (GLOVE) ×3
GLOVE BIOGEL PI IND STRL 7.5 (GLOVE) ×2 IMPLANT
GLOVE BIOGEL PI INDICATOR 7.5 (GLOVE) ×2
GLOVE ECLIPSE 7.5 STRL STRAW (GLOVE) IMPLANT
GLOVE EXAM NITRILE LRG STRL (GLOVE) IMPLANT
GLOVE EXAM NITRILE XL STR (GLOVE) IMPLANT
GLOVE SS BIOGEL STRL SZ 7 (GLOVE) ×2 IMPLANT
GLOVE SUPERSENSE BIOGEL SZ 7 (GLOVE) ×2
GLOVE SURG LTX SZ6.5 (GLOVE) ×4 IMPLANT
GLOVE SURG UNDER POLY LF SZ7.5 (GLOVE) ×4 IMPLANT
GOWN STRL REUS W/ TWL LRG LVL3 (GOWN DISPOSABLE) ×4 IMPLANT
GOWN STRL REUS W/ TWL XL LVL3 (GOWN DISPOSABLE) ×2 IMPLANT
GOWN STRL REUS W/TWL 2XL LVL3 (GOWN DISPOSABLE) IMPLANT
GOWN STRL REUS W/TWL LRG LVL3 (GOWN DISPOSABLE) ×8
GOWN STRL REUS W/TWL XL LVL3 (GOWN DISPOSABLE) ×4
GRAFT PERICARIUM DURAL BV 4X5 (Graft) ×4 IMPLANT
HEMOSTAT POWDER KIT SURGIFOAM (HEMOSTASIS) ×4 IMPLANT
HEMOSTAT SURGICEL 2X14 (HEMOSTASIS) ×4 IMPLANT
HOOK DURA 1/2IN (MISCELLANEOUS) IMPLANT
IV NS 1000ML (IV SOLUTION) ×4
IV NS 1000ML BAXH (IV SOLUTION) ×2 IMPLANT
KIT BASIN OR (CUSTOM PROCEDURE TRAY) ×4 IMPLANT
KIT DRAIN CSF ACCUDRAIN (MISCELLANEOUS) IMPLANT
KIT TURNOVER KIT B (KITS) ×4 IMPLANT
MARKER SKIN DUAL TIP RULER LAB (MISCELLANEOUS) ×4 IMPLANT
MARKER SPHERE PSV REFLC 13MM (MARKER) ×8 IMPLANT
NEEDLE HYPO 22GX1.5 SAFETY (NEEDLE) ×4 IMPLANT
NEEDLE SPNL 18GX3.5 QUINCKE PK (NEEDLE) IMPLANT
NS IRRIG 1000ML POUR BTL (IV SOLUTION) ×12 IMPLANT
PACK BATTERY CMF DISP FOR DVR (ORTHOPEDIC DISPOSABLE SUPPLIES) ×4 IMPLANT
PACK CRANIOTOMY CUSTOM (CUSTOM PROCEDURE TRAY) ×4 IMPLANT
PATTIES SURGICAL .25X.25 (GAUZE/BANDAGES/DRESSINGS) IMPLANT
PATTIES SURGICAL .5 X.5 (GAUZE/BANDAGES/DRESSINGS) IMPLANT
PATTIES SURGICAL .5 X3 (DISPOSABLE) IMPLANT
PATTIES SURGICAL 1/4 X 3 (GAUZE/BANDAGES/DRESSINGS) IMPLANT
PATTIES SURGICAL 1X1 (DISPOSABLE) IMPLANT
PIN MAYFIELD SKULL DISP (PIN) ×4 IMPLANT
PLATE MALL ROUND UNIV SM (Plate) ×4 IMPLANT
SCREW UNIII AXS SD 1.5X4 (Screw) ×32 IMPLANT
SEALANT ADHERUS EXTEND TIP (MISCELLANEOUS) ×4 IMPLANT
SET TUBING IRRIGATION DISP (TUBING) ×4 IMPLANT
SLEEVE SURGEON STRL (DRAPES) ×4 IMPLANT
SPONGE NEURO XRAY DETECT 1X3 (DISPOSABLE) IMPLANT
SPONGE SURGIFOAM ABS GEL 100 (HEMOSTASIS) ×4 IMPLANT
STAPLER VISISTAT 35W (STAPLE) ×4 IMPLANT
SUT ETHILON 3 0 FSL (SUTURE) IMPLANT
SUT ETHILON 3 0 PS 1 (SUTURE) IMPLANT
SUT MNCRL AB 3-0 PS2 18 (SUTURE) ×4 IMPLANT
SUT NURALON 4 0 TR CR/8 (SUTURE) ×12 IMPLANT
SUT SILK 0 TIES 10X30 (SUTURE) IMPLANT
SUT VIC AB 0 CT1 18XCR BRD8 (SUTURE) ×2 IMPLANT
SUT VIC AB 0 CT1 8-18 (SUTURE) ×4
SUT VIC AB 2-0 CP2 18 (SUTURE) ×8 IMPLANT
TOWEL GREEN STERILE (TOWEL DISPOSABLE) ×4 IMPLANT
TOWEL GREEN STERILE FF (TOWEL DISPOSABLE) ×4 IMPLANT
TRAY FOLEY MTR SLVR 16FR STAT (SET/KITS/TRAYS/PACK) ×4 IMPLANT
TUBE CONNECTING 12'X1/4 (SUCTIONS) ×1
TUBE CONNECTING 12X1/4 (SUCTIONS) ×3 IMPLANT
UNDERPAD 30X36 HEAVY ABSORB (UNDERPADS AND DIAPERS) IMPLANT
WATER STERILE IRR 1000ML POUR (IV SOLUTION) ×4 IMPLANT

## 2020-10-08 NOTE — Op Note (Signed)
Procedure(s): CRANIOTOMY - Suboccipital TUMOR EXCISION - Brain Lab APPLICATION OF CRANIAL NAVIGATION Procedure Note  Bentlie Withem female 64 y.o. 10/08/2020  Procedure(s) and Anesthesia Type:  1.  Suboccipital craniectomy for resection of right cerebellar tumor  2.  Cranioplasty with titanium mesh for skull defect of 3.5 cm in diameter  3.  Use of computer-assisted neuro navigation 4.  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  Her KPS was good and her systemic disease was well controlled.  After interdisciplinary discussion, recommendation was for preoperative SRS followed by resection given the size of the tumor in her posterior fossa.  Risks, benefits, alternatives, expected convalescence were discussed with the patient and her friend.  Risks discussed included, but were not limited to, bleeding, pain, infection, seizure, scar, stroke, neurologic deficit, spinal fluid leak, recurrence, and death.  Informed consent was obtained.     Surgeon: Vallarie Mare   Assistants: none  Anesthesia: General with endotracheal intubation   Procedure Detail Patient was brought to the operating room.  General anesthesia was induced and patient was intubated by the anesthesia service.  After appropriate lines and monitors were placed, patient was positioned prone on gel rolls with all pressure points padded and eyes protected.  Her head was gently flexed and placed in a [place] head holder and affixed to the bed.  A preoperative thin cut MRI was reconstructed in a 3D image.  This was used to perform surface match registration for computer-assisted neuro navigation throughout the case.  The posterior scalp was clipped, preprepped with alcohol prepped and draped in  sterile fashion.  A timeout was performed.  Preoperative antibiotics, dexamethasone, and mannitol were given.  Lidocaine with epinephrine was directed in the skin.  A linear incision was made over the midline from just over the external occipital protuberance down to the upper cervical spine.  The nuchal attachment was cut at the midline and the avascular plane down to the suboccipital bone.  Additionally, the right-sided nuchal attachment was cut more laterally, leaving a superior cuff for later reattachment.  This allowed greater exposure of the right suboccipital area.  The suboccipital areas exposed down to foramen magnum.  Self-retaining retractors were used.  Neuro navigation confirmed expected location of the torcula and transverse sinuses.  Bur holes were placed over the right suboccipital area and used to dissect the dura from the inner table skull.  Due to the awkward angle of her suboccipital bone as well as her body habitus which limited the amount of flexion we could achieve, a craniectomy rather than a craniotomy was performed.  Meticulous epidural hemostasis was obtained.  Her dura was very thin and incompetent in several areas.  The dura was opened with a 15 blade and reflected superiorly towards the transverse sinus.  The cerebellum was fairly relaxed.  Microscope was then introduced in the field to allow for intraoperative microdissection.  The cerebellar surface was bipolared and cut with microscissors.  The superficial capsule of the tumor was encountered and was quite thin.  Necrotic interior contents were spontaneously draining and removed with suction.  Pericapsular dissection was performed, dissecting the tumor from the white matter with coagulation of small white matter feeders.  This was most difficult in the superior aspects where the tumor was attached to the  superior surface of the cerebellum.  Again, given the patient's anatomy as well as body habitus, visualization was difficult in the  superior aspect.  The medial, lateral, inferior, and deep aspects were dissected and the superior aspect was pulled down into view.  Small feeders as well as small veins draining the tumor were coagulated and cut sharply.  The tumor was sent for pathology.  The cavity was inspected and no evidence of residual tumor was seen.  Meticulous hemostasis was obtained.  The dura was then reconstructed with a Integra dural replacement, which was stitched in.  There were several areas where her very thin dura could not accept stitches, so inlay of the dural graft was performed there.  The stitch line and the dural closure was reinforced with Adherus glue.  Meticulous epidural hemostasis was obtained.  A cranioplasty was performed with a small titanium mesh and screws to cover the defect approximately 3.5 cm x 3.5 cm.  The wound was irrigated thoroughly and the muscle layer was closed with 0 Vicryl stitches.  The fascia was closed with 0 Vicryl stitches including the transverse nuchal attachment incision on the right side.  The dermal layer was closed with 2-0 Vicryl stitches in buried infra fashion.  The skin was closed staples followed by bacitracin and sterile dressing.  Patient was then removed from Mayfield head holder and flipped supine and extubated by the anesthesia service following commands in all 4 extremities.  All counts were correct at the end of surgery.  No complications were noted.   Findings: Tumor successfully excised  Estimated Blood Loss:  200 ml         Drains: none         Blood Given: none          Specimens: Right cerebellar tumor         Implants: Stryker Titanium mesh and screws        Complications:  * No complications entered in OR log *         Disposition: PACU - hemodynamically stable.         Condition: stable

## 2020-10-08 NOTE — Progress Notes (Signed)
Pt's BP on the cuff consistently stayed 150s/70s for 35 min. Pt's BP goal per Dr. Marcello Moores is 387F-643P systolic with a MAP < 90. This RN contacted Marcello Moores and asked if pt was OK to go to ICU unit with cuff BP 157/77. He said OK to DC from PACU and get started on Cardine drip in the unit. ICU RN made aware during report. L radial ART line had whip-- readings from cuff mostly. ART line reading 180s/90s.

## 2020-10-08 NOTE — Transfer of Care (Signed)
Immediate Anesthesia Transfer of Care Note  Patient: Brianna Herring  Procedure(s) Performed: CRANIOTOMY - Suboccipital TUMOR EXCISION - Brain Lab (N/A Head) APPLICATION OF CRANIAL NAVIGATION (N/A )  Patient Location: PACU  Anesthesia Type:General  Level of Consciousness: drowsy and patient cooperative  Airway & Oxygen Therapy: Patient Spontanous Breathing and Patient connected to face mask oxygen  Post-op Assessment: Report given to RN and Post -op Vital signs reviewed and stable  Post vital signs: Reviewed and stable  Last Vitals:  Vitals Value Taken Time  BP 157/95 10/08/20 1335  Temp    Pulse 104 10/08/20 1338  Resp 26 10/08/20 1338  SpO2 96 % 10/08/20 1338  Vitals shown include unvalidated device data.  Last Pain:  Vitals:   10/08/20 0739  TempSrc:   PainSc: 0-No pain      Patients Stated Pain Goal: 2 (60/15/61 5379)  Complications: No complications documented.

## 2020-10-08 NOTE — Anesthesia Procedure Notes (Signed)
Arterial Line Insertion Start/End12/11/2019 8:00 AM, 10/08/2020 8:10 AM Performed by: Catalina Gravel, MD, Colin Benton, CRNA, CRNA  Patient location: Pre-op. Preanesthetic checklist: patient identified, IV checked, site marked, risks and benefits discussed, surgical consent, monitors and equipment checked, pre-op evaluation, timeout performed and anesthesia consent Lidocaine 1% used for infiltration Left, radial was placed Catheter size: 20 G Hand hygiene performed  and maximum sterile barriers used  Allen's test indicative of satisfactory collateral circulation Attempts: 1 Procedure performed without using ultrasound guided technique. Following insertion, Biopatch and dressing applied. Patient tolerated the procedure well with no immediate complications. Additional procedure comments: Performed by Marguarite Arbour Rickerts, SRNA.

## 2020-10-08 NOTE — Anesthesia Procedure Notes (Signed)
Procedure Name: Intubation Date/Time: 10/08/2020 9:00 AM Performed by: Colin Benton, CRNA Pre-anesthesia Checklist: Patient identified, Emergency Drugs available, Suction available and Patient being monitored Patient Re-evaluated:Patient Re-evaluated prior to induction Oxygen Delivery Method: Circle System Utilized Preoxygenation: Pre-oxygenation with 100% oxygen Induction Type: IV induction Ventilation: Mask ventilation without difficulty Laryngoscope Size: Mac and 3 Grade View: Grade I Tube type: Oral Tube size: 7.0 mm Number of attempts: 1 Airway Equipment and Method: Stylet Placement Confirmation: ETT inserted through vocal cords under direct vision,  positive ETCO2 and breath sounds checked- equal and bilateral Secured at: 22 cm Tube secured with: Tape Dental Injury: Teeth and Oropharynx as per pre-operative assessment  Comments: Performed by Sueanne Margarita, SRNA

## 2020-10-08 NOTE — H&P (Signed)
CC: Brain mass  HPI:     Patient is a 64 y.o. 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  After interdisciplinary discussion, preop SRS followed by resection was recommended. She underwent single dose SRS on 11/29 which she tolerated well and is here for resection.    Patient Active Problem List   Diagnosis Date Noted  . Brain metastasis ([place]) 09/19/2020  . 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 Multiple lung nodules on CT 03/14/2018  . 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 Endometrial adenocarcinoma ([place]) 11/03/2016   Past Medical History:  Diagnosis Date  . Cancer [facility])    endometrial  . Headache    patient denies this dx on 10/06/20  . History of radiation therapy 03/02/17-03/16/17   vaginal cuff treated to 18 Gy with 3 fractions of 6 Gy  . Localized swelling of both lower legs    wears compression hose  . Phlebitis alone left leg  4-5 yrs ago    Past Surgical History:  Procedure Laterality Date  . ROBOTIC ASSISTED TOTAL HYSTERECTOMY WITH BILATERAL SALPINGO OOPHERECTOMY N/A 11/18/2016   Procedure: XI ROBOTIC ASSISTED TOTAL HYSTERECTOMY WITH BILATERAL SALPINGO OOPHORECTOMY;  Surgeon: Everitt Amber, MD;  Location: [facility] ORS;  Service: Gynecology;  Laterality: N/A;  . SENTINEL NODE BIOPSY N/A 11/18/2016   Procedure: SENTINEL NODE BIOPSY;  Surgeon: Everitt Amber, MD;  Location: [facility] ORS;  Service: Gynecology;  Laterality: N/A;    Medications Prior to Admission  Medication Sig Dispense Refill Last Dose  . Calcium Carbonate (CALTRATE 600 PO) Take 1 tablet by mouth in the morning and at bedtime.   Past Week at Unknown time  . dexamethasone (DECADRON) 4 MG tablet Take 1 tablet (4 mg total) by mouth 2 (two) times daily with a meal. (Patient taking differently: Take 4 mg by mouth daily. Changed from BID to daily by Dr. Mickeal Skinner on 09/29/20) 60 tablet 0 10/08/2020 at 0600  .  Multiple Vitamin (MULTIVITAMIN WITH MINERALS) TABS tablet Take 1 tablet by mouth daily. Centrum Silver   Past Week at Unknown time  . potassium chloride SA (KLOR-CON) 20 MEQ tablet Take 60 mEq by mouth 2 (two) times daily.    Past Week at Unknown time  . Probiotic Product (PROBIOTIC PO) Take 1 capsule by mouth daily.   Past Week at Unknown time   No Known Allergies  Social History   Tobacco Use  . Smoking status: Never Smoker  . Smokeless tobacco: Never Used  Substance Use Topics  . Alcohol use: No    Family History  Problem Relation Age of Onset  . Lung cancer Father   . Breast cancer Sister      Review of Systems Pertinent items noted in HPI and remainder of comprehensive ROS otherwise negative.  Objective:   Patient Vitals for the past 8 hrs:  BP Temp Temp src Pulse Resp SpO2 Height Weight  10/08/20 0644 (!) 141/79 98.4 F (36.9 C) Oral 95 18 100 % '5\' 6"'  (1.676 m) 99.8 kg   No intake/output data recorded. No intake/output data recorded.      General : Alert, cooperative, no distress, appears stated age   Head:  [place]/AT   Eyes: PERRL, conjunctiva/corneas clear, EOM's intact. Fundi could not be visualized. Neck: Supple Chest:  Respirations unlabored Chest wall: no tenderness or deformity Heart: Regular rate and rhythm Abdomen: Soft, nontender and nondistended Extremities: warm and well-perfused Skin: normal turgor, color and texture  Neurologic:  Alert, oriented x 3.  Eyes open spontaneously. PERRL, EOMI, VFC, no facial droop. V1-3 intact.  No dysarthria, tongue protrusion symmetric.  CNII-XII intact. Normal strength, sensation and reflexes throughout.  No pronator drift, full strength in legs.  Right dysmetria and dysdiadochokinesia noted.       Data ReviewRadiology review: MRI reviewed personally.  Assessment:   Cerebellar metastasis s/p preop SRS  Plan:   - suboccipital craniotomy for resection today

## 2020-10-09 ENCOUNTER — Encounter (HOSPITAL_COMMUNITY): Payer: Self-pay | Admitting: Neurosurgery

## 2020-10-09 ENCOUNTER — Inpatient Hospital Stay (HOSPITAL_COMMUNITY): Payer: No Typology Code available for payment source

## 2020-10-09 LAB — BASIC METABOLIC PANEL
Anion gap: 16 — ABNORMAL HIGH (ref 5–15)
BUN: 7 mg/dL — ABNORMAL LOW (ref 8–23)
CO2: 22 mmol/L (ref 22–32)
Calcium: 9.3 mg/dL (ref 8.9–10.3)
Chloride: 97 mmol/L — ABNORMAL LOW (ref 98–111)
Creatinine, Ser: 0.61 mg/dL (ref 0.44–1.00)
GFR, Estimated: >60 mL/min (ref >60)
Glucose, Bld: 167 mg/dL — ABNORMAL HIGH (ref 70–99)
Potassium: 3.7 mmol/L (ref 3.5–5.1)
Sodium: 135 mmol/L (ref 135–145)

## 2020-10-09 LAB — CBC
HCT: 35.2 % — ABNORMAL LOW (ref 36.0–46.0)
Hemoglobin: 11.8 g/dL — ABNORMAL LOW (ref 12.0–15.0)
MCH: 29.3 pg (ref 26.0–34.0)
MCHC: 33.5 g/dL (ref 30.0–36.0)
MCV: 87.3 fL (ref 80.0–100.0)
Platelets: 150 10*3/uL (ref 150–400)
RBC: 4.03 MIL/uL (ref 3.87–5.11)
RDW: 15.4 % (ref 11.5–15.5)
WBC: 12 10*3/uL — ABNORMAL HIGH (ref 4.0–10.5)
nRBC: 0 % (ref 0.0–0.2)

## 2020-10-09 LAB — GLUCOSE, CAPILLARY
Glucose-Capillary: 163 mg/dL — ABNORMAL HIGH (ref 70–99)
Glucose-Capillary: 168 mg/dL — ABNORMAL HIGH (ref 70–99)
Glucose-Capillary: 196 mg/dL — ABNORMAL HIGH (ref 70–99)

## 2020-10-09 MED ORDER — GADOBUTROL 1 MMOL/ML IV SOLN
9.0000 mL | Freq: Once | INTRAVENOUS | Status: AC | PRN
  Administered 2020-10-09: 9 mL via INTRAVENOUS

## 2020-10-09 MED ORDER — DEXAMETHASONE 4 MG PO TABS: 2.0000 mg | ORAL_TABLET | Freq: Two times a day (BID) | ORAL | Status: DC

## 2020-10-09 MED ORDER — DEXAMETHASONE 0.5 MG PO TABS: 1.0000 mg | ORAL_TABLET | Freq: Two times a day (BID) | ORAL | Status: DC

## 2020-10-09 MED ORDER — ENSURE ENLIVE PO LIQD
237.0000 mL | ORAL | Status: DC
  Administered 2020-10-09: 237 mL via ORAL

## 2020-10-09 MED ORDER — DEXAMETHASONE 0.5 MG PO TABS: 1.0000 mg | ORAL_TABLET | Freq: Every day | ORAL | Status: DC

## 2020-10-09 MED ORDER — DEXAMETHASONE 4 MG PO TABS
4.0000 mg | ORAL_TABLET | Freq: Three times a day (TID) | ORAL | Status: AC
  Administered 2020-10-10 (×2): 4 mg via ORAL
  Filled 2020-10-09 (×2): qty 1

## 2020-10-09 MED ORDER — DEXAMETHASONE 4 MG PO TABS
4.0000 mg | ORAL_TABLET | Freq: Four times a day (QID) | ORAL | Status: AC
  Administered 2020-10-09 (×2): 4 mg via ORAL
  Filled 2020-10-09: qty 1

## 2020-10-09 MED ORDER — DEXAMETHASONE 4 MG PO TABS: 2.0000 mg | ORAL_TABLET | Freq: Three times a day (TID) | ORAL | Status: DC

## 2020-10-09 NOTE — Evaluation (Signed)
Physical Therapy Evaluation Patient Details Name: Mekia Dipinto MRN: [id number] DOB: 28-Jun-1956 Today's Date: 10/09/2020   History of Present Illness  Patient is a 64 y.o. 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  After interdisciplinary discussion, preop SRS followed by resection was recommended. She underwent single dose SRS on 11/29 which she tolerated well and is here for resection.  S/P resection 12/1.  Clinical Impression  Pt is at or close to baseline functioning and should be safe at her sister's home until all involved feel she is safe at home alone.. There are no further acute PT needs.  Will sign off at this time.     Follow Up Recommendations No PT follow up    Equipment Recommendations  None recommended by PT (family providing equipment)    Recommendations for Other Services       Precautions / Restrictions Precautions Precautions:  (low fall risk)      Mobility  Bed Mobility Overal bed mobility: Independent                  Transfers Overall transfer level: Independent                  Ambulation/Gait Ambulation/Gait assistance: Supervision;Modified independent (Device/Increase time) Gait Distance (Feet): 400 Feet Assistive device: None Gait Pattern/deviations: Step-through pattern   Gait velocity interpretation: >2.62 ft/sec, indicative of community ambulatory General Gait Details: steady with ability to attain age appropriate speeds, No overt deviation with balance challenge.  Stairs Stairs: Yes Stairs assistance: Supervision Stair Management: One rail Right;Alternating pattern;Forwards Number of Stairs: 2 General stair comments: safe with rail  Wheelchair Mobility    Modified Rankin (Stroke Patients Only)       Balance Overall balance assessment: Independent                               Standardized Balance Assessment Standardized Balance Assessment :  Dynamic Gait Index   Dynamic Gait Index Level Surface: Normal Change in Gait Speed: Normal Gait with Horizontal Head Turns: Normal Gait with Vertical Head Turns: Normal Gait and Pivot Turn: Mild Impairment Step Over Obstacle: Normal Step Around Obstacles: Normal Steps: Mild Impairment Total Score: 22       Pertinent Vitals/Pain Pain Assessment: No/denies pain    Home Living Family/patient expects to be discharged to:: Private residence Living Arrangements: Other relatives (home with sister in the short-term) Available Help at Discharge: Family;Available 24 hours/day Type of Home: House Home Access: Stairs to enter   [employer] of Steps: 1 Home Layout: One level Home Equipment: Walker - 2 wheels;Cane - single point;Shower seat      Prior Function Level of Independence: Independent               Hand Dominance        Extremity/Trunk Assessment   Upper Extremity Assessment Upper Extremity Assessment: Overall WFL for tasks assessed (mild proximal weakness bil)    Lower Extremity Assessment Lower Extremity Assessment: Overall WFL for tasks assessed (generally functional coordination)       Communication   Communication: No difficulties  Cognition Arousal/Alertness: Awake/alert Behavior During Therapy: WFL for tasks assessed/performed Overall Cognitive Status: Within Functional Limits for tasks assessed  General Comments General comments (skin integrity, edema, etc.): HR rose from low 100's initially to low 130's with gait activity.    Exercises     Assessment/Plan    PT Assessment Patent does not need any further PT services  PT Problem List         PT Treatment Interventions      PT Goals (Current goals can be found in the Care Plan section)  Acute Rehab PT Goals PT Goal Formulation: All assessment and education complete, DC therapy    Frequency     Barriers to discharge         Co-evaluation               AM-PAC PT "6 Clicks" Mobility  Outcome Measure Help needed turning from your back to your side while in a flat bed without using bedrails?: None Help needed moving from lying on your back to sitting on the side of a flat bed without using bedrails?: None Help needed moving to and from a bed to a chair (including a wheelchair)?: None Help needed standing up from a chair using your arms (e.g., wheelchair or bedside chair)?: None Help needed to walk in hospital room?: None Help needed climbing 3-5 steps with a railing? : A Little 6 Click Score: 23    End of Session   Activity Tolerance: Patient tolerated treatment well Patient left: in bed;with call bell/phone within reach;with family/visitor present Nurse Communication: Mobility status PT Visit Diagnosis: Other abnormalities of gait and mobility (R26.89)    Time: 6060-0459 PT Time Calculation (min) (ACUTE ONLY): 24 min   Charges:   PT Evaluation $PT Eval Low Complexity: 1 Low PT Treatments $Gait Training: 8-22 mins        10/09/2020  Ginger Carne., PT Acute Rehabilitation Services [phone]  (pager) [phone]  (office)  Tessie Fass Tashiba Timoney 10/09/2020, 2:07 PM

## 2020-10-09 NOTE — Progress Notes (Signed)
Initial Nutrition Assessment  DOCUMENTATION CODES:   Obesity unspecified  INTERVENTION:   Ensure Enlive po daily, each supplement provides 350 kcal and 20 grams of protein  Encouraged use of oral nutrition supplements if appetite decreased to prevent unintentional weight loss.   NUTRITION DIAGNOSIS:   Inadequate oral intake related to decreased appetite as evidenced by per patient/family report.  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    Pt discussed during ICU rounds and with RN.  Spoke with pt and sister who was at her bedside.  Per pt she lives alone and drives herself to get take out and groceries. Pt has not driven in 2 weeks prior to surgery so sister has gotten her groceries and picked up food for her. She reports that her appetite has been decreased recently and had noticed a change in her clothes but is unable to confirm usual body weight.  Per chart review pt was 230 lb 4/21 and on admission is 220 lb which is a 4% weight loss x 8 months.  Pt reports her intake Breakfast: biscuit from fast food Lunch: ham sandwich with fruit cup Dinner: ham sandwich with fruit or grilled chicken sandwich  If she has a snack it is a fruit cup.  She has not tried ensure or boost but had considered it. Pt ate 100% of breakfast this am and sister helping with lunch order after our visit.   Medications reviewed and include: decadron, colace, MVI with minerals, KCl, senna cardene  Labs reviewed:  CBG's: [phone]    NUTRITION - FOCUSED PHYSICAL EXAM:    Most Recent Value  Orbital Region No depletion  Upper Arm Region No depletion  Thoracic and Lumbar Region No depletion   Buccal Region No depletion  Temple Region No depletion  Clavicle Bone Region No depletion  Clavicle and Acromion Bone Region No depletion  Scapular Bone Region Unable to assess  Dorsal Hand Mild depletion  Patellar Region No depletion  Anterior Thigh Region No depletion  Posterior Calf Region No depletion  Edema (RD Assessment) None  Hair Reviewed  Eyes Reviewed  Mouth Reviewed  Skin Reviewed  Nails Reviewed       Diet Order:   Diet Order            Diet regular Room service appropriate? Yes with Assist; Fluid consistency: Thin  Diet effective now                 EDUCATION NEEDS:   Education needs have been addressed  Skin:  Skin Assessment: Reviewed RN Assessment  Last BM:  11/30  Height:   Ht Readings from Last 1 Encounters:  10/08/20 5\' 6"  (1.676 m)    Weight:   Wt Readings from Last 1 Encounters:  10/08/20 99.8 kg    Ideal Body Weight:  61.3 kg  BMI:  Body mass index is 35.51 kg/m.  Estimated Nutritional Needs:   Kcal:  1800-2000  Protein:  90-110 grams  Fluid:  > 1.8 L/day  Lockie Pares., RD, LDN, CNSC See AMiON for contact information

## 2020-10-09 NOTE — Progress Notes (Signed)
Subjective: Patient reports no significant headache or neurologic symptoms  Objective: Vital signs in last 24 hours: Temp:  [97.8 F (36.6 C)-99.5 F (37.5 C)] 99.5 F (37.5 C) (12/02 0800) Pulse Rate:  [93-115] 107 (12/02 0930) Resp:  [13-38] 21 (12/02 1000) BP: (70-167)/(40-100) 126/62 (12/02 1000) SpO2:  [95 %-100 %] 99 % (12/02 0930) Arterial Line BP: (129-197)/(56-83) 156/66 (12/02 0930)  Intake/Output from previous day: 12/01 0701 - 12/02 0700 In: 3985.9 [I.V.:2985.9; IV Piggyback:1000.1] Out: 6050 [Urine:5650; Blood:400] Intake/Output this shift: Total I/O In: 691.1 [P.O.:420; I.V.:171; IV Piggyback:100.1] Out: 950 [Urine:950]  Awake, alert, Ox3. FC x 4.  Mild L>R dysdiadochokinesia Redness of honeycomb dressing but no ongoing leaking  Lab Results: Recent Labs    10/08/20 1109 10/09/20 0626  WBC  --  12.0*  HGB 11.6* 11.8*  HCT 34.0* 35.2*  PLT  --  150   BMET Recent Labs    10/08/20 0658 10/08/20 0959 10/08/20 1109 10/09/20 0626  NA 137   < > 134* 135  K 3.3*   < > 3.4* 3.7  CL 98  --   --  97*  CO2  --   --   --  22  GLUCOSE 113*  --   --  167*  BUN 21  --   --  7*  CREATININE 0.80  --   --  0.61  CALCIUM  --   --   --  9.3   < > = values in this interval not displayed.    Studies/Results: MR BRAIN W WO CONTRAST  Result Date: 10/09/2020 CLINICAL DATA:  Metastatic endometrial cancer post surgery, follow-up EXAM: MRI HEAD WITHOUT AND WITH CONTRAST TECHNIQUE: Multiplanar, multiecho pulse sequences of the brain and surrounding structures were obtained without and with intravenous contrast. CONTRAST:  55m GADAVIST GADOBUTROL 1 MMOL/ML IV SOLN COMPARISON:  09/26/2020 FINDINGS: Brain: There are new postoperative changes of right cerebellar mass resection. Resection cavity is present containing fluid, blood products, and air. Intrinsic T1 shortening is noted. Minimal likely postoperative enhancement is present. Mild residual surrounding edema with minor  mass effect. Additional scattered foci of pneumocephalus. Extra-axial air and blood along the right tentorium. Small focus of diffusion hyperintensity along the splenium of the corpus callosum without true restriction. There is reduced diffusion along the surgical cavity margins likely reflecting postoperative contusion. Vascular: Major vessel flow voids at the skull base are preserved. Skull and upper cervical spine: Normal marrow signal is preserved. Sinuses/Orbits: Paranasal sinuses are aerated. Orbits are unremarkable. Other: Reported asymmetric pituitary enlargement is not well evaluated on this study. Mastoid air cells are clear. IMPRESSION: Expected postoperative changes post gross total resection of right cerebellar metastasis. Electronically Signed   By: PMacy MisM.D.   On: 10/09/2020 11:44    Assessment/Plan: S/p resection of cerebellar met - downgrade - mobilize - Dex taper  JVallarie Mare12/12/2019, 12:13 PM

## 2020-10-10 LAB — SURGICAL PATHOLOGY

## 2020-10-10 MED ORDER — HYDROCODONE-ACETAMINOPHEN 5-325 MG PO TABS: 1.0000 | ORAL_TABLET | ORAL | 0 refills | Status: DC | PRN

## 2020-10-10 MED ORDER — DOCUSATE SODIUM 100 MG PO CAPS
100.0000 mg | ORAL_CAPSULE | Freq: Two times a day (BID) | ORAL | 2 refills | Status: DC
Start: 2020-10-10 — End: 2021-07-23

## 2020-10-10 MED ORDER — DEXAMETHASONE 2 MG PO TABS: ORAL_TABLET | ORAL | 0 refills | Status: AC

## 2020-10-10 NOTE — Plan of Care (Signed)
  Problem: Education: Goal: Required Educational Video(s) Outcome: Completed/Met   Problem: Clinical Measurements: Goal: Ability to maintain clinical measurements within normal limits will improve Outcome: Completed/Met Goal: Postoperative complications will be avoided or minimized Outcome: Completed/Met   Problem: Skin Integrity: Goal: Demonstration of wound healing without infection will improve Outcome: Completed/Met

## 2020-10-10 NOTE — Discharge Summary (Signed)
  Physician Discharge Summary  Patient ID: Brianna Herring MRN: [id number] DOB/AGE: 07/28/1956 64 y.o.  Admit date: 10/08/2020 Discharge date: 10/10/2020  Admission Diagnoses:  Cerebellar metastasis  Discharge Diagnoses:  Same Active Problems:   Brain metastasis [facility])   Discharged Condition: Stable  Hospital Course:  Brianna Herring is a 64 y.o. female with metastatic endometrial cancer who had a right cerebellar metastasis.  After interdisciplinary discussion, preoperative SRS followed by resection was recommended.  After undergoing SRS, patient underwent suboccipital craniotomy for resection of the lesion.  Postoperatively, she was monitored in the ICU.  A postop day #1 MRI showed gross total resection and no significant cerebellar swelling.  She was transferred down to the stepdown unit where she continued with mobilization.  She was deemed ready for discharge home on 10/10/2020.  She was tolerating a regular diet, her pain was well controlled, her dressing was dry.  Treatments: Surgery -right suboccipital craniotomy for resection of brain metastasis  Discharge Exam: Blood pressure 137/69, pulse (!) 102, temperature 98 F (36.7 C), temperature source Oral, resp. rate 15, height 5\' 6"  (1.676 m), weight 99.8 kg, SpO2 99 %. Awake, alert, oriented Speech fluent, appropriate CN grossly intact 5/5 BUE/BLE Wound c/d/i  Disposition: Discharge disposition: 01-Home or Self Care        Allergies as of 10/10/2020   No Known Allergies     Medication List    TAKE these medications   CALTRATE 600 PO Take 1 tablet by mouth in the morning and at bedtime.   dexamethasone 2 MG tablet Commonly known as: DECADRON Take 1 tablet (2 mg total) by mouth every 8 (eight) hours for 2 days, THEN 1 tablet (2 mg total) 2 (two) times daily for 2 days, THEN 0.5 tablets (1 mg total) 2 (two) times daily for 2 days, THEN 0.5 tablets (1 mg total) daily for 2 days. Start taking on:  October 10, 2020 What changed:   medication strength  See the new instructions.   docusate sodium 100 MG capsule Commonly known as: COLACE Take 1 capsule (100 mg total) by mouth 2 (two) times daily.   HYDROcodone-acetaminophen 5-325 MG tablet Commonly known as: NORCO/VICODIN Take 1 tablet by mouth every 4 (four) hours as needed for moderate pain or severe pain.   multivitamin with minerals Tabs tablet Take 1 tablet by mouth daily. Centrum Silver   potassium chloride SA 20 MEQ tablet Commonly known as: KLOR-CON Take 60 mEq by mouth 2 (two) times daily.   PROBIOTIC PO Take 1 capsule by mouth daily.       Follow-up Information    Vallarie Mare, MD Follow up in 2 week(s).   Specialty: Neurosurgery Contact information: [address] [address] [phone]               Signed: Vallarie Mare 10/10/2020, 9:10 AM

## 2020-10-10 NOTE — Anesthesia Postprocedure Evaluation (Signed)
Anesthesia Post Note  Patient: Brianna Herring  Procedure(s) Performed: CRANIOTOMY - Suboccipital TUMOR EXCISION - Brain Lab (N/A Head) APPLICATION OF CRANIAL NAVIGATION (N/A )     Patient location during evaluation: PACU Anesthesia Type: General Level of consciousness: awake and alert Pain management: pain level controlled Vital Signs Assessment: post-procedure vital signs reviewed and stable Respiratory status: spontaneous breathing, nonlabored ventilation, respiratory function stable and patient connected to nasal cannula oxygen Cardiovascular status: blood pressure returned to baseline and stable Postop Assessment: no apparent nausea or vomiting Anesthetic complications: no   No complications documented.  Last Vitals:  Vitals:   10/09/20 2325 10/10/20 0339  BP: 139/70 (!) 142/79  Pulse: (!) 104 83  Resp: 14 18  Temp: 36.5 C 36.8 C  SpO2: 94% 96%    Last Pain:  Vitals:   10/10/20 0339  TempSrc: Oral  PainSc:                  Catalina Gravel

## 2020-10-10 NOTE — Discharge Instructions (Signed)
Can remove transparent dressing and shower 12/4 Walk as much as possible No heavy lifting >10 lbs

## 2020-10-10 NOTE — Progress Notes (Addendum)
Discharge instructions, RX's and follow up appts explained and provided to patient and caregiver, verbalized understanding. Patient left floor via wheelchair accompanied by staff.  No c/o pain at discharge.   Normagene Harvie, Tivis Ringer, RN

## 2020-10-13 ENCOUNTER — Other Ambulatory Visit: Payer: Self-pay | Admitting: Hematology and Oncology

## 2020-10-13 ENCOUNTER — Inpatient Hospital Stay: Payer: PRIVATE HEALTH INSURANCE | Attending: Hematology and Oncology | Admitting: Internal Medicine

## 2020-10-13 ENCOUNTER — Other Ambulatory Visit: Payer: Self-pay

## 2020-10-13 VITALS — BP 142/77 | HR 101 | Temp 98.8°F | Resp 19 | Ht 66.0 in | Wt 217.7 lb

## 2020-10-13 DIAGNOSIS — R42 Dizziness and giddiness: Secondary | ICD-10-CM | POA: Diagnosis not present

## 2020-10-13 DIAGNOSIS — C541 Malignant neoplasm of endometrium: Secondary | ICD-10-CM

## 2020-10-13 DIAGNOSIS — C7802 Secondary malignant neoplasm of left lung: Secondary | ICD-10-CM

## 2020-10-13 DIAGNOSIS — C7931 Secondary malignant neoplasm of brain: Secondary | ICD-10-CM | POA: Insufficient documentation

## 2020-10-13 DIAGNOSIS — C7801 Secondary malignant neoplasm of right lung: Secondary | ICD-10-CM

## 2020-10-13 NOTE — Progress Notes (Signed)
[place] [facility] [place], [place] [address] [phone]   Interval Evaluation  Date of Service: 10/13/20 Patient Name: Brianna Herring Patient MRN: [id number] Patient DOB: [birth year] Provider: Ventura Sellers, MD  Identifying Statement:  Brianna Herring is a 64 y.o. female with Brain metastasis [facility]) [C79.31]   Primary Cancer: Endometrial, Stage IV  CNS Oncologic History 10/06/20: Pre-op SRS R cerebellar metastasis Brianna Herring) 10/08/20: Craniotomy, resection Brianna Herring)  Interval History:  Brianna Herring presents today for follow up after hospitalization for craniotomy with Dr. Marcello Herring.  She actually describes somewhat improved function in terms of gait and mobility.  No new or progressive neurologic deficits.  Continues to have some dizziness upon standing at times.  Denies seizures or headaches.    H+P (10/13/20) Patient presented to medical attention with new onset complaint of headache and gait instability, earlier this month.  Symptoms had been progressive over several weeks, at least.  Headaches were holocranial and occurring in the AM.  Gait issues were non specific, she never needed gait assist aside from "holding on" to family and objects.  Decadron was started by Dr. Hinton Rao, this led to considerable improvement and essentially resolution of clinical complaints.  She is currently dosing 4mg  twice per day.    Medications: Current Outpatient Medications on File Prior to Visit  Medication Sig Dispense Refill  . Calcium Carbonate (CALTRATE 600 PO) Take 1 tablet by mouth in the morning and at bedtime.    Marland Kitchen dexamethasone (DECADRON) 2 MG tablet Take 1 tablet (2 mg total) by mouth every 8 (eight) hours for 2 days, THEN 1 tablet (2 mg total) 2 (two) times daily for 2 days, THEN 0.5 tablets (1 mg total) 2 (two) times daily for 2 days, THEN 0.5 tablets (1 mg total) daily for 2 days. 13 tablet 0  . docusate sodium  (COLACE) 100 MG capsule Take 1 capsule (100 mg total) by mouth 2 (two) times daily. 60 capsule 2  . HYDROcodone-acetaminophen (NORCO/VICODIN) 5-325 MG tablet Take 1 tablet by mouth every 4 (four) hours as needed for moderate pain or severe pain. 30 tablet 0  . Multiple Vitamin (MULTIVITAMIN WITH MINERALS) TABS tablet Take 1 tablet by mouth daily. Centrum Silver    . potassium chloride SA (KLOR-CON) 20 MEQ tablet Take 60 mEq by mouth 2 (two) times daily.     . Probiotic Product (PROBIOTIC PO) Take 1 capsule by mouth daily.     No current facility-administered medications on file prior to visit.    Allergies: No Known Allergies Past Medical History:  Past Medical History:  Diagnosis Date  . Cancer [facility])    endometrial  . Headache    patient denies this dx on 10/06/20  . History of radiation therapy 03/02/17-03/16/17   vaginal cuff treated to 18 Gy with 3 fractions of 6 Gy  . Localized swelling of both lower legs    wears compression hose  . Phlebitis alone left leg  4-5 yrs ago   Past Surgical History:  Past Surgical History:  Procedure Laterality Date  . APPLICATION OF CRANIAL NAVIGATION N/A 10/08/2020   Procedure: APPLICATION OF CRANIAL NAVIGATION;  Surgeon: Vallarie Mare, MD;  Location: [facility];  Service: Neurosurgery;  Laterality: N/A;  . CRANIOTOMY N/A 10/08/2020   Procedure: CRANIOTOMY - Suboccipital TUMOR EXCISION - Brain Lab;  Surgeon: Vallarie Mare, MD;  Location: [facility];  Service: Neurosurgery;  Laterality: N/A;  suboccipital  . ROBOTIC  ASSISTED TOTAL HYSTERECTOMY WITH BILATERAL SALPINGO OOPHERECTOMY N/A 11/18/2016   Procedure: XI ROBOTIC ASSISTED TOTAL HYSTERECTOMY WITH BILATERAL SALPINGO OOPHORECTOMY;  Surgeon: Everitt Amber, MD;  Location: [facility];  Service: Gynecology;  Laterality: N/A;  . SENTINEL NODE BIOPSY N/A 11/18/2016   Procedure: SENTINEL NODE BIOPSY;  Surgeon: Everitt Amber, MD;  Location: [facility];  Service: Gynecology;  Laterality: N/A;   Social History:  Social  History   Socioeconomic History  . Marital status: Single    Spouse name: Not on file  . Number of children: 0  . Years of education: Not on file  . Highest education level: Not on file  Occupational History  . Occupation: sewer  Tobacco Use  . Smoking status: Never Smoker  . Smokeless tobacco: Never Used  Vaping Use  . Vaping Use: Never used  Substance and Sexual Activity  . Alcohol use: No  . Drug use: No  . Sexual activity: Not Currently    Birth control/protection: Surgical    Comment: Hysterectomy  Other Topics Concern  . Not on file  Social History Narrative  . Not on file   Social Determinants of Health   Financial Resource Strain:   . Difficulty of Paying Living Expenses: Not on file  Food Insecurity:   . Worried About Charity fundraiser in the Last Year: Not on file  . Ran Out of Food in the Last Year: Not on file  Transportation Needs:   . Lack of Transportation (Medical): Not on file  . Lack of Transportation (Non-Medical): Not on file  Physical Activity:   . Days of Exercise per Week: Not on file  . Minutes of Exercise per Session: Not on file  Stress:   . Feeling of Stress : Not on file  Social Connections:   . Frequency of Communication with Friends and Family: Not on file  . Frequency of Social Gatherings with Friends and Family: Not on file  . Attends Religious Services: Not on file  . Active Member of Clubs or Organizations: Not on file  . Attends Archivist Meetings: Not on file  . Marital Status: Not on file  Intimate Partner Violence:   . Fear of Current or Ex-Partner: Not on file  . Emotionally Abused: Not on file  . Physically Abused: Not on file  . Sexually Abused: Not on file   Family History:  Family History  Problem Relation Age of Onset  . Lung cancer Father   . Breast cancer Sister     Review of Systems: Constitutional: Doesn't report fevers, chills or abnormal weight loss Eyes: Doesn't report blurriness of  vision Ears, nose, mouth, throat, and face: Doesn't report sore throat Respiratory: Doesn't report cough, dyspnea or wheezes Cardiovascular: Doesn't report palpitation, chest discomfort  Gastrointestinal:  Doesn't report nausea, constipation, diarrhea GU: Doesn't report incontinence Skin: Doesn't report skin rashes Neurological: Per HPI Musculoskeletal: Doesn't report joint pain Behavioral/Psych: Doesn't report anxiety  Physical Exam: Vitals:   10/13/20 0920  BP: (!) 142/77  Pulse: (!) 101  Resp: 19  Temp: 98.8 F (37.1 C)  SpO2: 99%   KPS: 80. General: Alert, cooperative, pleasant, in no acute distress Head: Normal EENT: No conjunctival injection or scleral icterus.  Lungs: Resp effort normal Cardiac: Regular rate Abdomen: Non-distended abdomen Skin: No rashes cyanosis or petechiae. Extremities: No clubbing or edema  Neurologic Exam: Mental Status: Awake, alert, attentive to examiner. Oriented to self and environment. Language is fluent with intact comprehension.  Age advanced psychomotor  slowing. Cranial Nerves: Visual acuity is grossly normal. Visual fields are full. Extra-ocular movements intact. No ptosis. Face is symmetric Motor: Tone and bulk are normal. Power is full in both arms and legs. Reflexes are symmetric, no pathologic reflexes present.  Sensory: Intact to light touch Gait: Normal.   Labs: I have reviewed the data as listed    Component Value Date/Time   NA 135 10/09/2020 0626   NA 139 09/19/2020 0000   NA 139 11/03/2016 1023   K 3.7 10/09/2020 0626   K 3.8 11/03/2016 1023   CL 97 (L) 10/09/2020 0626   CO2 22 10/09/2020 0626   CO2 26 11/03/2016 1023   GLUCOSE 167 (H) 10/09/2020 0626   GLUCOSE 134 11/03/2016 1023   BUN 7 (L) 10/09/2020 0626   BUN 17 09/19/2020 0000   BUN 11.6 11/03/2016 1023   CREATININE 0.61 10/09/2020 0626   CREATININE 0.8 11/03/2016 1023   CALCIUM 9.3 10/09/2020 0626   CALCIUM 9.6 11/03/2016 1023   PROT 7.8 11/03/2016 1023    ALBUMIN 4.7 09/19/2020 0000   ALBUMIN 3.5 11/03/2016 1023   AST 38 (A) 09/19/2020 0000   AST 24 11/03/2016 1023   ALT 22 09/19/2020 0000   ALT 17 11/03/2016 1023   ALKPHOS 72 09/19/2020 0000   ALKPHOS 78 11/03/2016 1023   BILITOT 0.62 11/03/2016 1023   GFRNONAA >60 10/09/2020 0626   GFRAA >60 08/13/2018 0236   Lab Results  Component Value Date   WBC 12.0 (H) 10/09/2020   NEUTROABS 4.69 09/19/2020   HGB 11.8 (L) 10/09/2020   HCT 35.2 (L) 10/09/2020   MCV 87.3 10/09/2020   PLT 150 10/09/2020    Imaging:  [place] Clinician Interpretation: I have personally reviewed the CNS images as listed.  My interpretation, in the context of the patient's clinical presentation, is stable disease  MR BRAIN W WO CONTRAST  Result Date: 10/09/2020 CLINICAL DATA:  Metastatic endometrial cancer post surgery, follow-up EXAM: MRI HEAD WITHOUT AND WITH CONTRAST TECHNIQUE: Multiplanar, multiecho pulse sequences of the brain and surrounding structures were obtained without and with intravenous contrast. CONTRAST:  25mL GADAVIST GADOBUTROL 1 MMOL/ML IV SOLN COMPARISON:  09/26/2020 FINDINGS: Brain: There are new postoperative changes of right cerebellar mass resection. Resection cavity is present containing fluid, blood products, and air. Intrinsic T1 shortening is noted. Minimal likely postoperative enhancement is present. Mild residual surrounding edema with minor mass effect. Additional scattered foci of pneumocephalus. Extra-axial air and blood along the right tentorium. Small focus of diffusion hyperintensity along the splenium of the corpus callosum without true restriction. There is reduced diffusion along the surgical cavity margins likely reflecting postoperative contusion. Vascular: Major vessel flow voids at the skull base are preserved. Skull and upper cervical spine: Normal marrow signal is preserved. Sinuses/Orbits: Paranasal sinuses are aerated. Orbits are unremarkable. Other: Reported asymmetric  pituitary enlargement is not well evaluated on this study. Mastoid air cells are clear. IMPRESSION: Expected postoperative changes post gross total resection of right cerebellar metastasis. Electronically Signed   By: Macy Mis M.D.   On: 10/09/2020 11:44   MR Brain W Wo Contrast  Result Date: 09/26/2020 CLINICAL DATA:  Recurrent high-grade endometrial cancer. EXAM: MRI HEAD WITHOUT AND WITH CONTRAST TECHNIQUE: Multiplanar, multiecho pulse sequences of the brain and surrounding structures were obtained without and with intravenous contrast. CONTRAST:  81mL GADAVIST GADOBUTROL 1 MMOL/ML IV SOLN COMPARISON:  Head CT 09/19/2020 FINDINGS: Brain: A partially necrotic or cystic right cerebellar mass with irregular nodular peripheral enhancement measures  2.8 x 2.7 cm. There is moderate surrounding vasogenic edema with partial effacement of the fourth ventricle but no evidence of obstructive hydrocephalus. No acute infarct, intracranial hemorrhage, midline shift, or extra-axial fluid collection is identified. There is mild cerebral atrophy. Small T2 hyperintensities scattered throughout the subcortical and deep cerebral white matter bilaterally are nonspecific but compatible with mild-to-moderate chronic small vessel ischemic disease. 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 There is asymmetric enlargement of the pituitary gland on the right with evidence of an underlying 6 mm enhancing mass. Vascular: Major intracranial vascular flow voids are preserved. Skull and upper cervical spine: Unremarkable bone marrow signal. Sinuses/Orbits: Unremarkable orbits. Paranasal sinuses and mastoid air cells are clear. Other: None. IMPRESSION: 1. 2.7 cm right cerebellar mass consistent with a metastasis. Moderate vasogenic edema with partial effacement of the fourth ventricle but no evidence of obstructive hydrocephalus. 2. 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 Attention on follow-up. 3. Mild-to-moderate chronic small vessel ischemic disease. Electronically Signed   By: Logan Bores M.D.   On: 09/26/2020 17:35    Assessment/Plan Brain metastasis ([place]) [C79.31]  [name] is clinically improved, now having completed pre-operative SRS and craniotomy for posterior fossa metastasis.  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  MRI demonstrates stable resection cavity without additional/new lesions.  She will complete decadron taper later this week per Dr. Marcello Herring' instruction post-op.  We appreciate the opportunity to participate in the care of Ziyan Hillmer.  We ask that Elise Knobloch return to clinic in 3 months following next brain MRI, or sooner as needed.  All questions were answered. The patient knows to call the clinic with any problems, questions or concerns. No barriers to learning were detected.  The total time spent in the encounter was 30 minutes and more than 50% was on counseling and review of test results   Ventura Sellers, MD Medical Director of Neuro-Oncology [facility] [place] 10/13/20 9:15 AM

## 2020-10-16 ENCOUNTER — Other Ambulatory Visit: Payer: Self-pay | Admitting: *Deleted

## 2020-10-16 DIAGNOSIS — Z95828 Presence of other vascular implants and grafts: Secondary | ICD-10-CM

## 2020-10-27 ENCOUNTER — Other Ambulatory Visit: Payer: Self-pay | Admitting: Radiation Therapy

## 2020-10-29 NOTE — Progress Notes (Signed)
  Patient Name: Brianna Herring MRN: [id number] DOB: [birth year] Referring Physician: Christa See (Profile Not Attached) Date of Service: 10/06/2020 [facility], [place]                                                        End Of Treatment Note  Diagnoses: C79.31-Secondary malignant neoplasm of brain  Cancer Staging: STAGE IV  Intent: Palliative  Radiation Treatment Dates: 10/06/2020 through 10/06/2020 Site Technique Total Dose (Gy) Dose per Fx (Gy) Completed Fx Beam Energies  Brain: Brain_pre-op IMRT 16/16 16 1/1 6XFFF    PTV1 Rt Cerebellum 15mm 16Gy 6FFF photons ExacTrac used for each beam angle, 4 VMAT beams Max dose=134.4%   Narrative: The patient tolerated radiation therapy relatively well.   Plan: The patient will proceed with planned surgery and follow-up with radiation oncology in 66mo . -----------------------------------  Eppie Gibson, MD

## 2020-11-05 ENCOUNTER — Telehealth: Payer: Self-pay | Admitting: Oncology

## 2020-11-05 NOTE — Telephone Encounter (Signed)
12/29 spoke with patient about ct scans and dv

## 2020-11-10 ENCOUNTER — Telehealth: Payer: Self-pay | Admitting: *Deleted

## 2020-11-10 NOTE — Telephone Encounter (Signed)
Called patient to ask about having a telemedicine visit instead of coming in on 11-12-20, lvm for a return call

## 2020-11-12 ENCOUNTER — Other Ambulatory Visit: Payer: Self-pay

## 2020-11-12 ENCOUNTER — Ambulatory Visit
Admission: RE | Admit: 2020-11-12 | Discharge: 2020-11-12 | Disposition: A | Discharge status: Home / Self Care | Payer: PRIVATE HEALTH INSURANCE | Source: Ambulatory Visit | Attending: Radiation Oncology | Admitting: Radiation Oncology

## 2020-11-12 DIAGNOSIS — C7931 Secondary malignant neoplasm of brain: Secondary | ICD-10-CM

## 2020-11-12 NOTE — Progress Notes (Signed)
[facility]  [address] [place],  [place]  [address] [phone]  Clinic Day:  11/13/2020  Referring physician: Venetia Maxon, [name], *   This document serves as a record of services personally performed by Hosie Poisson, MD. It was created on their behalf by Curry,Lauren E, a trained medical scribe. The creation of this record is based on the scribe's personal observations and the provider's statements to them.   CHIEF COMPLAINT:  CC:  Recurrent endometrial carcinoma with lung metastasis.  Current Treatment:    Patient is currently on observation.   HISTORY OF PRESENT ILLNESS:  Brianna Herring is a 65 y.o. female with recurrent endometrial carcinoma with lung metastasis.  She was originally diagnosed with stage IB (T1b N0 M0) high-grade endometrial carcinoma in late 2017. She had a robotic hysterectomy and bilateral salpingo-oophorectomy in January 2018.  Pathology revealed a 4 cm, grade 3, endometrioid adenocarcinoma with over 50% invasion of the myometrium and lymphovascular invasion.  Six lymph nodes were negative for metastasis.  Margins were clear.  She received adjuvant external beam radiation, completed in April 2018, followed by vaginal brachytherapy to the vaginal cuff.   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  MSI was high.  A referral to [facility] was ordered in 2018, but the patient never followed through.   CT chest, abdomen and pelvis prior to surgery revealed subtle low-attenuation in the central uterus, as well as small bibasilar pulmonary nodules, which were suspicious for metastatic disease, but only up to 6 mm in size.  PET scan in February 2018 did not reveal hypermetabolic activity in the lung nodules or in the left inguinal node or portacaval node.  There was mild uptake of the pelvic sidewall, which was felt to represent postoperative changes.  The patient was seen in  routine follow-up in March 2019.  Repeat CT chest revealed new pulmonary nodules measuring up to 1.8 cm in the right middle lobe and not in the area of the prior tiny nodules, with a 1.5 cm left lower lobe cavitary nodule.  The previously seen changes of the pelvic sidewall on the prior PET scan had resolved.  Dr. Denman George recommended chemotherapy with carboplatin/paclitaxel for at least 6 cycles, depending on her response, and referred her to [facility] center.  PET in May revealed hypermetabolic activity within the pulmonary nodules consistent with metastatic disease.  There was mild hypermetabolic activity within the portacaval node, which was still felt to be reactive.  When she presented for chemotherapy education on May 29, she was accompanied by her sister and requested a referral to a pulmonologist for a definitive tissue diagnosis.  She saw Dr. Melvyn Novas at [facility] pulmonology, who went over all the risks and benefits of having a bronchoscopy.  The patient decided it was not worth the risk to undergo biopsy and decided to proceed with chemotherapy.  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  We did obtain MLH1 methylation analysis and there was hypermethylation of MLH1, so this does not likely represent Lynch syndrome.   She started palliative carboplatin/paclitaxel in early June 2019 and completed 6 cycles in September.  Repeat CT imaging after 3 cycles of carboplatin/paclitaxel revealed a good response with a decrease in the size and number of pulmonary nodules.  No new areas of metastatic disease were seen.  She  had chronic hematuria and saw Dr. Nila Nephew.  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  She has also had recurrent urinary tract infections, so was placed on D-mannose for prevention.  CT chest, abdomen and pelvis in October 2019 revealed stable to  slightly decreased metastatic lung lesions with no other sites of metastatic disease.  Chemotherapy was discontinued and she was placed on tamoxifen 20 mg daily for maintenance.  Repeat CT scans in January 2020 revealed scattered small bilateral pulmonary nodules that were overall grossly unchanged, including a 10 mm irregular nodule of the right upper lobe and two 6 mm nodules of the right lower lobe.  CT imaging in April was fairly stable with just a slight increase in the right middle lobe pulmonary nodule, which was difficult to assess due to motion artifact.  Unfortunately, CT imaging in August revealed considerable enlargement of the right middle lobe pulmonary nodule from 1 cm to 2.3 cm consistent with progressive pulmonary metastasis.  CT at the time of biopsy revealed the right middle lobe pulmonary nodule to measure approximately 3 cm.  Biopsy of the increased pulmonary nodule confirmed metastatic endometrial adenocarcinoma.  [facility] CDx testing reveals MSI high with an increased TMB (tumor mutational burden) making the patient eligible for immunotherapy.  Tamoxifen was discontinued.  She had hypokalemia despite potassium chloride 10 mEq daily, so this was increased to twice daily.  She was placed on palliative pembrolizumab every 3 weeks and had her 1st cycle on October 1st.  She presented to the emergency department with shortness of breath in November 2020.  CT angio chest revealed small filling defects in the right lower lobe and probably right upper lobe pulmonary arteries consistent with small pulmonary emboli.  The metastatic lesion within the right middle lobe was stable at 3 cm.  There was scattered subsegmental atelectasis.  The patient was hospitalized at that time, as she presented with hypoxia.  She initially was given enoxaparin and then transitioned to rivaroxaban.  Prior to a 4th cycle of pembrolizumab, she had borderline hypokalemia despite potassium chloride 10 mEq twice daily,  so was instructed to increase potassium in her diet.  Her potassium was normal at her visit in December.  After completion of 6 cycles of pembrolizumab, she had progressive disease within the chest.  CT chest, abdomen, and pelvis from February 3rd, 2021 revealed a dramatic increase in the size of the dominant right middle lobe pulmonary nodule, now measuring 5.1 x 5.1 cm, previously measuring 2.4 x 2.3 cm.  The smaller pulmonary nodules in the right lower lobe were stable, but one new pulmonary nodule was seen within the central right middle lobe measuring 14 mm.  There was no evidence of local recurrence of endometrial carcinoma in the pelvis or evidence of metastatic disease in the abdomen or pelvis.  The bladder, kidneys and ureters appeared normal.  In light of these results, her treatment was changed to carboplatin/paclitaxel/bevacizumab.  She had worsening hypokalemia, so was instructed to increase her potassium chloride to 3 times daily.    She had her 1st cycle of carboplatin/paclitaxel/bevacizumab on February 12th, 2021.  She received Neulasta with each cycle to prevent complications of prolonged neutropenia.  She was seen on February 26th to see how she tolerated her 1st cycle.  At that time, she reported a dry cough, felt to be due to her malignancy and was instructed to use Robitussin as needed.  She  Had been seen in the emergency department on February  20th with dyspnea and chest pain radiating around the chest wall, that was cramping in nature.  Chest x-ray did not reveal any acute abnormality.  She was given muscle relaxers and those relieved her pain.  Bevacizumab was held with her 2nd cycle.  She presented to the emergency department with vaginal bleeding on March 16th.  This was felt to be hematuria due to urinary tract infection, but we felt she likely had vaginal bleeding as well.  Rivaroxaban was held during her hospitalization.  She was found to have Klebsiella pneumoniae in the urine and  was discharged on Omnicef.  She had anemia felt to be secondary to blood loss and her hemoglobin dropped to 8.8, but was back up to 9.5 at the time of discharge.  She had tachycardia and an EKG in the emergency department revealed sinus tachycardia with nonspecific changes.  CT chest angiogram did not reveal any evidence of pulmonary embolism.  There was an interval decrease in the pulmonary nodules.  The rivaroxaban was resumed on discharge.  Bevacizumab was permanently discontinued due to bleeding.  She had worsening anemia.  Evaluation did not reveal any nutritional deficiency contributing to her anemia.  We had her see Dr. Denman George as she had not had a vaginal exam in some time.  No vaginal mass or source of bleeding was identified on vaginal examination.  Prior to a 5th cycle of carboplatin/paclitaxel, she had recurrent bleeding, so Dr. Venetia Maxon had held her rivaroxaban.  She was also found to have urinary tract infection with Staphylococcal hemolyticus, which was treated with nitrofurantoin.  We tried resuming rivaroxaban at a lower dose of 15 mg daily, due to her continued risk for thrombosis with metastatic disease.  She was having hematuria/vaginal bleeding and epistaxis, that she attributed to the chemotherapy.  Due to these symptoms, rivaroxaban was discontinued.  She did have a transfusion of 2 units of PRBC's in late May.    CT chest, abdomen and pelvis  in June revealed an excellent response to treatment.  The large right middle lobe mass had resolved, and the right lower lobe pulmonary nodules remain stable at 5 mm.  There was evidence of metastatic disease in the abdomen or pelvis.  She had had right calf tenderness, redness and warmth and was seen by by Dr. Venetia Maxon.  Imaging was negative for DVT.  He felt this represented cellulitis, so treated her with cephalexin 500 mg every 6 hours for 5 days.  We extended that course for an additional 10 days in mid June in view of her immunosuppression.  She had  significant improvement in her hemoglobin in July.  She still had borderline  hypokalemia despite potassium chloride 20 mEq 3 times daily, so we recommended she increase this to 4 times daily.  CT chest on September 20th revealed stable small pulmonary nodules without evidence of metastatic disease within the abdomen and pelvis.  She was doing well at the time, so was scheduled for 3 month follow up with repeat imaging.  The patient presented in November with headache, nausea and vomiting as well as dizziness and weight loss.  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  Pathology confirmed metastatic endometrial adenocarcinoma.  She is doing well postop and they plan a follow up MRI in March in [place].   She had been placed on dexamethasone 4 mg BID, but has been tapered off of that now.  INTERVAL HISTORY:  Brianna Herring is here for routine follow up and states that she feels good.  CT chest, abdomen and pelvis from January 4th revealed a stable exam with no new or progressive disease.  She has been following with Dr. Mickeal Skinner, and underwent preop SRS and craniotomy.  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  MRI is now stable, and they will see her back in March with repeat MRI.  She has been tapered off of oral steroids.  Blood counts and chemistries are unremarkable except for a hemoglobin of 11.4, previously 13.1.  Her  appetite is good, and she has gained 5 pounds since her last visit.  She denies fever, chills or other signs of infection.  She denies nausea, vomiting, bowel issues, or abdominal pain.  She denies sore throat, cough, dyspnea, or chest pain.  REVIEW OF SYSTEMS:  Review of Systems  Constitutional: Negative.   HENT:  Negative.   Eyes: Negative.   Respiratory: Positive for shortness of breath (with exertion).   Cardiovascular: Negative.   Gastrointestinal:  Negative.   Endocrine: Negative.   Genitourinary: Negative.    Musculoskeletal: Negative.  Negative for gait problem.  Skin: Negative.   Neurological: Negative.  Negative for dizziness, gait problem and headaches.  Hematological: Negative.   Psychiatric/Behavioral: Negative.      VITALS:  Blood pressure 138/77, pulse (!) 111, temperature 98.4 F (36.9 C), temperature source Oral, resp. rate 18, height '5\' 6"'  (1.676 m), weight 222 lb 9.6 oz (101 kg), SpO2 98 %.  Wt Readings from Last 3 Encounters:  11/13/20 222 lb 9.6 oz (101 kg)  10/13/20 217 lb 11.2 oz (98.7 kg)  10/08/20 220 lb 0.3 oz (99.8 kg)    Body mass index is 35.93 kg/m.  Performance status (ECOG): 1 - Symptomatic but completely ambulatory  PHYSICAL EXAM:  Physical Exam Constitutional:      General: She is not in acute distress.    Appearance: Normal appearance. She is normal weight.  HENT:     Head: Normocephalic and atraumatic.     Comments: Healing incision of the posterior neck which is clean and doing well. Eyes:     General: No scleral icterus.    Extraocular Movements: Extraocular movements intact.     Conjunctiva/sclera: Conjunctivae normal.     Pupils: Pupils are equal, round, and reactive to light.  Cardiovascular:     Rate and Rhythm: Regular rhythm. Tachycardia present.     Pulses: Normal pulses.     Heart sounds: Normal heart sounds. No murmur heard. No friction rub. No gallop.   Pulmonary:     Effort: Pulmonary effort is normal. No respiratory distress.     Breath sounds: Normal breath sounds.  Abdominal:     General: Bowel sounds are normal. There is no distension.     Palpations: Abdomen is soft. There is no mass.     Tenderness: There is no abdominal tenderness.  Musculoskeletal:        General: Normal range of motion.     Cervical back: Normal range of motion and neck supple.     Right lower leg: No edema.     Left lower leg: No edema.  Lymphadenopathy:     Cervical: No cervical  adenopathy.  Skin:    General: Skin is warm and dry.  Neurological:     General: No focal deficit present.     Mental Status: She is alert and oriented to person, place, and time. Mental status is  at baseline.  Psychiatric:        Mood and Affect: Mood normal.        Behavior: Behavior normal.        Thought Content: Thought content normal.        Judgment: Judgment normal.   Lymph nodes:   There is no cervical, clavicular, axillary or inguinal lymphadenopathy.   LABS:   CBC Latest Ref Rng & Units 10/09/2020 10/08/2020 10/08/2020  WBC 4.0 - 10.5 K/uL 12.0(H) - -  Hemoglobin 12.0 - 15.0 g/dL 11.8(L) 11.6(L) 11.9(L)  Hematocrit 36.0 - 46.0 % 35.2(L) 34.0(L) 35.0(L)  Platelets 150 - 400 K/uL 150 - -   CMP Latest Ref Rng & Units 10/09/2020 10/08/2020 10/08/2020  Glucose 70 - 99 mg/dL 167(H) - -  BUN 8 - 23 mg/dL 7(L) - -  Creatinine 0.44 - 1.00 mg/dL 0.61 - -  Sodium 135 - 145 mmol/L 135 134(L) 137  Potassium 3.5 - 5.1 mmol/L 3.7 3.4(L) 3.3(L)  Chloride 98 - 111 mmol/L 97(L) - -  CO2 22 - 32 mmol/L 22 - -  Calcium 8.9 - 10.3 mg/dL 9.3 - -  Total Protein 6.4 - 8.3 g/dL - - -  Total Bilirubin 0.20 - 1.20 mg/dL - - -  Alkaline Phos 25 - 125 - - -  AST 13 - 35 - - -  ALT 7 - 35 - - -     No results found for: CEA1 / No results found for: CEA1   STUDIES:   She underwent a CT chest, abdomen and pelvis with contrast on 11/12/2019 showing: 1. Stable small bilateral pulmonary nodules. 2. No new or progressive metastatic disease in the chest, abdomen or pelvis. 3. Aortic Atherosclerosis (ICD10-I70.0).  HISTORY:   Allergies: No Known Allergies  Current Medications: Current Outpatient Medications  Medication Sig Dispense Refill  . acetaminophen (TYLENOL) 500 MG tablet Take 500 mg by mouth every 6 (six) hours as needed.    . Calcium Carbonate (CALTRATE 600 PO) Take 1 tablet by mouth in the morning and at bedtime.    . docusate sodium (COLACE) 100 MG capsule Take 1 capsule (100 mg  total) by mouth 2 (two) times daily. 60 capsule 2  . HYDROcodone-acetaminophen (NORCO/VICODIN) 5-325 MG tablet Take 1 tablet by mouth every 4 (four) hours as needed for moderate pain or severe pain. (Patient not taking: Reported on 10/13/2020) 30 tablet 0  . Multiple Vitamin (MULTIVITAMIN WITH MINERALS) TABS tablet Take 1 tablet by mouth daily. Centrum Silver    . potassium chloride SA (KLOR-CON) 20 MEQ tablet Take 60 mEq by mouth 2 (two) times daily.     . Probiotic Product (PROBIOTIC PO) Take 1 capsule by mouth daily.     No current facility-administered medications for this visit.     ASSESSMENT & PLAN:   Assessment: 1. History of stage IB high-grade endometrial carcinoma treated with hysterectomy/bilateral salpingo-oophorectomy, followed by adjuvant radiation and brachytherapy in 2018.  2. Recurrent endometrial carcinoma with metastases to the lung in May 2019 treated with carboplatin/paclitaxel for 6 cycles, followed by maintenance tamoxifen.  She had progression of her disease in August 2020. As her tumor was MSI-high, she was placed on palliative pembrolizumab.  Unfortunately, she had progressive disease after 6 cycles of pembrolizumab.  She was therefore placed on palliative carboplatin/paclitaxel/bevacizumb.  Bevacizumab was discontinued due to persistent vaginal bleeding/hematuria and epistaxis.  She completed 6 cycles of carboplatin/paclitaxel in May 2020. The right lung pulmonary nodules are biopsy proven to represent  metastatic disease.  CT imaging in January 2022 is stable.  3. Pulmonary emboli in February 2021.  She is at increased risk for thrombosis due to her malignancy, but due to recurrent bleeding, we discontinued rivaroxaban.  There was no evidence of pulmonary emboli on the CT in September.  4. Solitary right cerebellar metastasis.   She has followed with Dr. Mickeal Skinner of Neuro-Oncology, and underwent preop SRS and craniotomy.  Pathology was consistent with adenocarcinoma from  the endometrium.  MRI was stable, and she will follow up with them again in March.  She has been tapered off of oral steroids.  Plan: She is doing well following her procedure, and is scheduled with Dr. Mickeal Skinner again in March with repeat MRI.  We will defer to Dr. Marcello Moores to determine when it would be appropriate for her to return to work, but I think she should be able to before the month is over.  We will therefore see her back in 3 months with CBC and CMP for repeat examination.  The patient and her sister understand the plans discussed today and are in agreement with them.  They know to contact our office if they develop other concerns prior to her next appointment.  A total of 20 minutes was spent with the patient today, greater than 50% of which was in face-to-face discussion regarding the assessment and plan.   Derwood Kaplan, MD [facility] [address] [place] [address] Dept: [phone] Dept Fax: [phone]   I, Rita Ohara, am acting as scribe for Derwood Kaplan, MD  I have reviewed this report as typed by the medical scribe, and it is complete and accurate.

## 2020-11-12 NOTE — Progress Notes (Signed)
  Radiation Oncology         [phone]) [phone] ________________________________  Name: Brianna Herring MRN: [id number]  Date: 11/12/2020  DOB: 06/05/1956  Follow-Up Visit Note by telephone.  The patient opted for telemedicine to maximize safety during the pandemic.  MyChart video was not obtainable.  Outpatient  CC: Street, Stephanie Coup, MD  [address], Stephanie Coup, *  Diagnosis and Prior Radiotherapy:    ICD-10-CM   1. Brain metastasis (HCC)  C79.31     Radiation Treatment Dates: 10/06/2020 through 10/06/2020 Site Technique Total Dose (Gy) Dose per Fx (Gy) Completed Fx Beam Energies  Brain: Brain_pre-op IMRT 16/16 16 1/1 6XFFF   CHIEF COMPLAINT: Here for follow-up and surveillance of brain cancer  Narrative:  The patient and I connected by phone today for routine follow-up.  No nausea. Gait is stable. A little tender at surgical site - scalp. HA's occasionally.   She follows up with med/onc this week.                         ALLERGIES:  has No Known Allergies.  Meds: Current Outpatient Medications  Medication Sig Dispense Refill  . acetaminophen (TYLENOL) 500 MG tablet Take 500 mg by mouth every 6 (six) hours as needed.    . Calcium Carbonate (CALTRATE 600 PO) Take 1 tablet by mouth in the morning and at bedtime.    . docusate sodium (COLACE) 100 MG capsule Take 1 capsule (100 mg total) by mouth 2 (two) times daily. 60 capsule 2  . HYDROcodone-acetaminophen (NORCO/VICODIN) 5-325 MG tablet Take 1 tablet by mouth every 4 (four) hours as needed for moderate pain or severe pain. (Patient not taking: Reported on 10/13/2020) 30 tablet 0  . Multiple Vitamin (MULTIVITAMIN WITH MINERALS) TABS tablet Take 1 tablet by mouth daily. Centrum Silver    . potassium chloride SA (KLOR-CON) 20 MEQ tablet Take 60 mEq by mouth 2 (two) times daily.     . Probiotic Product (PROBIOTIC PO) Take 1 capsule by mouth daily.     No current facility-administered medications for this encounter.     Physical Findings: The patient is in no acute distress.    vitals were not taken for this visit. .      Lab Findings: Lab Results  Component Value Date   WBC 12.0 (H) 10/09/2020   HGB 11.8 (L) 10/09/2020   HCT 35.2 (L) 10/09/2020   MCV 87.3 10/09/2020   PLT 150 10/09/2020    Radiographic Findings: No results found.  Impression/Plan:  She reports doing well symptomatically after Brain Radiosurgery and tumor resection.  We will order a Brain MRI in early March and follow-up with Dr. Barbaraann Cao soon thereafter.   We discussed measures to reduce the risk of infection during the COVID-19 pandemic.  She has received her booster shot.  This encounter was provided by telemedicine platform; patient desired telemedicine during pandemic precautions.  MyChart video was not obtainable so telephone was used. The patient has given verbal consent for this type of encounter and has been advised to only accept a meeting of this type in a secure network environment. On date of service, in total, I spent 15 minutes on this encounter.   The attendants for this meeting include Lonie Peak  and Marcelino Freestone During the encounter, Lonie Peak was located at [facility] Radiation Oncology Department.  Marcelino Freestone was located at home.   _____________________________________   Lonie Peak, MD

## 2020-11-13 ENCOUNTER — Other Ambulatory Visit: Payer: PRIVATE HEALTH INSURANCE

## 2020-11-13 ENCOUNTER — Encounter: Payer: Self-pay | Admitting: Oncology

## 2020-11-13 ENCOUNTER — Other Ambulatory Visit: Payer: Self-pay | Admitting: Oncology

## 2020-11-13 ENCOUNTER — Inpatient Hospital Stay: Payer: PRIVATE HEALTH INSURANCE | Attending: Hematology and Oncology | Admitting: Oncology

## 2020-11-13 VITALS — BP 138/77 | HR 111 | Temp 98.4°F | Resp 18 | Ht 66.0 in | Wt 222.6 lb

## 2020-11-13 DIAGNOSIS — C7802 Secondary malignant neoplasm of left lung: Secondary | ICD-10-CM

## 2020-11-13 DIAGNOSIS — C7801 Secondary malignant neoplasm of right lung: Secondary | ICD-10-CM | POA: Diagnosis not present

## 2020-11-13 DIAGNOSIS — C541 Malignant neoplasm of endometrium: Secondary | ICD-10-CM

## 2020-11-17 ENCOUNTER — Encounter: Payer: Self-pay | Admitting: Radiation Oncology

## 2020-11-21 ENCOUNTER — Encounter: Payer: Self-pay | Admitting: Hematology and Oncology

## 2020-12-30 ENCOUNTER — Telehealth: Payer: Self-pay | Admitting: Internal Medicine

## 2020-12-30 NOTE — Telephone Encounter (Signed)
Called patient regarding upcoming 03/07 appointment, patient has been called and voicemail was left.

## 2020-12-31 ENCOUNTER — Other Ambulatory Visit: Payer: Self-pay | Admitting: Hematology and Oncology

## 2020-12-31 DIAGNOSIS — E876 Hypokalemia: Secondary | ICD-10-CM

## 2021-01-09 ENCOUNTER — Other Ambulatory Visit: Payer: Self-pay

## 2021-01-09 ENCOUNTER — Ambulatory Visit
Admission: RE | Admit: 2021-01-09 | Discharge: 2021-01-09 | Disposition: A | Discharge status: Home / Self Care | Payer: PRIVATE HEALTH INSURANCE | Source: Ambulatory Visit | Attending: Internal Medicine | Admitting: Internal Medicine

## 2021-01-09 DIAGNOSIS — C7931 Secondary malignant neoplasm of brain: Secondary | ICD-10-CM

## 2021-01-09 DIAGNOSIS — Z95828 Presence of other vascular implants and grafts: Secondary | ICD-10-CM

## 2021-01-09 MED ORDER — GADOBENATE DIMEGLUMINE 529 MG/ML IV SOLN
20.0000 mL | Freq: Once | INTRAVENOUS | Status: AC | PRN
  Administered 2021-01-09: 20 mL via INTRAVENOUS

## 2021-01-09 MED ORDER — SODIUM CHLORIDE 0.9% FLUSH
10.0000 mL | INTRAVENOUS | Status: DC | PRN
  Administered 2021-01-09: 10 mL via INTRAVENOUS

## 2021-01-09 MED ORDER — HEPARIN SOD (PORK) LOCK FLUSH 100 UNIT/ML IV SOLN
500.0000 [IU] | Freq: Once | INTRAVENOUS | Status: AC
  Administered 2021-01-09: 500 [IU] via INTRAVENOUS

## 2021-01-12 ENCOUNTER — Other Ambulatory Visit: Payer: Self-pay

## 2021-01-12 ENCOUNTER — Inpatient Hospital Stay: Payer: PRIVATE HEALTH INSURANCE | Attending: Hematology and Oncology | Admitting: Internal Medicine

## 2021-01-12 ENCOUNTER — Inpatient Hospital Stay: Payer: PRIVATE HEALTH INSURANCE

## 2021-01-12 VITALS — BP 113/90 | HR 76 | Temp 97.7°F | Resp 18 | Ht 66.0 in

## 2021-01-12 DIAGNOSIS — C541 Malignant neoplasm of endometrium: Secondary | ICD-10-CM | POA: Insufficient documentation

## 2021-01-12 DIAGNOSIS — C7931 Secondary malignant neoplasm of brain: Secondary | ICD-10-CM

## 2021-01-12 NOTE — Progress Notes (Signed)
Brianna Herring at [place], [place] [address] [phone]   Interval Evaluation  Date of Service: 01/12/21 Patient Name: Brianna Herring Patient MRN: [id number] Patient DOB: 01/15/1956 Provider: Ventura Sellers, MD  Identifying Statement:  Brianna Herring is a 65 y.o. female with Brain metastasis [facility]) [C79.31]   Primary Cancer: Endometrial, Stage IV  CNS Oncologic History 10/06/20: Pre-op SRS R cerebellar metastasis Brianna Herring) 10/08/20: Craniotomy, resection Brianna Herring)  Interval History:  Brianna Herring presents today for follow up after recent MRI brain.  She describes no new or progressive deficits.  Back to work full time, up to 60 hours per week.  Continues to have some dizziness upon standing at times.  Denies seizures or headaches.    H+P (10/13/20) Patient presented to medical attention with new onset complaint of headache and gait instability, earlier this month.  Symptoms had been progressive over several weeks, at least.  Headaches were holocranial and occurring in the AM.  Gait issues were non specific, she never needed gait assist aside from "holding on" to family and objects.  Decadron was started by Dr. Hinton Herring, this led to considerable improvement and essentially resolution of clinical complaints.  She is currently dosing 4mg  twice per day.    Medications: Current Outpatient Medications on File Prior to Visit  Medication Sig Dispense Refill  . acetaminophen (TYLENOL) 500 MG tablet Take 500 mg by mouth every 6 (six) hours as needed.    . Calcium Carbonate (CALTRATE 600 PO) Take 1 tablet by mouth in the morning and at bedtime.    . docusate sodium (COLACE) 100 MG capsule Take 1 capsule (100 mg total) by mouth 2 (two) times daily. 60 capsule 2  . Multiple Vitamin (MULTIVITAMIN WITH MINERALS) TABS tablet Take 1 tablet by mouth daily. Centrum Silver    . potassium chloride SA (KLOR-CON) 20 MEQ tablet  TAKE 3 TABLETS BY MOUTH TWICE DAILY 180 tablet 0  . Probiotic Product (PROBIOTIC PO) Take 1 capsule by mouth daily.    Marland Kitchen dexamethasone (DECADRON) 4 MG tablet Take 1 tablet by mouth in the morning and at bedtime.    Marland Kitchen HYDROcodone-acetaminophen (NORCO/VICODIN) 5-325 MG tablet Take 1 tablet by mouth every 4 (four) hours as needed for moderate pain or severe pain. (Patient not taking: No sig reported) 30 tablet 0   No current facility-administered medications on file prior to visit.    Allergies: No Known Allergies Past Medical History:  Past Medical History:  Diagnosis Date  . Cancer [facility])    endometrial  . Headache    patient denies this dx on 10/06/20  . History of radiation therapy 03/02/17-03/16/17   vaginal cuff treated to 18 Gy with 3 fractions of 6 Gy  . Localized swelling of both lower legs    wears compression hose  . Phlebitis alone left leg  4-5 yrs ago   Past Surgical History:  Past Surgical History:  Procedure Laterality Date  . APPLICATION OF CRANIAL NAVIGATION N/A 10/08/2020   Procedure: APPLICATION OF CRANIAL NAVIGATION;  Surgeon: Brianna Mare, MD;  Location: [facility];  Service: Neurosurgery;  Laterality: N/A;  . CRANIOTOMY N/A 10/08/2020   Procedure: CRANIOTOMY - Suboccipital TUMOR EXCISION - Brain Lab;  Surgeon: Brianna Mare, MD;  Location: [facility];  Service: Neurosurgery;  Laterality: N/A;  suboccipital  . ROBOTIC ASSISTED TOTAL HYSTERECTOMY WITH BILATERAL SALPINGO OOPHERECTOMY N/A 11/18/2016   Procedure: XI ROBOTIC ASSISTED TOTAL HYSTERECTOMY WITH BILATERAL SALPINGO  OOPHORECTOMY;  Surgeon: Everitt Amber, MD;  Location: [facility];  Service: Gynecology;  Laterality: N/A;  . SENTINEL NODE BIOPSY N/A 11/18/2016   Procedure: SENTINEL NODE BIOPSY;  Surgeon: Everitt Amber, MD;  Location: [facility];  Service: Gynecology;  Laterality: N/A;   Social History:  Social History   Socioeconomic History  . Marital status: Single    Spouse name: Not on file  . Number of children: 0  .  Years of education: Not on file  . Highest education level: Not on file  Occupational History  . Occupation: sewer  Tobacco Use  . Smoking status: Never Smoker  . Smokeless tobacco: Never Used  Vaping Use  . Vaping Use: Never used  Substance and Sexual Activity  . Alcohol use: No  . Drug use: No  . Sexual activity: Not Currently    Birth control/protection: Surgical    Comment: Hysterectomy  Other Topics Concern  . Not on file  Social History Narrative  . Not on file   Social Determinants of Health   Financial Resource Strain: Not on file  Food Insecurity: Not on file  Transportation Needs: Not on file  Physical Activity: Not on file  Stress: Not on file  Social Connections: Not on file  Intimate Partner Violence: Not on file   Family History:  Family History  Problem Relation Age of Onset  . Lung cancer Father   . Breast cancer Sister     Review of Systems: Constitutional: Doesn't report fevers, chills or abnormal weight loss Eyes: Doesn't report blurriness of vision Ears, nose, mouth, throat, and face: Doesn't report sore throat Respiratory: Doesn't report cough, dyspnea or wheezes Cardiovascular: Doesn't report palpitation, chest discomfort  Gastrointestinal:  Doesn't report nausea, constipation, diarrhea GU: Doesn't report incontinence Skin: Doesn't report skin rashes Neurological: Per HPI Musculoskeletal: Doesn't report joint pain Behavioral/Psych: Doesn't report anxiety  Physical Exam: Vitals:   01/12/21 1111  BP: 113/90  Pulse: 76  Resp: 18  Temp: 97.7 F (36.5 C)  SpO2: 98%   KPS: 80. General: Alert, cooperative, pleasant, in no acute distress Head: Normal EENT: No conjunctival injection or scleral icterus.  Lungs: Resp effort normal Cardiac: Regular rate Abdomen: Non-distended abdomen Skin: No rashes cyanosis or petechiae. Extremities: No clubbing or edema  Neurologic Exam: Mental Status: Awake, alert, attentive to examiner. Oriented to  self and environment. Language is fluent with intact comprehension.  Age advanced psychomotor slowing. Cranial Nerves: Visual acuity is grossly normal. Visual fields are full. Extra-ocular movements intact. No ptosis. Face is symmetric Motor: Tone and bulk are normal. Power is full in both arms and legs. Reflexes are symmetric, no pathologic reflexes present.  Sensory: Intact to light touch Gait: Normal.   Labs: I have reviewed the data as listed    Component Value Date/Time   NA 135 10/09/2020 0626   NA 139 09/19/2020 0000   NA 139 11/03/2016 1023   K 3.7 10/09/2020 0626   K 3.8 11/03/2016 1023   CL 97 (L) 10/09/2020 0626   CO2 22 10/09/2020 0626   CO2 26 11/03/2016 1023   GLUCOSE 167 (H) 10/09/2020 0626   GLUCOSE 134 11/03/2016 1023   BUN 7 (L) 10/09/2020 0626   BUN 17 09/19/2020 0000   BUN 11.6 11/03/2016 1023   CREATININE 0.61 10/09/2020 0626   CREATININE 0.8 11/03/2016 1023   CALCIUM 9.3 10/09/2020 0626   CALCIUM 9.6 11/03/2016 1023   PROT 7.8 11/03/2016 1023   ALBUMIN 4.7 09/19/2020 0000   ALBUMIN  3.5 11/03/2016 1023   AST 38 (A) 09/19/2020 0000   AST 24 11/03/2016 1023   ALT 22 09/19/2020 0000   ALT 17 11/03/2016 1023   ALKPHOS 72 09/19/2020 0000   ALKPHOS 78 11/03/2016 1023   BILITOT 0.62 11/03/2016 1023   GFRNONAA >60 10/09/2020 0626   GFRAA >60 08/13/2018 0236   Lab Results  Component Value Date   WBC 12.0 (H) 10/09/2020   NEUTROABS 4.69 09/19/2020   HGB 11.8 (L) 10/09/2020   HCT 35.2 (L) 10/09/2020   MCV 87.3 10/09/2020   PLT 150 10/09/2020    Imaging:  [facility] Clinician Interpretation: I have personally reviewed the CNS images as listed.  My interpretation, in the context of the patient's clinical presentation, is stable disease  MR BRAIN W WO CONTRAST  Result Date: 01/09/2021 CLINICAL DATA:  Follow-up tumor resection December 2021. Solitary right cerebellar mass. EXAM: MRI HEAD WITHOUT AND WITH CONTRAST TECHNIQUE: Multiplanar, multiecho pulse  sequences of the brain and surrounding structures were obtained without and with intravenous contrast. CONTRAST:  23mL MULTIHANCE GADOBENATE DIMEGLUMINE 529 MG/ML IV SOLN COMPARISON:  10/09/2020.  09/26/2020. FINDINGS: Brain: Cerebral hemispheres again show a background pattern of moderate chronic small-vessel change of the deep and subcortical white matter without evidence of acute or subacute infarction. No sign of metastatic disease to either cerebral hemisphere. Previous right occipital craniotomy for tumor resection. There is enhancement at the site of the resection that is presumed to be benign postoperative enhancement. Patient does not have any significant brain edema. This will serve as a baseline for subsequent follow-up scans. Elsewhere, there is no hydrocephalus or extra-axial fluid collection. The patient has a tendency towards benign dural calcifications. Vascular: Major vessels at the base of the brain show flow. Skull and upper cervical spine: Otherwise negative Sinuses/Orbits: Clear/normal Other: None IMPRESSION: Likely benign postoperative enhancement at the site of right cerebellar tumor resection. No mass effect or edema. This will serve as a baseline for subsequent follow-up. No other new or worrisome brain finding. Moderate chronic small-vessel ischemic changes of the white matter as seen previously. Electronically Signed   By: Nelson Chimes M.D.   On: 01/09/2021 13:35    Assessment/Plan Brain metastasis ([place]) [C79.31]  Brianna Herring is clinically and radiographically stable today.  No new neurologic deficits appreciated today.   No further dexamethasone.  We appreciate the opportunity to participate in the care of Brianna Herring.  We ask that Brianna Herring return to clinic in 3 months following next brain MRI, or sooner as needed.  All questions were answered. The patient knows to call the clinic with any problems, questions or concerns. No barriers  to learning were detected.  The total time spent in the encounter was 30 minutes and more than 50% was on counseling and review of test results   Brianna Sellers, MD Medical Director of Neuro-Oncology [facility] at [place] 01/12/21 11:27 AM

## 2021-01-23 ENCOUNTER — Other Ambulatory Visit: Payer: Self-pay | Admitting: Radiation Therapy

## 2021-02-10 ENCOUNTER — Inpatient Hospital Stay: Payer: PRIVATE HEALTH INSURANCE | Attending: Hematology and Oncology

## 2021-02-10 ENCOUNTER — Other Ambulatory Visit: Payer: Self-pay

## 2021-02-10 ENCOUNTER — Other Ambulatory Visit: Payer: Self-pay | Admitting: Hematology and Oncology

## 2021-02-10 DIAGNOSIS — C541 Malignant neoplasm of endometrium: Secondary | ICD-10-CM

## 2021-02-10 LAB — BASIC METABOLIC PANEL
BUN: 11 (ref 4–21)
CO2: 26 — AB (ref 13–22)
Chloride: 103 (ref 99–108)
Creatinine: 0.6 (ref 0.5–1.1)
Glucose: 151
Potassium: 3.5 (ref 3.4–5.3)
Sodium: 138 (ref 137–147)

## 2021-02-10 LAB — HEPATIC FUNCTION PANEL
ALT: 17 (ref 7–35)
AST: 26 (ref 13–35)
Alkaline Phosphatase: 69 (ref 25–125)
Bilirubin, Total: 0.5

## 2021-02-10 LAB — CBC AND DIFFERENTIAL
HCT: 38 (ref 36–46)
Hemoglobin: 12.5 (ref 12.0–16.0)
Neutrophils Absolute: 2.7
Platelets: 204 (ref 150–399)
WBC: 4.5

## 2021-02-10 LAB — COMPREHENSIVE METABOLIC PANEL
Albumin: 4.4 (ref 3.5–5.0)
Calcium: 9.2 (ref 8.7–10.7)

## 2021-02-10 LAB — CBC
MCV: 90 (ref 81–99)
RBC: 4.23 (ref 3.87–5.11)

## 2021-02-11 ENCOUNTER — Inpatient Hospital Stay: Payer: PRIVATE HEALTH INSURANCE

## 2021-02-11 ENCOUNTER — Inpatient Hospital Stay (INDEPENDENT_AMBULATORY_CARE_PROVIDER_SITE_OTHER): Payer: PRIVATE HEALTH INSURANCE | Admitting: Hematology and Oncology

## 2021-02-11 VITALS — BP 154/82 | HR 88 | Temp 98.6°F | Resp 18 | Ht 66.0 in | Wt 226.0 lb

## 2021-02-11 DIAGNOSIS — E876 Hypokalemia: Secondary | ICD-10-CM

## 2021-02-11 DIAGNOSIS — C541 Malignant neoplasm of endometrium: Secondary | ICD-10-CM | POA: Diagnosis not present

## 2021-02-11 MED ORDER — POTASSIUM CHLORIDE CRYS ER 20 MEQ PO TBCR: 60.0000 meq | EXTENDED_RELEASE_TABLET | Freq: Two times a day (BID) | ORAL | 5 refills | Status: DC

## 2021-02-11 NOTE — Progress Notes (Addendum)
[place]  [address] 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 female with recurrent endometrial carcinoma with lung metastasis.  She was originally diagnosed with stage IB (T1b N0 M0) high-grade endometrial carcinoma in late 2017. She had a robotic hysterectomy and bilateral salpingo-oophorectomy in January 2018.  Pathology revealed a 4 cm, grade 3, endometrioid adenocarcinoma with over 50% invasion of the myometrium and lymphovascular invasion.  Six lymph nodes were negative for metastasis.  Margins were clear.  She received adjuvant external beam radiation, completed in April 2018, followed by vaginal brachytherapy to the vaginal cuff.   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  MSI was high.  A referral to [facility] was ordered in 2018, but the patient never followed through.   CT chest, abdomen and pelvis prior to surgery revealed subtle low-attenuation in the central uterus, as well as small bibasilar pulmonary nodules, which were suspicious for metastatic disease, but only up to 6 mm in size.  PET scan in February 2018 did not reveal hypermetabolic activity in the lung nodules or in the left inguinal node or portacaval node.  There was mild uptake of the pelvic sidewall, which was felt to represent postoperative changes.   The patient was seen in routine follow-up in March 2019.  Repeat CT chest revealed new pulmonary nodules measuring up to 1.8 cm in the right middle lobe and not in the area of the prior tiny nodules, with a 1.5 cm left lower lobe cavitary nodule.  The previously seen changes of the pelvic sidewall on the prior PET scan had  resolved.  PET in May revealed hypermetabolic activity within the pulmonary nodules consistent with metastatic disease.  There was mild hypermetabolic activity within the portacaval node, which was still felt to be reactive.  We recommended 6 cycles of carboplatin and paclitaxel, but when she presented for chemotherapy education on May 29, her sister requested a referral to a pulmonologist for a definitive tissue diagnosis.  Dr. Melvyn Novas at [facility] pulmonology went over all the risks and benefits of having a bronchoscopy.  The patient decided it was not worth the risk to undergo biopsy and decided to proceed with chemotherapy.  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  We did obtain MLH1 methylation analysis and there was hypermethylation of MLH1, so this does not likely represent Lynch syndrome.    She received palliative carboplatin/paclitaxel from June to September 2019, completing 6 cycles.  Repeat CT imaging after 3 cycles of carboplatin/paclitaxel revealed a good response with a decrease in the size and number of pulmonary nodules.  She had chronic hematuria and saw Dr. Nila Nephew, and cystoscopy revealed findings of radiation cystitis.  She has also had recurrent urinary tract infections, so was placed on D-mannose for prevention.  CT chest, abdomen and pelvis in October 2019 revealed stable to slightly decreased metastatic lung lesions with no other sites of metastatic disease.  Chemotherapy was discontinued and she was placed on tamoxifen 20 mg daily for maintenance.  Repeat CT scans in January 2020 revealed scattered small  bilateral pulmonary nodules that were overall grossly unchanged, including a 10 mm irregular nodule of the right upper lobe and two 6 mm nodules of the right lower lobe.  CT imaging in April was fairly stable with just a slight increase in the right middle lobe pulmonary nodule,  which was difficult to assess due to motion artifact.  Unfortunately, CT imaging in August revealed considerable enlargement of the right middle lobe pulmonary nodule from 1 cm to 2.3 cm consistent with progressive pulmonary metastasis.  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  [facility] CDx testing reveals MSI high with an increased TMB (tumor mutational burden) making the patient eligible for immunotherapy. Tamoxifen was discontinued.  She was placed on palliative pembrolizumab every 3 weeks in October 2020.  She presented to the emergency department with shortness of breath in November 2020.  CT angiogram chest revealed small filling defects in the right lower lobe and probably right upper lobe pulmonary arteries consistent with small pulmonary emboli. The metastatic lesion within the right middle lobe was stable at 3 cm.  There was scattered subsegmental atelectasis.  The patient was hospitalized at that time, as she presented with hypoxia.  She initially was given enoxaparin and then transitioned to rivaroxaban.    After completion of 6 cycles of pembrolizumab, she had progressive disease within the chest.  CT chest, abdomen, and pelvis in February 2021 revealed a dramatic increase in the size of the dominant right middle lobe pulmonary nodule, now measuring 5.1 x 5.1 cm, previously measuring 2.4 x 2.3 cm.  The smaller pulmonary nodules in the right lower lobe were stable, but one new pulmonary nodule was seen within the central right middle lobe measuring 14 mm.  There was no evidence of local recurrence of endometrial carcinoma in the pelvis or evidence of metastatic disease in the abdomen or pelvis.  The bladder, kidneys and ureters appeared normal.  In light of these results, her treatment was changed to carboplatin/paclitaxel/bevacizumab  beginning in February. She presented to the emergency department with vaginal bleeding in March.  This was felt to be hematuria due to urinary tract infection, and she was found to have Klebsiella pneumoniae in the urine and was discharged on [place].  She had anemia felt to be secondary to blood loss and her hemoglobin dropped to 8.8, but was back up to 9.5 at the time of discharge. CT chest angiogram did not reveal any evidence of pulmonary embolism.  There was an interval decrease in the pulmonary nodules.  The rivaroxaban was resumed on discharge.  Bevacizumab was permanently discontinued due to continued bleeding.  She saw Dr. Denman George for a vaginal exam in some time.  No vaginal mass or source of bleeding was identified on vaginal examination.  She had recurrent bleeding, so Dr. Venetia Maxon had held her rivaroxaban.  She was found to have another urinary tract infection, this time with Staphylococcal hemolyticus, which was treated with nitrofurantoin.     CT chest, abdomen and pelvis  in June revealed an excellent response to treatment.  The large right middle lobe mass had resolved, and the right lower lobe pulmonary nodules remain stable at 5 mm.  There was no evidence of metastatic disease in the abdomen or pelvis.  She had had right calf tenderness, redness and warmth and was seen by by Dr. Venetia Maxon.  Ultrasound was negative for DVT.  He felt this represented cellulitis, so treated her with cephalexin 500 mg every 6 hours for 5 days.  We extended that course for an additional 10 days in mid June in view of her immunosuppression.  She had significant improvement in her hemoglobin in July.  She still had borderline  hypokalemia despite potassium chloride 20 mEq 3 times daily, so we recommended she increase this to 4 times daily.   CT chest in September revealed stable small pulmonary nodules without evidence of metastatic disease within the abdomen and pelvis.  She was doing well at the time, so was scheduled for  3 month follow up with repeat imaging.  The patient presented in November with headache, nausea and vomiting, as well as dizziness and weight loss.  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 Pathology confirmed metastatic endometrial adenocarcinoma.  She did well postop and was tapered off dexamethasone.   INTERVAL HISTORY:  Violanda is here today for repeat clinical assessment. She saw Dr. Mickeal Skinner and had an MRI brain in March which revealed likely benign postoperative enhancement at the site of the cerebellar tumor resection without other abnormality. She is scheduled to see him again in June with a repeat MRI brain. She denies fevers or chills. She denies pain. Her appetite is good. Her weight has increased 4 pounds over last 3 months . She is back to work full-time.  REVIEW OF SYSTEMS:  Review of Systems  Constitutional: Negative for appetite change, chills, fatigue, fever and unexpected weight change.  HENT:   Negative for lump/mass, mouth sores, sore throat and trouble swallowing.   Respiratory: Negative for cough and shortness of breath.   Cardiovascular: Negative for chest pain and leg swelling.  Gastrointestinal: Negative for abdominal pain, constipation, diarrhea, nausea and vomiting.  Genitourinary: Negative for difficulty urinating, dysuria, frequency and hematuria.   Musculoskeletal: Negative for arthralgias, back pain, gait problem and myalgias.  Skin: Negative for rash.  Neurological: Negative for dizziness, extremity weakness, gait problem, headaches, light-headedness, numbness, seizures and speech difficulty.  Hematological: Negative for adenopathy. Does not bruise/bleed easily.  Psychiatric/Behavioral: Negative for depression and sleep disturbance. The patient is not nervous/anxious.      VITALS:  Blood pressure (!) 154/82, pulse 88, temperature 98.6 F (37 C),  temperature source Oral, resp. rate 18, height $RemoveBe'5\' 6"'JTPcdycrl$  (1.676 m), weight 226 lb (102.5 kg), SpO2 97 %.  Wt Readings from Last 3 Encounters:  02/11/21 226 lb (102.5 kg)  11/13/20 222 lb 9.6 oz (101 kg)  10/13/20 217 lb 11.2 oz (98.7 kg)    Body mass index is 36.48 kg/m.  Performance status (ECOG): 0 - Asymptomatic  PHYSICAL EXAM:  Physical Exam Vitals and nursing note reviewed.  Constitutional:      General: She is not in acute distress.    Appearance: Normal appearance.  HENT:     Head: Normocephalic and atraumatic.     Mouth/Throat:     Mouth: Mucous membranes are moist.     Pharynx: Oropharynx is clear. No oropharyngeal exudate or posterior oropharyngeal erythema.  Eyes:     General: No scleral icterus.    Extraocular Movements: Extraocular movements intact.     Conjunctiva/sclera: Conjunctivae normal.     Pupils: Pupils are equal, round, and reactive to light.  Cardiovascular:     Rate and Rhythm: Normal rate and regular rhythm.     Heart sounds: Normal heart sounds. No murmur  heard. No friction rub. No gallop.   Pulmonary:     Effort: Pulmonary effort is normal.     Breath sounds: Normal breath sounds. No wheezing, rhonchi or rales.  Chest:  Breasts:     Right: No axillary adenopathy or supraclavicular adenopathy.     Left: No axillary adenopathy or supraclavicular adenopathy.    Abdominal:     General: There is no distension.     Palpations: Abdomen is soft. There is no hepatomegaly, splenomegaly or mass.     Tenderness: There is no abdominal tenderness.  Musculoskeletal:        General: Normal range of motion.     Cervical back: Normal range of motion and neck supple. No tenderness.     Right lower leg: No edema.     Left lower leg: No edema.  Lymphadenopathy:     Cervical: No cervical adenopathy.     Upper Body:     Right upper body: No supraclavicular or axillary adenopathy.     Left upper body: No supraclavicular or axillary adenopathy.     Lower Body: No  right inguinal adenopathy. No left inguinal adenopathy.  Skin:    General: Skin is warm and dry.     Coloration: Skin is not jaundiced.     Findings: No rash.  Neurological:     Mental Status: She is alert and oriented to person, place, and time.     Cranial Nerves: No cranial nerve deficit.     Sensory: Sensation is intact.     Motor: Motor function is intact.     Coordination: Finger-Nose-Finger Test normal. Rapid alternating movements normal.  Psychiatric:        Mood and Affect: Mood normal.        Behavior: Behavior normal.        Thought Content: Thought content normal.    LABS:   CBC Latest Ref Rng & Units 02/10/2021 10/09/2020 10/08/2020  WBC - 4.5 12.0(H) -  Hemoglobin 12.0 - 16.0 12.5 11.8(L) 11.6(L)  Hematocrit 36 - 46 38 35.2(L) 34.0(L)  Platelets 150 - 399 204 150 -   CMP Latest Ref Rng & Units 02/10/2021 10/09/2020 10/08/2020  Glucose 70 - 99 mg/dL - 167(H) -  BUN 4 - 21 11 7(L) -  Creatinine 0.5 - 1.1 0.6 0.61 -  Sodium 137 - 147 138 135 134(L)  Potassium 3.4 - 5.3 3.5 3.7 3.4(L)  Chloride 99 - 108 103 97(L) -  CO2 13 - 22 26(A) 22 -  Calcium 8.7 - 10.7 9.2 9.3 -  Total Protein 6.4 - 8.3 g/dL - - -  Total Bilirubin 0.20 - 1.20 mg/dL - - -  Alkaline Phos 25 - 125 69 - -  AST 13 - 35 26 - -  ALT 7 - 35 17 - -     No results found for: CEA1 / No results found for: CEA1 No results found for: PSA1 No results found for: GOT157 No results found for: WIO035  No results found for: TOTALPROTELP, ALBUMINELP, A1GS, A2GS, BETS, BETA2SER, GAMS, MSPIKE, SPEI No results found for: TIBC, FERRITIN, IRONPCTSAT No results found for: LDH  STUDIES:  No results found.  CLINICAL DATA:  Follow-up tumor resection December 2021. Solitary right cerebellar mass.   EXAM: MRI HEAD WITHOUT AND WITH CONTRAST   TECHNIQUE: Multiplanar, multiecho pulse sequences of the brain and surrounding structures were obtained without and with intravenous contrast.   CONTRAST:  33m MULTIHANCE  GADOBENATE DIMEGLUMINE 529 MG/ML IV  SOLN   COMPARISON:  10/09/2020.  09/26/2020.   FINDINGS: Brain: Cerebral hemispheres again show a background pattern of moderate chronic small-vessel change of the deep and subcortical white matter without evidence of acute or subacute infarction. No sign of metastatic disease to either cerebral hemisphere.   Previous right occipital craniotomy for tumor resection. There is enhancement at the site of the resection that is presumed to be benign postoperative enhancement. Patient does not have any significant brain edema. This will serve as a baseline for subsequent follow-up scans.   Elsewhere, there is no hydrocephalus or extra-axial fluid collection. The patient has a tendency towards benign dural calcifications.   Vascular: Major vessels at the base of the brain show flow.   Skull and upper cervical spine: Otherwise negative   Sinuses/Orbits: Clear/normal   Other: None   IMPRESSION: Likely benign postoperative enhancement at the site of right cerebellar tumor resection. No mass effect or edema. This will serve as a baseline for subsequent follow-up. No other new or worrisome brain finding.   Moderate chronic small-vessel ischemic changes of the white matter as seen previously.   HISTORY:   Past Medical History:  Diagnosis Date   Cancer [facility])    endometrial   Headache    patient denies this dx on 10/06/20   History of radiation therapy 03/02/17-03/16/17   vaginal cuff treated to 18 Gy with 3 fractions of 6 Gy   Localized swelling of both lower legs    wears compression hose   Phlebitis alone left leg  4-5 yrs ago    Past Surgical History:  Procedure Laterality Date   APPLICATION OF CRANIAL NAVIGATION N/A 10/08/2020   Procedure: APPLICATION OF CRANIAL NAVIGATION;  Surgeon: Vallarie Mare, MD;  Location: [facility];  Service: Neurosurgery;  Laterality: N/A;   CRANIOTOMY N/A 10/08/2020   Procedure: CRANIOTOMY - Suboccipital TUMOR  EXCISION - Brain Lab;  Surgeon: Vallarie Mare, MD;  Location: [facility];  Service: Neurosurgery;  Laterality: N/A;  suboccipital   ROBOTIC ASSISTED TOTAL HYSTERECTOMY WITH BILATERAL SALPINGO OOPHERECTOMY N/A 11/18/2016   Procedure: XI ROBOTIC ASSISTED TOTAL HYSTERECTOMY WITH BILATERAL SALPINGO OOPHORECTOMY;  Surgeon: Everitt Amber, MD;  Location: [facility];  Service: Gynecology;  Laterality: N/A;   SENTINEL NODE BIOPSY N/A 11/18/2016   Procedure: SENTINEL NODE BIOPSY;  Surgeon: Everitt Amber, MD;  Location: [facility];  Service: Gynecology;  Laterality: N/A;    Family History  Problem Relation Age of Onset   Lung cancer Father    Breast cancer Sister     Social History:  reports that she has never smoked. She has never used smokeless tobacco. She reports that she does not drink alcohol and does not use drugs.The patient is accompanied by her sister today.  Allergies: No Known Allergies  Current Medications: Current Outpatient Medications  Medication Sig Dispense Refill   acetaminophen (TYLENOL) 500 MG tablet Take 500 mg by mouth every 6 (six) hours as needed.     Calcium Carbonate (CALTRATE 600 PO) Take 1 tablet by mouth in the morning and at bedtime.     dexamethasone (DECADRON) 4 MG tablet Take 1 tablet by mouth in the morning and at bedtime.     docusate sodium (COLACE) 100 MG capsule Take 1 capsule (100 mg total) by mouth 2 (two) times daily. 60 capsule 2   HYDROcodone-acetaminophen (NORCO/VICODIN) 5-325 MG tablet Take 1 tablet by mouth every 4 (four) hours as needed for moderate pain or severe pain. (Patient not taking: No sig reported)  30 tablet 0   Multiple Vitamin (MULTIVITAMIN WITH MINERALS) TABS tablet Take 1 tablet by mouth daily. Centrum Silver     potassium chloride SA (KLOR-CON) 20 MEQ tablet Take 3 tablets (60 mEq total) by mouth 2 (two) times daily. 180 tablet 5   Probiotic Product (PROBIOTIC PO) Take 1 capsule by mouth daily.     No current facility-administered medications for this  visit.     ASSESSMENT & PLAN:   Assessment: 1. History of stage IB high-grade endometrial carcinoma treated with hysterectomy/bilateral salpingo-oophorectomy, followed by adjuvant radiation and brachytherapy in 2018.   2. 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  She had progression of her disease in August 2020. As her tumor was MSI-high, she was placed on palliative pembrolizumab. Unfortunately, she had progressive disease after 6 cycles of pembrolizumab.  She was therefore placed on palliative carboplatin/paclitaxel/bevacizumb. Bevacizumab was discontinued due to persistent vaginal bleeding/hematuria and epistaxis.  She completed 6 cycles of carboplatin/paclitaxel in May 2020. CT imaging in January 2022 is stable.   3. Pulmonary emboli in February 2021.  She is at increased risk for thrombosis due to her malignancy, but due to recurrent bleeding, we discontinued rivaroxaban .  She has not had evidence of recurrent thrombosis.  4. Solitary right cerebellar metastasis, status post preoperative stereotactic radio surgery followed by surgical resection.   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  She continues to follow with Dr. Mickeal Skinner of Neuro-Oncology.  MRI in March did not reveal any evidence of recurrence.  She is scheduled to see him again in June with repeat MRI.   Plan:  We will plan to see her back in 2 months with a CBC, comprehensive metabolic panel and CT chest, abdomen and pelvis to reassess her disease baseline around the time of her next MRI brain. The patient understands the plans discussed today and is in agreement with them.  She knows to contact our office if she develops concerns prior to her next appointment.       [name], PA-C

## 2021-02-12 ENCOUNTER — Encounter: Payer: Self-pay | Admitting: Hematology and Oncology

## 2021-04-10 ENCOUNTER — Ambulatory Visit
Admission: RE | Admit: 2021-04-10 | Discharge: 2021-04-10 | Disposition: A | Discharge status: Home / Self Care | Payer: Medicare Other | Source: Ambulatory Visit | Attending: Internal Medicine | Admitting: Internal Medicine

## 2021-04-10 DIAGNOSIS — C7931 Secondary malignant neoplasm of brain: Secondary | ICD-10-CM

## 2021-04-10 MED ORDER — GADOBENATE DIMEGLUMINE 529 MG/ML IV SOLN
20.0000 mL | Freq: Once | INTRAVENOUS | Status: AC | PRN
  Administered 2021-04-10: 20 mL via INTRAVENOUS

## 2021-04-14 ENCOUNTER — Other Ambulatory Visit: Payer: Self-pay

## 2021-04-14 ENCOUNTER — Inpatient Hospital Stay: Payer: Medicare Other | Attending: Hematology and Oncology | Admitting: Internal Medicine

## 2021-04-14 VITALS — BP 132/76 | HR 102 | Temp 97.9°F | Resp 19 | Wt 225.3 lb

## 2021-04-14 DIAGNOSIS — C7931 Secondary malignant neoplasm of brain: Secondary | ICD-10-CM | POA: Diagnosis present

## 2021-04-14 DIAGNOSIS — C541 Malignant neoplasm of endometrium: Secondary | ICD-10-CM | POA: Diagnosis not present

## 2021-04-14 NOTE — Progress Notes (Signed)
[place] at [place], [place] [address] [phone]   Interval Evaluation  Date of Service: 04/14/21 Patient Name: Brianna Herring Patient MRN: [id number] Patient DOB: Apr 13, 1956 Provider: Ventura Sellers, MD  Identifying Statement:  Brianna Herring is a 65 y.o. female with Brain metastasis [facility]) [C79.31]   Primary Cancer: Endometrial, Stage IV  CNS Oncologic History 10/06/20: Pre-op SRS R cerebellar metastasis Brianna Herring) 10/08/20: Craniotomy, resection Brianna Herring)  Interval History:  Brianna Herring presents today for follow up after recent MRI brain.  She describes no new or progressive deficits.  She remains active and continues to work full time.  Continues to have some dizziness upon standing at times.  Denies seizures or headaches.    H+P (10/13/20) Patient presented to medical attention with new onset complaint of headache and gait instability, earlier this month.  Symptoms had been progressive over several weeks, at least.  Headaches were holocranial and occurring in the AM.  Gait issues were non specific, she never needed gait assist aside from "holding on" to family and objects.  Decadron was started by Dr. Hinton Rao, this led to considerable improvement and essentially resolution of clinical complaints.  She is currently dosing 4mg  twice per day.    Medications: Current Outpatient Medications on File Prior to Visit  Medication Sig Dispense Refill  . acetaminophen (TYLENOL) 500 MG tablet Take 500 mg by mouth every 6 (six) hours as needed.    . Calcium Carbonate (CALTRATE 600 PO) Take 1 tablet by mouth in the morning and at bedtime.    Marland Kitchen dexamethasone (DECADRON) 4 MG tablet Take 1 tablet by mouth in the morning and at bedtime.    . docusate sodium (COLACE) 100 MG capsule Take 1 capsule (100 mg total) by mouth 2 (two) times daily. 60 capsule 2  . HYDROcodone-acetaminophen (NORCO/VICODIN) 5-325 MG  tablet Take 1 tablet by mouth every 4 (four) hours as needed for moderate pain or severe pain. (Patient not taking: No sig reported) 30 tablet 0  . Multiple Vitamin (MULTIVITAMIN WITH MINERALS) TABS tablet Take 1 tablet by mouth daily. Centrum Silver    . potassium chloride SA (KLOR-CON) 20 MEQ tablet Take 3 tablets (60 mEq total) by mouth 2 (two) times daily. 180 tablet 5  . Probiotic Product (PROBIOTIC PO) Take 1 capsule by mouth daily.     No current facility-administered medications on file prior to visit.    Allergies: No Known Allergies Past Medical History:  Past Medical History:  Diagnosis Date  . Cancer [name])    endometrial  . Headache    patient denies this dx on 10/06/20  . History of radiation therapy 03/02/17-03/16/17   vaginal cuff treated to 18 Gy with 3 fractions of 6 Gy  . Localized swelling of both lower legs    wears compression hose  . Phlebitis alone left leg  4-5 yrs ago   Past Surgical History:  Past Surgical History:  Procedure Laterality Date  . APPLICATION OF CRANIAL NAVIGATION N/A 10/08/2020   Procedure: APPLICATION OF CRANIAL NAVIGATION;  Surgeon: Vallarie Mare, MD;  Location: [place];  Service: Neurosurgery;  Laterality: N/A;  . CRANIOTOMY N/A 10/08/2020   Procedure: CRANIOTOMY - Suboccipital TUMOR EXCISION - Brain Lab;  Surgeon: Vallarie Mare, MD;  Location: [place];  Service: Neurosurgery;  Laterality: N/A;  suboccipital  . ROBOTIC ASSISTED TOTAL HYSTERECTOMY WITH BILATERAL SALPINGO OOPHERECTOMY N/A 11/18/2016   Procedure: XI ROBOTIC ASSISTED TOTAL HYSTERECTOMY  WITH BILATERAL SALPINGO OOPHORECTOMY;  Surgeon: Everitt Amber, MD;  Location: [facility];  Service: Gynecology;  Laterality: N/A;  . SENTINEL NODE BIOPSY N/A 11/18/2016   Procedure: SENTINEL NODE BIOPSY;  Surgeon: Everitt Amber, MD;  Location: [facility];  Service: Gynecology;  Laterality: N/A;   Social History:  Social History   Socioeconomic History  . Marital status: Single    Spouse name: Not on file   . Number of children: 0  . Years of education: Not on file  . Highest education level: Not on file  Occupational History  . Occupation: sewer  Tobacco Use  . Smoking status: Never Smoker  . Smokeless tobacco: Never Used  Vaping Use  . Vaping Use: Never used  Substance and Sexual Activity  . Alcohol use: No  . Drug use: No  . Sexual activity: Not Currently    Birth control/protection: Surgical    Comment: Hysterectomy  Other Topics Concern  . Not on file  Social History Narrative  . Not on file   Social Determinants of Health   Financial Resource Strain: Not on file  Food Insecurity: Not on file  Transportation Needs: Not on file  Physical Activity: Not on file  Stress: Not on file  Social Connections: Not on file  Intimate Partner Violence: Not on file   Family History:  Family History  Problem Relation Age of Onset  . Lung cancer Father   . Breast cancer Sister     Review of Systems: Constitutional: Doesn't report fevers, chills or abnormal weight loss Eyes: Doesn't report blurriness of vision Ears, nose, mouth, throat, and face: Doesn't report sore throat Respiratory: Doesn't report cough, dyspnea or wheezes Cardiovascular: Doesn't report palpitation, chest discomfort  Gastrointestinal:  Doesn't report nausea, constipation, diarrhea GU: Doesn't report incontinence Skin: Doesn't report skin rashes Neurological: Per HPI Musculoskeletal: Doesn't report joint pain Behavioral/Psych: Doesn't report anxiety  Physical Exam: Vitals:   04/14/21 1121  BP: 132/76  Pulse: (!) 102  Resp: 19  Temp: 97.9 F (36.6 C)   KPS: 80. General: Alert, cooperative, pleasant, in no acute distress Head: Normal EENT: No conjunctival injection or scleral icterus.  Lungs: Resp effort normal Cardiac: Regular rate Abdomen: Non-distended abdomen Skin: No rashes cyanosis or petechiae. Extremities: No clubbing or edema  Neurologic Exam: Mental Status: Awake, alert, attentive to  examiner. Oriented to self and environment. Language is fluent with intact comprehension.  Age advanced psychomotor slowing. Cranial Nerves: Visual acuity is grossly normal. Visual fields are full. Extra-ocular movements intact. No ptosis. Face is symmetric Motor: Tone and bulk are normal. Power is full in both arms and legs. Reflexes are symmetric, no pathologic reflexes present.  Sensory: Intact to light touch Gait: Normal.   Labs: I have reviewed the data as listed    Component Value Date/Time   NA 138 02/10/2021 0000   NA 139 11/03/2016 1023   K 3.5 02/10/2021 0000   K 3.8 11/03/2016 1023   CL 103 02/10/2021 0000   CO2 26 (A) 02/10/2021 0000   CO2 26 11/03/2016 1023   GLUCOSE 167 (H) 10/09/2020 0626   GLUCOSE 134 11/03/2016 1023   BUN 11 02/10/2021 0000   BUN 11.6 11/03/2016 1023   CREATININE 0.6 02/10/2021 0000   CREATININE 0.61 10/09/2020 0626   CREATININE 0.8 11/03/2016 1023   CALCIUM 9.2 02/10/2021 0000   CALCIUM 9.6 11/03/2016 1023   PROT 7.8 11/03/2016 1023   ALBUMIN 4.4 02/10/2021 0000   ALBUMIN 3.5 11/03/2016 1023   AST  26 02/10/2021 0000   AST 24 11/03/2016 1023   ALT 17 02/10/2021 0000   ALT 17 11/03/2016 1023   ALKPHOS 69 02/10/2021 0000   ALKPHOS 78 11/03/2016 1023   BILITOT 0.62 11/03/2016 1023   GFRNONAA >60 10/09/2020 0626   GFRAA >60 08/13/2018 0236   Lab Results  Component Value Date   WBC 4.5 02/10/2021   NEUTROABS 2.70 02/10/2021   HGB 12.5 02/10/2021   HCT 38 02/10/2021   MCV 90 02/10/2021   PLT 204 02/10/2021    Imaging:  [place] Clinician Interpretation: I have personally reviewed the CNS images as listed.  My interpretation, in the context of the patient's clinical presentation, is stable disease  MR BRAIN W WO CONTRAST  Result Date: 04/11/2021 CLINICAL DATA:  Stereotactic radio surgery. Follow-up of metastatic disease to the brain. EXAM: MRI HEAD WITHOUT AND WITH CONTRAST TECHNIQUE: Multiplanar, multiecho pulse sequences of the brain and  surrounding structures were obtained without and with intravenous contrast. CONTRAST:  20mL MULTIHANCE GADOBENATE DIMEGLUMINE 529 MG/ML IV SOLN COMPARISON:  01/09/2021 FINDINGS: Brain: No acute infarct, mass effect or extra-axial collection. No acute or chronic hemorrhage. There is multifocal hyperintense T2-weighted signal within the white matter. Generalized volume loss without a clear lobar predilection. The midline structures are normal. Decreased area contrast enhancement in the medial inferior right cerebellum. Vascular: Major flow voids are preserved. Skull and upper cervical spine: Prior right suboccipital craniotomy. Sinuses/Orbits:No paranasal sinus fluid levels or advanced mucosal thickening. No mastoid or middle ear effusion. Normal orbits. IMPRESSION: 1. No new intracranial metastatic disease. 2. Decreased area of contrast enhancement in the medial inferior right cerebellum, at the site of treated lesion. Electronically Signed   By: Ulyses Jarred M.D.   On: 04/11/2021 00:14    Assessment/Plan Brain metastasis ([place]) [C79.31]  Brianna Herring is clinically and radiographically stable today.  No new neurologic deficits appreciated today.   We encouraged physical activity, healthy dietary choices, positive mental outlook.  We appreciate the opportunity to participate in the care of Brianna Herring.  We ask that Brianna Herring return to clinic in 3 months following next brain MRI, or sooner as needed.  All questions were answered. The patient knows to call the clinic with any problems, questions or concerns. No barriers to learning were detected.  The total time spent in the encounter was 30 minutes and more than 50% was on counseling and review of test results   Ventura Sellers, MD Medical Director of Neuro-Oncology [facility] at [place] 04/14/21 11:08 AM

## 2021-06-30 ENCOUNTER — Other Ambulatory Visit: Payer: Self-pay | Admitting: Radiation Therapy

## 2021-07-03 ENCOUNTER — Telehealth: Payer: Self-pay | Admitting: Internal Medicine

## 2021-07-03 NOTE — Telephone Encounter (Signed)
Called patient regarding upcoming appointments, patient has been called and notified. 

## 2021-07-17 ENCOUNTER — Ambulatory Visit
Admission: RE | Admit: 2021-07-17 | Discharge: 2021-07-17 | Disposition: A | Discharge status: Home / Self Care | Payer: Medicare Other | Source: Ambulatory Visit | Attending: Internal Medicine | Admitting: Internal Medicine

## 2021-07-17 DIAGNOSIS — C7931 Secondary malignant neoplasm of brain: Secondary | ICD-10-CM

## 2021-07-17 MED ORDER — GADOBENATE DIMEGLUMINE 529 MG/ML IV SOLN
20.0000 mL | Freq: Once | INTRAVENOUS | Status: AC | PRN
  Administered 2021-07-17: 20 mL via INTRAVENOUS

## 2021-07-23 ENCOUNTER — Inpatient Hospital Stay: Payer: Medicare Other | Attending: Internal Medicine | Admitting: Internal Medicine

## 2021-07-23 ENCOUNTER — Other Ambulatory Visit: Payer: Self-pay

## 2021-07-23 VITALS — BP 142/70 | HR 99 | Temp 98.1°F | Resp 16 | Ht 66.0 in | Wt 226.0 lb

## 2021-07-23 DIAGNOSIS — C7931 Secondary malignant neoplasm of brain: Secondary | ICD-10-CM

## 2021-07-23 DIAGNOSIS — C541 Malignant neoplasm of endometrium: Secondary | ICD-10-CM | POA: Insufficient documentation

## 2021-07-23 NOTE — Progress Notes (Signed)
[place] at [place], [place] [address] [phone]   Interval Evaluation  Date of Service: 07/23/21 Patient Name: Brianna Herring Patient MRN: [id number] Patient DOB: [birth year] Provider: Ventura Sellers, MD  Identifying Statement:  Brianna Herring is a 65 y.o. female with Brain metastasis [facility]) [C79.31]   Primary Cancer: Endometrial, Stage IV  CNS Oncologic History 10/06/20: Pre-op SRS R cerebellar metastasis Brianna Herring) 10/08/20: Craniotomy, resection [facility])  Interval History:  Brianna Herring presents today for follow up after recent MRI brain.  She denies new or progressive changes today.  She remains active and continues to work full time.  Continues to have some dizziness upon standing at times.  Denies seizures or headaches.    H+P (10/13/20) Patient presented to medical attention with new onset complaint of headache and gait instability, earlier this month.  Symptoms had been progressive over several weeks, at least.  Headaches were holocranial and occurring in the AM.  Gait issues were non specific, she never needed gait assist aside from "holding on" to family and objects.  Decadron was started by Brianna Herring, this led to considerable improvement and essentially resolution of clinical complaints.  She is currently dosing '4mg'$  twice per day.    Medications: Current Outpatient Medications on File Prior to Visit  Medication Sig Dispense Refill   acetaminophen (TYLENOL) 500 MG tablet Take 500 mg by mouth every 6 (six) hours as needed.     Calcium Carbonate (CALTRATE 600 PO) Take 1 tablet by mouth in the morning and at bedtime.     Multiple Vitamin (MULTIVITAMIN WITH MINERALS) TABS tablet Take 1 tablet by mouth daily. Centrum Silver     potassium chloride SA (KLOR-CON) 20 MEQ tablet Take 3 tablets (60 mEq total) by mouth 2 (two) times daily. 180 tablet 5   Probiotic Product (PROBIOTIC PO) Take 1  capsule by mouth daily.     No current facility-administered medications on file prior to visit.    Allergies: No Known Allergies Past Medical History:  Past Medical History:  Diagnosis Date   Cancer ([place])    endometrial   Headache    patient denies this dx on 10/06/20   History of radiation therapy 03/02/17-03/16/17   vaginal cuff treated to 18 Gy with 3 fractions of 6 Gy   Localized swelling of both lower legs    wears compression hose   Phlebitis alone left leg  4-5 yrs ago   Past Surgical History:  Past Surgical History:  Procedure Laterality Date   APPLICATION OF CRANIAL NAVIGATION N/A 10/08/2020   Procedure: APPLICATION OF CRANIAL NAVIGATION;  Surgeon: Vallarie Mare, MD;  Location: [place];  Service: Neurosurgery;  Laterality: N/A;   CRANIOTOMY N/A 10/08/2020   Procedure: CRANIOTOMY - Suboccipital TUMOR EXCISION - Brain Lab;  Surgeon: Vallarie Mare, MD;  Location: [place];  Service: Neurosurgery;  Laterality: N/A;  suboccipital   ROBOTIC ASSISTED TOTAL HYSTERECTOMY WITH BILATERAL SALPINGO OOPHERECTOMY N/A 11/18/2016   Procedure: XI ROBOTIC ASSISTED TOTAL HYSTERECTOMY WITH BILATERAL SALPINGO OOPHORECTOMY;  Surgeon: Everitt Amber, MD;  [facility];  Service: Gynecology;  Laterality: N/A;   SENTINEL NODE BIOPSY N/A 11/18/2016   Procedure: SENTINEL NODE BIOPSY;  Surgeon: Everitt Amber, MD;  [facility];  Service: Gynecology;  Laterality: N/A;   Social History:  Social History   Socioeconomic History   Marital status: Single    Spouse name: Not on file   Number of children:  0   Years of education: Not on file   Highest education level: Not on file  Occupational History   Occupation: sewer  Tobacco Use   Smoking status: Never   Smokeless tobacco: Never  Vaping Use   Vaping Use: Never used  Substance and Sexual Activity   Alcohol use: No   Drug use: No   Sexual activity: Not Currently    Birth control/protection: Surgical    Comment: Hysterectomy  Other Topics  Concern   Not on file  Social History Narrative   Not on file   Social Determinants of Health   Financial Resource Strain: Not on file  Food Insecurity: Not on file  Transportation Needs: Not on file  Physical Activity: Not on file  Stress: Not on file  Social Connections: Not on file  Intimate Partner Violence: Not on file   Family History:  Family History  Problem Relation Age of Onset   Lung cancer Father    Breast cancer Sister     Review of Systems: Constitutional: Doesn't report fevers, chills or abnormal weight loss Eyes: Doesn't report blurriness of vision Ears, nose, mouth, throat, and face: Doesn't report sore throat Respiratory: Doesn't report cough, dyspnea or wheezes Cardiovascular: Doesn't report palpitation, chest discomfort  Gastrointestinal:  Doesn't report nausea, constipation, diarrhea GU: Doesn't report incontinence Skin: Doesn't report skin rashes Neurological: Per HPI Musculoskeletal: Doesn't report joint pain Behavioral/Psych: Doesn't report anxiety  Physical Exam: Vitals:   07/23/21 1206  BP: (!) 142/70  Pulse: 99  Resp: 16  Temp: 98.1 F (36.7 C)  SpO2: 96%   KPS: 80. General: Alert, cooperative, pleasant, in no acute distress Head: Normal EENT: No conjunctival injection or scleral icterus.  Lungs: Resp effort normal Cardiac: Regular rate Abdomen: Non-distended abdomen Skin: No rashes cyanosis or petechiae. Extremities: No clubbing or edema  Neurologic Exam: Mental Status: Awake, alert, attentive to examiner. Oriented to self and environment. Language is fluent with intact comprehension.  Age advanced psychomotor slowing. Cranial Nerves: Visual acuity is grossly normal. Visual fields are full. Extra-ocular movements intact. No ptosis. Face is symmetric Motor: Tone and bulk are normal. Power is full in both arms and legs. Reflexes are symmetric, no pathologic reflexes present.  Sensory: Intact to light touch Gait:  Normal.   Labs: I have reviewed the data as listed    Component Value Date/Time   NA 138 02/10/2021 0000   NA 139 11/03/2016 1023   K 3.5 02/10/2021 0000   K 3.8 11/03/2016 1023   CL 103 02/10/2021 0000   CO2 26 (A) 02/10/2021 0000   CO2 26 11/03/2016 1023   GLUCOSE 167 (H) 10/09/2020 0626   GLUCOSE 134 11/03/2016 1023   BUN 11 02/10/2021 0000   BUN 11.6 11/03/2016 1023   CREATININE 0.6 02/10/2021 0000   CREATININE 0.61 10/09/2020 0626   CREATININE 0.8 11/03/2016 1023   CALCIUM 9.2 02/10/2021 0000   CALCIUM 9.6 11/03/2016 1023   PROT 7.8 11/03/2016 1023   ALBUMIN 4.4 02/10/2021 0000   ALBUMIN 3.5 11/03/2016 1023   AST 26 02/10/2021 0000   AST 24 11/03/2016 1023   ALT 17 02/10/2021 0000   ALT 17 11/03/2016 1023   ALKPHOS 69 02/10/2021 0000   ALKPHOS 78 11/03/2016 1023   BILITOT 0.62 11/03/2016 1023   GFRNONAA >60 10/09/2020 0626   GFRAA >60 08/13/2018 0236   Lab Results  Component Value Date   WBC 4.5 02/10/2021   NEUTROABS 2.70 02/10/2021   HGB 12.5 02/10/2021  HCT 38 02/10/2021   MCV 90 02/10/2021   PLT 204 02/10/2021    Imaging:  [place] Clinician Interpretation: I have personally reviewed the CNS images as listed.  My interpretation, in the context of the patient's clinical presentation, is stable disease  MR BRAIN W WO CONTRAST  Result Date: 07/19/2021 CLINICAL DATA:  Brain metastasis. Brain/CNS neoplasm, assess treatment response. Additional history provided: Metastatic endometrial cancer, stereotactic radiosurgery, tumor resection 2021. EXAM: MRI HEAD WITHOUT AND WITH CONTRAST TECHNIQUE: Multiplanar, multiecho pulse sequences of the brain and surrounding structures were obtained without and with intravenous contrast. CONTRAST:  58m MULTIHANCE GADOBENATE DIMEGLUMINE 529 MG/ML IV SOLN COMPARISON:  Prior brain MRI examinations 04/10/2021 and earlier. FINDINGS: Brain: Mild generalized cerebral and cerebellar atrophy, stable. Redemonstrated sequela of prior right  suboccipital craniotomy for tumor resection. Unchanged encephalomalacia/gliosis within the underlying inferomedial right cerebellar hemisphere. Ill-defined foci of enhancement at, and adjacent to, the resection site are unchanged from the brain MRI of 04/10/2021. Suspected mild chronic hemosiderin deposition at the resection cavity. No new intracranial metastatic lesion is identified. Moderate multifocal T2/FLAIR hyperintensity within the cerebral white matter, nonspecific but compatible with chronic small vessel ischemic disease. Redemonstrated small chronic lacunar infarct within the callosal body. There is no acute infarct. No extra-axial fluid collection. No midline shift. Vascular: Maintained flow voids within the proximal large arterial vessels. Skull and upper cervical spine: Focal suspicious marrow lesion. Prior right suboccipital craniotomy. Incompletely assessed cervical spondylosis. Sinuses/Orbits: Visualized orbits show no acute finding. Small right maxillary sinus mucous retention cyst. Trace mucosal thickening within the bilateral ethmoid air cells. IMPRESSION: Stable post-surgical and post-treatment appearance of the right cerebellar hemisphere as compared to the prior brain MRI of 04/10/2021. No new intracranial metastatic lesion is identified. Electronically Signed   By: KKellie SimmeringD.O.   On: 07/19/2021 18:31     Assessment/Plan Brain metastasis ([place] [C79.31]  SPenni Bombardis clinically and radiographically stable today.  We don't appreciate any progressive neurologic changes.  We encouraged physical activity, healthy dietary choices, positive mental outlook.  We appreciate the opportunity to participate in the care of SClaryce Herring  We ask that SZuha Griecoreturn to clinic in 3 months following next brain MRI, or sooner as needed.  All questions were answered. The patient knows to call the clinic with any problems, questions or concerns. No  barriers to learning were detected.  The total time spent in the encounter was 30 minutes and more than 50% was on counseling and review of test results   ZVentura Sellers MD Medical Director of Neuro-Oncology [facility]at [place]09/15/22 2:39 PM

## 2021-07-24 ENCOUNTER — Telehealth: Payer: Self-pay | Admitting: Internal Medicine

## 2021-07-24 NOTE — Telephone Encounter (Signed)
Scheduled appt per 9/15 los. Called pt, no answer. Left msg with appt date and time.

## 2021-08-07 ENCOUNTER — Other Ambulatory Visit: Payer: Self-pay | Admitting: Radiation Therapy

## 2021-08-24 ENCOUNTER — Other Ambulatory Visit: Payer: Self-pay | Admitting: Hematology and Oncology

## 2021-08-24 DIAGNOSIS — C541 Malignant neoplasm of endometrium: Secondary | ICD-10-CM

## 2021-08-24 DIAGNOSIS — C7801 Secondary malignant neoplasm of right lung: Secondary | ICD-10-CM

## 2021-08-24 DIAGNOSIS — C7802 Secondary malignant neoplasm of left lung: Secondary | ICD-10-CM

## 2021-09-03 NOTE — Progress Notes (Signed)
[facility]  [address] [place],  [place]  [address] [phone]  Clinic Day:  09/09/2021  Referring physician: Venetia Maxon, [name], *  This document serves as a record of services personally performed by Hosie Poisson, MD. It was created on their behalf by Curry,Lauren E, a trained medical scribe. The creation of this record is based on the scribe's personal observations and the provider's statements to them.  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 female with recurrent endometrial carcinoma with lung metastasis.  She was originally diagnosed with stage IB (T1b N0 M0) high-grade endometrial carcinoma in late 2017. She had a robotic hysterectomy and bilateral salpingo-oophorectomy in January 2018.  Pathology revealed a 4 cm, grade 3, endometrioid adenocarcinoma with over 50% invasion of the myometrium and lymphovascular invasion.  Six lymph nodes were negative for metastasis.  Margins were clear.  She received adjuvant external beam radiation, completed in April 2018, followed by vaginal brachytherapy to the vaginal cuff.   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  MSI was high.  A referral to [facility] was ordered in 2018, but the patient never followed through.   CT chest, abdomen and pelvis prior to surgery revealed subtle low-attenuation in the central uterus, as well as small bibasilar pulmonary nodules, which were suspicious for metastatic disease, but only up to 6 mm in size.  PET scan in February 2018 did not reveal hypermetabolic activity in the lung nodules or in the left inguinal node or portacaval node.  There was mild uptake of the pelvic sidewall, which was felt to represent postoperative changes.   The patient was seen in routine follow-up in  March 2019.  Repeat CT chest revealed new pulmonary nodules measuring up to 1.8 cm in the right middle lobe and not in the area of the prior tiny nodules, with a 1.5 cm left lower lobe cavitary nodule.  The previously seen changes of the pelvic sidewall on the prior PET scan had resolved.  PET in May revealed hypermetabolic activity within the pulmonary nodules consistent with metastatic disease.  There was mild hypermetabolic activity within the portacaval node, which was still felt to be reactive.  We recommended 6 cycles of carboplatin and paclitaxel, but when she presented for chemotherapy education on May 29, her sister requested a referral to a pulmonologist for a definitive tissue diagnosis.  Dr. Melvyn Novas at [facility] pulmonology went over all the risks and benefits of having a bronchoscopy.  The patient decided it was not worth the risk to undergo biopsy and decided to proceed with chemotherapy.  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  We did obtain MLH1 methylation analysis and there was hypermethylation of MLH1, so this does not likely represent Lynch syndrome.    She received palliative carboplatin/paclitaxel from June to September 2019, completing 6 cycles.  Repeat CT imaging after 3 cycles of carboplatin/paclitaxel revealed a good response with a decrease in the size and number of pulmonary nodules.  She had chronic hematuria and saw Dr. Nila Nephew, and cystoscopy revealed findings of radiation cystitis.  She has also had recurrent urinary tract infections, so was placed on D-mannose for prevention.  CT chest, abdomen  and pelvis in October 2019 revealed stable to slightly decreased metastatic lung lesions with no other sites of metastatic disease.  Chemotherapy was discontinued and she was placed on tamoxifen 20 mg daily for maintenance.  Repeat CT scans in January 2020 revealed scattered small  bilateral pulmonary nodules that were overall grossly unchanged, including a 10 mm irregular nodule of the right upper lobe and two 6 mm nodules of the right lower lobe.  CT imaging in April was fairly stable with just a slight increase in the right middle lobe pulmonary nodule, which was difficult to assess due to motion artifact.  Unfortunately, CT imaging in August revealed considerable enlargement of the right middle lobe pulmonary nodule from 1 cm to 2.3 cm consistent with progressive pulmonary metastasis.  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  [facility] CDx testing reveals MSI high with an increased TMB (tumor mutational burden) making the patient eligible for immunotherapy. Tamoxifen was discontinued.  She was placed on palliative pembrolizumab every 3 weeks in October 2020.  She presented to the emergency department with shortness of breath in November 2020.  CT angiogram chest revealed small filling defects in the right lower lobe and probably right upper lobe pulmonary arteries consistent with small pulmonary emboli. The metastatic lesion within the right middle lobe was stable at 3 cm.  There was scattered subsegmental atelectasis.  The patient was hospitalized at that time, as she presented with hypoxia.  She initially was given enoxaparin and then transitioned to rivaroxaban.    After completion of 6 cycles of pembrolizumab, she had progressive disease within the chest.  CT chest, abdomen, and pelvis in February 2021 revealed a dramatic increase in the size of the dominant right middle lobe pulmonary nodule, now measuring 5.1 x 5.1 cm, previously measuring 2.4 x 2.3 cm.  The smaller pulmonary nodules in the right lower lobe were stable, but one new pulmonary nodule was seen within the central right middle lobe measuring 14 mm.  There was  no evidence of local recurrence of endometrial carcinoma in the pelvis or evidence of metastatic disease in the abdomen or pelvis.  The bladder, kidneys and ureters appeared normal.  In light of these results, her treatment was changed to carboplatin/paclitaxel/bevacizumab beginning in February. She presented to the emergency department with vaginal bleeding in March.  This was felt to be hematuria due to urinary tract infection, and she was found to have Klebsiella pneumoniae in the urine and was discharged on [place].  She had anemia felt to be secondary to blood loss and her hemoglobin dropped to 8.8, but was back up to 9.5 at the time of discharge. CT chest angiogram did not reveal any evidence of pulmonary embolism.  There was an interval decrease in the pulmonary nodules.  The rivaroxaban was resumed on discharge.  Bevacizumab was permanently discontinued due to continued bleeding.  She saw Dr. Denman George for a vaginal exam in some time.  No vaginal mass or source of bleeding was identified on vaginal examination.  She had recurrent bleeding, so Dr. Venetia Maxon had held her rivaroxaban.  She was found to have another urinary tract infection, this time with Staphylococcal hemolyticus, which was treated with nitrofurantoin.     CT chest, abdomen and pelvis  in June revealed an excellent response to treatment.  The large right middle lobe mass had resolved, and the right  lower lobe pulmonary nodules remain stable at 5 mm.  There was no evidence of metastatic disease in the abdomen or pelvis.  She had had right calf tenderness, redness and warmth and was seen by by Dr. Venetia Maxon.  Ultrasound was negative for DVT.  He felt this represented cellulitis, so treated her with cephalexin 500 mg every 6 hours for 5 days.  We extended that course for an additional 10 days in mid June in view of her immunosuppression.  She had significant improvement in her hemoglobin in July.  She still had borderline  hypokalemia despite potassium  chloride 20 mEq 3 times daily, so we recommended she increase this to 4 times daily.   CT chest in September 2021 revealed stable small pulmonary nodules without evidence of metastatic disease within the abdomen and pelvis.  She was doing well at the time, so was scheduled for 3 month follow up with repeat imaging.  The patient presented in November 2021 with headache, nausea and vomiting, as well as dizziness and weight loss.  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 Pathology confirmed metastatic endometrial adenocarcinoma.  She did well postop and was tapered off dexamethasone.  INTERVAL HISTORY:  Brianna Herring is here for routine follow up and states that she has been well and denies complaints today. She continues to follow with Dr. Mickeal Skinner and MRI brain from September revealed stable post-surgical and post-treatment appearance of the right cerebellar hemisphere. No new intracranial metastatic lesion is identified. CT chest, abdomen and pelvis from October 28th revealed no new evidence of metastatic disease in the chest, abdomen or pelvis. Stable 5 mm right lower lobe pulmonary nodules. Right thyroid nodule stable with respect imaged portions less than 1.5 cm on previous imaging. Mild hepatic steatosis. Blood counts and chemistries are unremarkable. CA 125 was normal at 6.9. She continues oral potassium supplement 6 times daily, and I advised that she continue this as it is barely normal. I will refill this today. Her  appetite is good, and she has lost 2 1/2 pounds since her last visit.  She denies fever, chills or other signs of infection.  She denies nausea, vomiting, bowel issues, or abdominal pain.  She denies sore throat, cough, dyspnea, or chest pain.  REVIEW OF SYSTEMS:  Review of Systems  Constitutional: Negative.  Negative for appetite change, chills, fatigue, fever and unexpected weight  change.  HENT:  Negative.    Eyes: Negative.   Respiratory: Negative.  Negative for chest tightness, cough, hemoptysis, shortness of breath and wheezing.   Cardiovascular: Negative.  Negative for chest pain, leg swelling and palpitations.  Gastrointestinal: Negative.  Negative for abdominal distention, abdominal pain, blood in stool, constipation, diarrhea, nausea and vomiting.  Endocrine: Negative.   Genitourinary: Negative.  Negative for difficulty urinating, dysuria, frequency and hematuria.   Musculoskeletal: Negative.  Negative for arthralgias, back pain, flank pain, gait problem and myalgias.  Skin: Negative.   Neurological: Negative.  Negative for dizziness, extremity weakness, gait problem, headaches, light-headedness, numbness, seizures and speech difficulty.  Hematological: Negative.   Psychiatric/Behavioral: Negative.  Negative for depression and sleep disturbance. The patient is not nervous/anxious.     VITALS:  Blood pressure 139/66, pulse 100, temperature 98 F (36.7 C), temperature source Oral, resp. rate 20, height '5\' 6"'  (1.676 m), weight 223 lb 11.2 oz (101.5 kg), SpO2 100 %.  Wt Readings from Last 3 Encounters:  09/09/21 223 lb  11.2 oz (101.5 kg)  07/23/21 226 lb (102.5 kg)  04/14/21 225 lb 4.8 oz (102.2 kg)    Body mass index is 36.11 kg/m.  Performance status (ECOG): 0 - Asymptomatic  PHYSICAL EXAM:  Physical Exam Constitutional:      General: She is not in acute distress.    Appearance: Normal appearance. She is normal weight.  HENT:     Head: Normocephalic and atraumatic.  Eyes:     General: No scleral icterus.    Extraocular Movements: Extraocular movements intact.     Conjunctiva/sclera: Conjunctivae normal.     Pupils: Pupils are equal, round, and reactive to light.  Cardiovascular:     Rate and Rhythm: Normal rate and regular rhythm.     Pulses: Normal pulses.     Heart sounds: Normal heart sounds. No murmur heard.   No friction rub. No gallop.   Pulmonary:     Effort: Pulmonary effort is normal. No respiratory distress.     Breath sounds: Normal breath sounds.  Abdominal:     General: Bowel sounds are normal. There is no distension.     Palpations: Abdomen is soft. There is no hepatomegaly, splenomegaly or mass.     Tenderness: There is no abdominal tenderness.  Musculoskeletal:        General: Normal range of motion.     Cervical back: Normal range of motion and neck supple.     Right lower leg: No edema.     Left lower leg: No edema.  Lymphadenopathy:     Cervical: No cervical adenopathy.  Skin:    General: Skin is warm and dry.  Neurological:     General: No focal deficit present.     Mental Status: She is alert and oriented to person, place, and time. Mental status is at baseline.  Psychiatric:        Mood and Affect: Mood normal.        Behavior: Behavior normal.        Thought Content: Thought content normal.        Judgment: Judgment normal.   LABS:   CBC Latest Ref Rng & Units 09/04/2021 02/10/2021 10/09/2020  WBC - 5.1 4.5 12.0(H)  Hemoglobin 12.0 - 16.0 12.6 12.5 11.8(L)  Hematocrit 36 - 46 38 38 35.2(L)  Platelets 150 - 399 216 204 150   CMP Latest Ref Rng & Units 09/04/2021 02/10/2021 10/09/2020  Glucose 70 - 99 mg/dL - - 167(H)  BUN 4 - '21 13 11 ' 7(L)  Creatinine 0.5 - 1.1 0.7 0.6 0.61  Sodium 137 - 147 141 138 135  Potassium 3.4 - 5.3 3.5 3.5 3.7  Chloride 99 - 108 103 103 97(L)  CO2 13 - 22 30(A) 26(A) 22  Calcium 8.7 - 10.7 8.6(A) 9.2 9.3  Total Protein 6.4 - 8.3 g/dL - - -  Total Bilirubin 0.20 - 1.20 mg/dL - - -  Alkaline Phos 25 - 125 99 69 -  AST 13 - 35 28 26 -  ALT 7 - 35 15 17 -    No results found for: [id number]    STUDIES:  No results found.   EXAM: 07/17/2021 MRI HEAD WITHOUT AND WITH CONTRAST   TECHNIQUE: Multiplanar, multiecho pulse sequences of the brain and surrounding structures were obtained without and with intravenous contrast.   CONTRAST:  16m MULTIHANCE GADOBENATE  DIMEGLUMINE 529 MG/ML IV SOLN   COMPARISON:  Prior brain MRI examinations 04/10/2021 and earlier.   FINDINGS: Brain:  Mild generalized cerebral and cerebellar atrophy, stable.   Redemonstrated sequela of prior right suboccipital craniotomy for tumor resection. Unchanged encephalomalacia/gliosis within the underlying inferomedial right cerebellar hemisphere. Ill-defined foci of enhancement at, and adjacent to, the resection site are unchanged from the brain MRI of 04/10/2021. Suspected mild chronic hemosiderin deposition at the resection cavity.   No new intracranial metastatic lesion is identified.   Moderate multifocal T2/FLAIR hyperintensity within the cerebral white matter, nonspecific but compatible with chronic small vessel ischemic disease. Redemonstrated small chronic lacunar infarct within the callosal body.   There is no acute infarct.   No extra-axial fluid collection.   No midline shift.   Vascular: Maintained flow voids within the proximal large arterial vessels.   Skull and upper cervical spine: Focal suspicious marrow lesion. Prior right suboccipital craniotomy. Incompletely assessed cervical spondylosis.   Sinuses/Orbits: Visualized orbits show no acute finding. Small right maxillary sinus mucous retention cyst. Trace mucosal thickening within the bilateral ethmoid air cells.   IMPRESSION: Stable post-surgical and post-treatment appearance of the right cerebellar hemisphere as compared to the prior brain MRI of 04/10/2021.   No new intracranial metastatic lesion is identified.  EXAM: 09/04/2021 CT CHEST, ABDOMEN, AND PELVIS WITH CONTRAST  TECHNIQUE: Multidetector CT imaging of the chest, abdomen and pelvis was performed following the standard protocol during bolus administration of intravenous contrast.  CONTRAST: 100 cc Isovue 370  COMPARISON: November 11, 2020.  FINDINGS: CT CHEST FINDINGS Cardiovascular: Calcified and noncalcified  atheromatous plaque in the thoracic aorta. RIGHT-sided Port-A-Cath terminates in the RIGHT atrium. The central pulmonary vasculature is unremarkable on venous phase assessment. Mediastinum/Nodes: No adenopathy in the chest. 1 cm low-attenuation nodule in the RIGHT thyroid incompletely imaged, stable with respect to imaged portions. No adenopathy in the chest. Mildly patulous esophagus. Lungs/Pleura: Stable 5 mm RIGHT lower lobe pulmonary nodules (image 57/4) (image 46/4) No new pulmonary nodules. No effusion. No consolidative process. Airways are patent. Musculoskeletal: See below for full musculoskeletal details.  CT ABDOMEN PELVIS FINDINGS Hepatobiliary: No focal, suspicious hepatic lesion. No pericholecystic stranding. No biliary duct dilation. Portal vein is patent. Mild hepatic steatosis. Pancreas: Normal, without mass, inflammation or ductal dilatation. Spleen: Normal spleen. Adrenals/Urinary Tract: Adrenal glands are normal. Symmetric renal enhancement. No hydronephrosis. Smooth contour of a collapsed urinary bladder without signs of adjacent stranding. 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 Vascular/Lymphatic: Aorta with smooth contours. IVC with smooth contours. No aneurysmal dilation of the abdominal aorta. There is no gastrohepatic or hepatoduodenal ligament lymphadenopathy. No retroperitoneal or mesenteric lymphadenopathy. No pelvic sidewall lymphadenopathy. Reproductive: Post hysterectomy. No adnexal or pelvic mass. Or cysts Other: No ascites. Musculoskeletal: No acute bone finding. No destructive bone process. Spinal degenerative changes.  IMPRESSION: No new evidence of metastatic disease in the chest, abdomen or pelvis. Stable 5 mm RIGHT lower lobe pulmonary nodules. RIGHT thyroid nodule stable with respect imaged portions less than 1.5 cm on previous  imaging. No dedicated follow-up recommended based on current guidelines. Mild hepatic steatosis. Aortic Atherosclerosis (ICD10-I70.0).  HISTORY:   Allergies: Not on File  Current Medications: Current Outpatient Medications  Medication Sig Dispense Refill   acetaminophen (TYLENOL) 500 MG tablet Take 500 mg by mouth every 6 (six) hours as needed.     Calcium Carbonate (CALTRATE 600 PO) Take 1 tablet by mouth in the morning and at bedtime.     Multiple Vitamin (MULTIVITAMIN WITH MINERALS) TABS tablet Take 1  tablet by mouth daily. Centrum Silver     potassium chloride SA (KLOR-CON) 20 MEQ tablet Take 3 tablets (60 mEq total) by mouth 2 (two) times daily. 180 tablet 5   Probiotic Product (PROBIOTIC PO) Take 1 capsule by mouth daily.     No current facility-administered medications for this visit.     ASSESSMENT & PLAN:   Assessment: 1. History of stage IB high-grade endometrial carcinoma treated with hysterectomy/bilateral salpingo-oophorectomy, followed by adjuvant radiation and brachytherapy in 2018.   2. 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  She had progression of her disease in August 2020. As her tumor was MSI-high, she was placed on palliative pembrolizumab. Unfortunately, she had progressive disease after 6 cycles of pembrolizumab.  She was therefore placed on palliative carboplatin/paclitaxel/bevacizumb. Bevacizumab was discontinued due to persistent vaginal bleeding/hematuria and epistaxis.  She completed 6 cycles of carboplatin/paclitaxel in May 2020. CT imaging in October 2022 remains stable.   3. Pulmonary emboli in February 2021.  She is at increased risk for thrombosis due to her malignancy, but due to recurrent bleeding, we discontinued rivaroxaban .  She has not had evidence of recurrent thrombosis.  4. Solitary right cerebellar metastasis,  status post preoperative stereotactic radio surgery followed by surgical resection.   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  She continues to follow with Dr. Mickeal Skinner of Neuro[facility]Oncology.  MRI in September revealed stable post-surgical and post-treatment appearance of the right cerebellar hemisphere. No new intracranial metastatic lesion is identified.   Plan:   She will follow up with Dr. Mickeal Skinner again in December with repeat MRI. As she continues to do well, we will plan to see her back in 3 months with a CBC, comprehensive metabolic panel CA 423. CT imaging will be repeated again in 6 months. The patient understands the plans discussed today and is in agreement with them.  She knows to contact our office if she develops concerns prior to her next appointment.  I provided 15 minutes of face-to-face time during this this encounter and > 50% was spent counseling as documented under my assessment and plan.    I, Rita Ohara, am acting as scribe for Derwood Kaplan, MD  I have reviewed this report as typed by the medical scribe, and it is complete and accurate.

## 2021-09-04 ENCOUNTER — Encounter: Payer: Self-pay | Admitting: Hematology and Oncology

## 2021-09-04 LAB — CBC: RBC: 4.21 (ref 3.87–5.11)

## 2021-09-04 LAB — CBC AND DIFFERENTIAL
HCT: 38 (ref 36–46)
Hemoglobin: 12.6 (ref 12.0–16.0)
Neutrophils Absolute: 3.21
Platelets: 216 (ref 150–399)
WBC: 5.1

## 2021-09-04 LAB — BASIC METABOLIC PANEL
BUN: 13 (ref 4–21)
CO2: 30 — AB (ref 13–22)
Chloride: 103 (ref 99–108)
Creatinine: 0.7 (ref 0.5–1.1)
Glucose: 138
Potassium: 3.5 (ref 3.4–5.3)
Sodium: 141 (ref 137–147)

## 2021-09-04 LAB — HEPATIC FUNCTION PANEL
ALT: 15 (ref 7–35)
AST: 28 (ref 13–35)
Alkaline Phosphatase: 99 (ref 25–125)
Bilirubin, Total: 0.5

## 2021-09-04 LAB — COMPREHENSIVE METABOLIC PANEL
Albumin: 4 (ref 3.5–5.0)
Calcium: 8.6 — AB (ref 8.7–10.7)

## 2021-09-04 LAB — CA 125: CA125: 6.9

## 2021-09-07 ENCOUNTER — Telehealth: Payer: Self-pay | Admitting: Oncology

## 2021-09-07 NOTE — Telephone Encounter (Signed)
Patient notified on 10/17 of scheduled 10/28 Labs, CT Scan - 11/2 Follow Up.  Informed patient she would need to Pick Up Contrast for Scans/Instructions

## 2021-09-08 ENCOUNTER — Encounter: Payer: Self-pay | Admitting: Oncology

## 2021-09-09 ENCOUNTER — Other Ambulatory Visit: Payer: Self-pay | Admitting: Oncology

## 2021-09-09 ENCOUNTER — Encounter: Payer: Self-pay | Admitting: Oncology

## 2021-09-09 ENCOUNTER — Inpatient Hospital Stay: Payer: Medicare Other | Attending: Internal Medicine | Admitting: Oncology

## 2021-09-09 ENCOUNTER — Telehealth: Payer: Self-pay | Admitting: Oncology

## 2021-09-09 VITALS — BP 139/66 | HR 100 | Temp 98.0°F | Resp 20 | Ht 66.0 in | Wt 223.7 lb

## 2021-09-09 DIAGNOSIS — C7931 Secondary malignant neoplasm of brain: Secondary | ICD-10-CM | POA: Diagnosis not present

## 2021-09-09 DIAGNOSIS — E876 Hypokalemia: Secondary | ICD-10-CM

## 2021-09-09 DIAGNOSIS — C541 Malignant neoplasm of endometrium: Secondary | ICD-10-CM | POA: Diagnosis not present

## 2021-09-09 DIAGNOSIS — C7801 Secondary malignant neoplasm of right lung: Secondary | ICD-10-CM

## 2021-09-09 DIAGNOSIS — C7802 Secondary malignant neoplasm of left lung: Secondary | ICD-10-CM

## 2021-09-09 MED ORDER — POTASSIUM CHLORIDE CRYS ER 20 MEQ PO TBCR: 60.0000 meq | EXTENDED_RELEASE_TABLET | Freq: Two times a day (BID) | ORAL | 5 refills | Status: DC

## 2021-09-09 NOTE — Telephone Encounter (Signed)
Per 11/2 los next appt scheduled and given to patient 

## 2021-10-19 ENCOUNTER — Ambulatory Visit
Admission: RE | Admit: 2021-10-19 | Discharge: 2021-10-19 | Disposition: A | Discharge status: Home / Self Care | Payer: Medicare Other | Source: Ambulatory Visit | Attending: Internal Medicine | Admitting: Internal Medicine

## 2021-10-19 DIAGNOSIS — C7931 Secondary malignant neoplasm of brain: Secondary | ICD-10-CM

## 2021-10-19 MED ORDER — GADOBENATE DIMEGLUMINE 529 MG/ML IV SOLN
20.0000 mL | Freq: Once | INTRAVENOUS | Status: AC | PRN
  Administered 2021-10-19: 20 mL via INTRAVENOUS

## 2021-10-26 ENCOUNTER — Inpatient Hospital Stay: Payer: Medicare Other | Attending: Internal Medicine | Admitting: Internal Medicine

## 2021-10-26 ENCOUNTER — Other Ambulatory Visit: Payer: Self-pay

## 2021-10-26 VITALS — BP 140/74 | HR 90 | Temp 97.9°F | Resp 18 | Ht 66.0 in | Wt 224.3 lb

## 2021-10-26 DIAGNOSIS — C541 Malignant neoplasm of endometrium: Secondary | ICD-10-CM | POA: Insufficient documentation

## 2021-10-26 DIAGNOSIS — C7800 Secondary malignant neoplasm of unspecified lung: Secondary | ICD-10-CM | POA: Insufficient documentation

## 2021-10-26 DIAGNOSIS — C7931 Secondary malignant neoplasm of brain: Secondary | ICD-10-CM | POA: Insufficient documentation

## 2021-10-26 NOTE — Progress Notes (Signed)
[place] [facility] [place], [place] [address] [phone]   Interval Evaluation  Date of Service: 10/26/21 Patient Name: Brianna Herring Patient MRN: [id number] Patient DOB: 1956/06/08 Provider: Ventura Sellers, MD  Identifying Statement:  Brianna Herring is a 65 y.o. female with Brain metastasis [facility]) [C79.31]   Primary Cancer: Endometrial, Stage IV  CNS Oncologic History 10/06/20: Pre-op SRS R cerebellar metastasis Brianna Herring) 10/08/20: Craniotomy, resection Brianna Herring)  Interval History:  Brianna Herring presents today for follow up after recent MRI brain.  She denies new or progressive changes today.  She remains active and continues to work full time.  Continues to have some dizziness upon standing at times.  Denies seizures or headaches.    H+P (10/13/20) Patient presented to medical attention with new onset complaint of headache and gait instability, earlier this month.  Symptoms had been progressive over several weeks, at least.  Headaches were holocranial and occurring in the AM.  Gait issues were non specific, she never needed gait assist aside from "holding on" to family and objects.  Decadron was started by Dr. Hinton Rao, this led to considerable improvement and essentially resolution of clinical complaints.  She is currently dosing 4mg  twice per day.    Medications: Current Outpatient Medications on File Prior to Visit  Medication Sig Dispense Refill   acetaminophen (TYLENOL) 500 MG tablet Take 500 mg by mouth every 6 (six) hours as needed.     Calcium Carbonate (CALTRATE 600 PO) Take 1 tablet by mouth in the morning and at bedtime.     Multiple Vitamin (MULTIVITAMIN WITH MINERALS) TABS tablet Take 1 tablet by mouth daily. Centrum Silver     potassium chloride SA (KLOR-CON) 20 MEQ tablet Take 3 tablets (60 mEq total) by mouth 2 (two) times daily. 180 tablet 5   Probiotic Product (PROBIOTIC PO) Take 1  capsule by mouth daily.     No current facility-administered medications on file prior to visit.    Allergies: Not on File Past Medical History:  Past Medical History:  Diagnosis Date   Cancer [facility])    endometrial   Headache    patient denies this dx on 10/06/20   History of radiation therapy 03/02/17-03/16/17   vaginal cuff treated to 18 Gy with 3 fractions of 6 Gy   Localized swelling of both lower legs    wears compression hose   Phlebitis alone left leg  4-5 yrs ago   Past Surgical History:  Past Surgical History:  Procedure Laterality Date   APPLICATION OF CRANIAL NAVIGATION N/A 10/08/2020   Procedure: APPLICATION OF CRANIAL NAVIGATION;  Surgeon: Vallarie Mare, MD;  Location: [place];  Service: Neurosurgery;  Laterality: N/A;   CRANIOTOMY N/A 10/08/2020   Procedure: CRANIOTOMY - Suboccipital TUMOR EXCISION - Brain Lab;  Surgeon: Vallarie Mare, MD;  Location: [place];  Service: Neurosurgery;  Laterality: N/A;  suboccipital   ROBOTIC ASSISTED TOTAL HYSTERECTOMY WITH BILATERAL SALPINGO OOPHERECTOMY N/A 11/18/2016   Procedure: XI ROBOTIC ASSISTED TOTAL HYSTERECTOMY WITH BILATERAL SALPINGO OOPHORECTOMY;  Surgeon: Everitt Amber, MD;  Location: [facility];  Service: Gynecology;  Laterality: N/A;   SENTINEL NODE BIOPSY N/A 11/18/2016   Procedure: SENTINEL NODE BIOPSY;  Surgeon: Everitt Amber, MD;  Location: [facility];  Service: Gynecology;  Laterality: N/A;   Social History:  Social History   Socioeconomic History   Marital status: Single    Spouse name: Not on file   Number of children:  0   Years of education: Not on file   Highest education level: Not on file  Occupational History   Occupation: sewer  Tobacco Use   Smoking status: Never   Smokeless tobacco: Never  Vaping Use   Vaping Use: Never used  Substance and Sexual Activity   Alcohol use: No   Drug use: No   Sexual activity: Not Currently    Birth control/protection: Surgical    Comment: Hysterectomy  Other Topics Concern    Not on file  Social History Narrative   Not on file   Social Determinants of Health   Financial Resource Strain: Not on file  Food Insecurity: Not on file  Transportation Needs: Not on file  Physical Activity: Not on file  Stress: Not on file  Social Connections: Not on file  Intimate Partner Violence: Not on file   Family History:  Family History  Problem Relation Age of Onset   Lung cancer Father    Breast cancer Sister     Review of Systems: Constitutional: Doesn't report fevers, chills or abnormal weight loss Eyes: Doesn't report blurriness of vision Ears, nose, mouth, throat, and face: Doesn't report sore throat Respiratory: Doesn't report cough, dyspnea or wheezes Cardiovascular: Doesn't report palpitation, chest discomfort  Gastrointestinal:  Doesn't report nausea, constipation, diarrhea GU: Doesn't report incontinence Skin: Doesn't report skin rashes Neurological: Per HPI Musculoskeletal: Doesn't report joint pain Behavioral/Psych: Doesn't report anxiety  Physical Exam: Vitals:   10/26/21 1233  BP: 140/74  Pulse: 90  Resp: 18  Temp: 97.9 F (36.6 C)  SpO2: 98%   KPS: 80. General: Alert, cooperative, pleasant, in no acute distress Head: Normal EENT: No conjunctival injection or scleral icterus.  Lungs: Resp effort normal Cardiac: Regular rate Abdomen: Non-distended abdomen Skin: No rashes cyanosis or petechiae. Extremities: No clubbing or edema  Neurologic Exam: Mental Status: Awake, alert, attentive to examiner. Oriented to self and environment. Language is fluent with intact comprehension.  Age advanced psychomotor slowing. Cranial Nerves: Visual acuity is grossly normal. Visual fields are full. Extra-ocular movements intact. No ptosis. Face is symmetric Motor: Tone and bulk are normal. Power is full in both arms and legs. Reflexes are symmetric, no pathologic reflexes present.  Sensory: Intact to light touch Gait: Normal.   Labs: I have  reviewed the data as listed    Component Value Date/Time   NA 141 09/04/2021 0000   NA 139 11/03/2016 1023   K 3.5 09/04/2021 0000   K 3.8 11/03/2016 1023   CL 103 09/04/2021 0000   CO2 30 (A) 09/04/2021 0000   CO2 26 11/03/2016 1023   GLUCOSE 167 (H) 10/09/2020 0626   GLUCOSE 134 11/03/2016 1023   BUN 13 09/04/2021 0000   BUN 11.6 11/03/2016 1023   CREATININE 0.7 09/04/2021 0000   CREATININE 0.61 10/09/2020 0626   CREATININE 0.8 11/03/2016 1023   CALCIUM 8.6 (A) 09/04/2021 0000   CALCIUM 9.6 11/03/2016 1023   PROT 7.8 11/03/2016 1023   ALBUMIN 4.0 09/04/2021 0000   ALBUMIN 3.5 11/03/2016 1023   AST 28 09/04/2021 0000   AST 24 11/03/2016 1023   ALT 15 09/04/2021 0000   ALT 17 11/03/2016 1023   ALKPHOS 99 09/04/2021 0000   ALKPHOS 78 11/03/2016 1023   BILITOT 0.62 11/03/2016 1023   GFRNONAA >60 10/09/2020 0626   GFRAA >60 08/13/2018 0236   Lab Results  Component Value Date   WBC 5.1 09/04/2021   NEUTROABS 3.21 09/04/2021   HGB 12.6 09/04/2021  HCT 38 09/04/2021   MCV 90 02/10/2021   PLT 216 09/04/2021    Imaging:  [facility] Clinician Interpretation: I have personally reviewed the CNS images as listed.  My interpretation, in the context of the patient's clinical presentation, is stable disease  MR BRAIN W WO CONTRAST  Result Date: 10/20/2021 CLINICAL DATA:  Brain/CNS neoplasm, assess treatment response. Metastatic endometrial cancer. History of SRS and tumor resection in 2021. EXAM: MRI HEAD WITHOUT AND WITH CONTRAST TECHNIQUE: Multiplanar, multiecho pulse sequences of the brain and surrounding structures were obtained without and with intravenous contrast. CONTRAST:  7mL MULTIHANCE GADOBENATE DIMEGLUMINE 529 MG/ML IV SOLN COMPARISON:  07/17/2021 FINDINGS: Brain: Sequelae of right suboccipital craniotomy and tumor resection are again identified with unchanged encephalomalacia and gliosis in the right cerebellar hemisphere as well as a small amount of chronic blood  products. A small amount non-masslike enhancement within and adjacent to the resection cavity is unchanged. No new enhancing intracranial lesion is identified. No acute infarct, midline shift, or extra-axial fluid collection is evident. T2 hyperintensities in the cerebral white matter bilaterally are unchanged and nonspecific but compatible with moderate chronic small vessel ischemic disease. There is mild cerebral atrophy. Vascular: Major intracranial vascular flow voids are preserved. Skull and upper cervical spine: Right suboccipital craniotomy. No suspicious marrow lesion. Sinuses/Orbits: Unremarkable orbits. Tiny mucous retention cyst in the right maxillary sinus. Clear mastoid air cells. Other: None. IMPRESSION: 1. Stable post treatment changes in the right cerebellum. 2. No evidence of new intracranial metastases. 3. Moderate chronic small vessel ischemic disease. Electronically Signed   By: Logan Bores M.D.   On: 10/20/2021 12:41     Assessment/Plan Brain metastasis ([facility]) [C79.31]  Brianna Herring is clinically and radiographically stable today.  No new or progressive issues today.  We appreciate the opportunity to participate in the care of Sunset Joshi.  We ask that Yassmin Binegar return to clinic in 6 months following next brain MRI, or sooner as needed.  All questions were answered. The patient knows to call the clinic with any problems, questions or concerns. No barriers to learning were detected.  The total time spent in the encounter was 30 minutes and more than 50% was on counseling and review of test results   Ventura Sellers, MD Medical Director of Neuro-Oncology [facility] [place] 10/26/21 12:41 PM

## 2021-10-27 ENCOUNTER — Telehealth: Payer: Self-pay | Admitting: Internal Medicine

## 2021-10-27 NOTE — Telephone Encounter (Signed)
Scheduled per 12/19 los, message has been left with pt

## 2021-12-04 NOTE — Progress Notes (Signed)
Brianna Herring  [address] [place],  [place]  [address] [phone]  Clinic Day:  12/16/2021  Referring physician: Venetia Maxon, [name], *  This document serves as a record of services personally performed by Hosie Poisson, MD. It was created on their behalf by Curry,Lauren E, a trained medical scribe. The creation of this record is based on the scribe's personal observations and the provider's statements to them.  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 female with recurrent endometrial carcinoma with lung metastasis.  She was originally diagnosed with stage IB (T1b N0 M0) high-grade endometrial carcinoma in late 2017. She had a robotic hysterectomy and bilateral salpingo-oophorectomy in January 2018.  Pathology revealed a 4 cm, grade 3, endometrioid adenocarcinoma with over 50% invasion of the myometrium and lymphovascular invasion.  Six lymph nodes were negative for metastasis.  Margins were clear.  She received adjuvant external beam radiation, completed in April 2018, followed by vaginal brachytherapy to the vaginal cuff.   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  MSI was high.  A referral to [facility] was ordered in 2018, but the patient never followed through.   CT chest, abdomen and pelvis prior to surgery revealed subtle low-attenuation in the central uterus, as well as small bibasilar pulmonary nodules, which were suspicious for metastatic disease, but only up to 6 mm in size.  PET scan in February 2018 did not reveal hypermetabolic activity in the lung nodules or in the left inguinal node or portacaval node.  There was mild uptake of the pelvic sidewall, which was felt to represent postoperative changes.   The patient was seen in routine follow-up in  March 2019.  Repeat CT chest revealed new pulmonary nodules measuring up to 1.8 cm in the right middle lobe and not in the area of the prior tiny nodules, with a 1.5 cm left lower lobe cavitary nodule.  The previously seen changes of the pelvic sidewall on the prior PET scan had resolved.  PET in May revealed hypermetabolic activity within the pulmonary nodules consistent with metastatic disease.  There was mild hypermetabolic activity within the portacaval node, which was still felt to be reactive.  We recommended 6 cycles of carboplatin and paclitaxel, but when she presented for chemotherapy education on May 29, her sister requested a referral to a pulmonologist for a definitive tissue diagnosis.  Dr. Melvyn Novas at [facility] pulmonology went over all the risks and benefits of having a bronchoscopy.  The patient decided it was not worth the risk to undergo biopsy and decided to proceed with chemotherapy.  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  We did obtain MLH1 methylation analysis and there was hypermethylation of MLH1, so this does not likely represent Lynch syndrome.    She received palliative carboplatin/paclitaxel from June to September 2019, completing 6 cycles.  Repeat CT imaging after 3 cycles of carboplatin/paclitaxel revealed a good response with a decrease in the size and number of pulmonary nodules.  She had chronic hematuria and saw Dr. Nila Nephew, and cystoscopy revealed findings of radiation cystitis.  She has also had recurrent urinary tract infections, so was placed on D-mannose for prevention.  CT chest,  abdomen and pelvis in October 2019 revealed stable to slightly decreased metastatic lung lesions with no other sites of metastatic disease.  Chemotherapy was discontinued and she was placed on tamoxifen 20 mg daily for maintenance.  Repeat CT scans in January 2020 revealed scattered small  bilateral pulmonary nodules that were overall grossly unchanged, including a 10 mm irregular nodule of the right upper lobe and two 6 mm nodules of the right lower lobe.  CT imaging in April was fairly stable with just a slight increase in the right middle lobe pulmonary nodule, which was difficult to assess due to motion artifact.  Unfortunately, CT imaging in August revealed considerable enlargement of the right middle lobe pulmonary nodule from 1 cm to 2.3 cm consistent with progressive pulmonary metastasis.  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  [facility] CDx testing reveals MSI high with an increased TMB (tumor mutational burden) making the patient eligible for immunotherapy. Tamoxifen was discontinued.  She was placed on palliative pembrolizumab every 3 weeks in October 2020.  She presented to the emergency department with shortness of breath in November 2020.  CT angiogram chest revealed small filling defects in the right lower lobe and probably right upper lobe pulmonary arteries consistent with small pulmonary emboli. The metastatic lesion within the right middle lobe was stable at 3 cm.  There was scattered subsegmental atelectasis.  The patient was hospitalized at that time, as she presented with hypoxia.  She initially was given enoxaparin and then transitioned to rivaroxaban.    After completion of 6 cycles of pembrolizumab, she had progressive disease within the chest.  CT chest, abdomen, and pelvis in February 2021 revealed a dramatic increase in the size of the dominant right middle lobe pulmonary nodule, now measuring 5.1 x 5.1 cm, previously measuring 2.4 x 2.3 cm.  The smaller pulmonary nodules in the right lower lobe were stable, but one new pulmonary nodule was seen within the central right middle lobe measuring 14 mm.  There was  no evidence of local recurrence of endometrial carcinoma in the pelvis or evidence of metastatic disease in the abdomen or pelvis.  The bladder, kidneys and ureters appeared normal.  In light of these results, her treatment was changed to carboplatin/paclitaxel/bevacizumab beginning in February. She presented to the emergency department with vaginal bleeding in March.  This was felt to be hematuria due to urinary tract infection, and she was found to have Klebsiella pneumoniae in the urine and was discharged on [place].  She had anemia felt to be secondary to blood loss and her hemoglobin dropped to 8.8, but was back up to 9.5 at the time of discharge. CT chest angiogram did not reveal any evidence of pulmonary embolism.  There was an interval decrease in the pulmonary nodules.  The rivaroxaban was resumed on discharge.  Bevacizumab was permanently discontinued due to continued bleeding.  She saw Dr. Denman George for a vaginal exam in some time.  No vaginal mass or source of bleeding was identified on vaginal examination.  She had recurrent bleeding, so Dr. Venetia Maxon had held her rivaroxaban.  She was found to have another urinary tract infection, this time with Staphylococcal hemolyticus, which was treated with nitrofurantoin.     CT chest, abdomen and pelvis  in June of 2021 revealed an excellent response to treatment.  The large right middle lobe mass had resolved,  and the right lower lobe pulmonary nodules remain stable at 5 mm.  There was no evidence of metastatic disease in the abdomen or pelvis.  She had had right calf tenderness, redness and warmth and was seen by by Dr. Venetia Maxon.  Ultrasound was negative for DVT.  He felt this represented cellulitis, so treated her with cephalexin 500 mg every 6 hours for 5 days.  We extended that course for an additional 10 days in mid June in view of her immunosuppression.  She had significant improvement in her hemoglobin in July.  She still had borderline  hypokalemia despite  potassium chloride 20 mEq 3 times daily, so we recommended she increase this to 4 times daily.   CT chest in September 2021 revealed stable small pulmonary nodules without evidence of metastatic disease within the abdomen and pelvis.  She was doing well at the time, so was scheduled for 3 month follow up with repeat imaging.  The patient presented in November 2021 with headache, nausea and vomiting, as well as dizziness and weight loss.  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 Pathology confirmed metastatic endometrial adenocarcinoma.  She did well postop and was tapered off dexamethasone. She continues to follow with Dr. Mickeal Skinner and MRI brain from September revealed stable post-surgical and post-treatment appearance of the right cerebellar hemisphere. No new intracranial metastatic lesion is identified. CT chest, abdomen and pelvis from October 28th,2022 revealed no new evidence of metastatic disease in the chest, abdomen or pelvis. Stable 5 mm right lower lobe pulmonary nodules. Right thyroid nodule stable with respect imaged portions less than 1.5 cm on previous imaging. Mild hepatic steatosis.   INTERVAL HISTORY:  Brianna Herring is here for routine follow up and her main complaint is dizziness. It really seems more like a vertigo and is occasionally associated with nausea. She otherwise feels well. MRI brain from December 2022 with Dr. Mickeal Skinner revealed stable post treatment changes in the right cerebellum with no evidence of new intracranial metastases. CBC and CMP were unremarkable except for a non-fasting blood sugar of 139.  Her  appetite is good, and she is eating well.  She denies fever, chills or other signs of infection.  She denies nausea, vomiting, bowel issues, or abdominal pain.  She denies sore throat, cough, dyspnea, or chest pain.  REVIEW OF SYSTEMS:  Review of Systems  Constitutional:  Negative.  Negative for appetite change, chills, fatigue, fever and unexpected weight change.  HENT:  Negative.    Eyes: Negative.   Respiratory: Negative.  Negative for chest tightness, cough, hemoptysis, shortness of breath and wheezing.   Cardiovascular: Negative.  Negative for chest pain, leg swelling and palpitations.  Gastrointestinal: Negative.  Negative for abdominal distention, abdominal pain, blood in stool, constipation, diarrhea, nausea and vomiting.  Endocrine: Negative.   Genitourinary: Negative.  Negative for difficulty urinating, dysuria, frequency and hematuria.   Musculoskeletal: Negative.  Negative for arthralgias, back pain, flank pain, gait problem and myalgias.  Skin: Negative.   Neurological:  Positive for dizziness. Negative for extremity weakness, gait problem, headaches, light-headedness, numbness, seizures and speech difficulty.  Hematological: Negative.   Psychiatric/Behavioral: Negative.  Negative for depression and sleep disturbance. The patient is not nervous/anxious.     VITALS:  Blood pressure (!) 157/74, pulse 99, temperature 98 F (36.7 C), temperature source Oral, resp. rate 16, height 5' 6" (1.676 m), weight 224 lb 6.4 oz (101.8 kg), SpO2  95 %.  Wt Readings from Last 3 Encounters:  12/10/21 224 lb 6.4 oz (101.8 kg)  10/26/21 224 lb 4.8 oz (101.7 kg)  09/09/21 223 lb 11.2 oz (101.5 kg)    Body mass index is 36.22 kg/m.  Performance status (ECOG): 0 - Asymptomatic  PHYSICAL EXAM:  Physical Exam Constitutional:      General: She is not in acute distress.    Appearance: Normal appearance. She is normal weight.  HENT:     Head: Normocephalic and atraumatic.     Ears:     Comments: Left ear is very ceruminous and I could no visualize the tympanic membrane. The right ear has serous otitis with a diffuse light reflex of the ear drum. Eyes:     General: No scleral icterus.    Extraocular Movements: Extraocular movements intact.      Conjunctiva/sclera: Conjunctivae normal.     Pupils: Pupils are equal, round, and reactive to light.  Cardiovascular:     Rate and Rhythm: Normal rate and regular rhythm.     Pulses: Normal pulses.     Heart sounds: Normal heart sounds. No murmur heard.   No friction rub. No gallop.  Pulmonary:     Effort: Pulmonary effort is normal. No respiratory distress.     Breath sounds: Normal breath sounds.  Abdominal:     General: Bowel sounds are normal. There is no distension.     Palpations: Abdomen is soft. There is no hepatomegaly, splenomegaly or mass.     Tenderness: There is no abdominal tenderness.  Musculoskeletal:        General: Normal range of motion.     Cervical back: Normal range of motion and neck supple.     Right lower leg: No edema.     Left lower leg: No edema.  Lymphadenopathy:     Cervical: No cervical adenopathy.  Skin:    General: Skin is warm and dry.  Neurological:     General: No focal deficit present.     Mental Status: She is alert and oriented to person, place, and time. Mental status is at baseline.  Psychiatric:        Mood and Affect: Mood normal.        Behavior: Behavior normal.        Thought Content: Thought content normal.        Judgment: Judgment normal.   LABS:   CBC Latest Ref Rng & Units 12/10/2021 09/04/2021 02/10/2021  WBC - 5.2 5.1 4.5  Hemoglobin 12.0 - 16.0 13.4 12.6 12.5  Hematocrit 36 - 46 40 38 38  Platelets 150 - 399 231 216 204   CMP Latest Ref Rng & Units 12/10/2021 09/04/2021 02/10/2021  Glucose 70 - 99 mg/dL - - -  BUN 4 - _0 Creatinine 0.5 - 1.1 0.6 0.7 0.6  Sodium 137 - 147 139 141 138  Potassium 3.4 - 5.3 3.8 3.5 3.5  Chloride 99 - 108 102 103 103  CO2 13 - 22 30(A) 30(A) 26(A)  Calcium 8.7 - 10.7 8.8 8.6(A) 9.2  Total Protein 6.4 - 8.3 g/dL - - -  Total Bilirubin 0.20 - 1.20 mg/dL - - -  Alkaline Phos 25 - 125 89 99 69  AST 13 - 35 _1 ALT 7 - 35 _2 Lab Results  Component Value Date    CAN125 7.2 12/10/2021      STUDIES:  No results  found.   EXAM: 10/19/2021 MRI HEAD WITHOUT AND WITH CONTRAST   TECHNIQUE: Multiplanar, multiecho pulse sequences of the brain and surrounding structures were obtained without and with intravenous contrast.   CONTRAST:  60m MULTIHANCE GADOBENATE DIMEGLUMINE 529 MG/ML IV SOLN   COMPARISON:  07/17/2021   FINDINGS: Brain: Sequelae of right suboccipital craniotomy and tumor resection are again identified with unchanged encephalomalacia and gliosis in the right cerebellar hemisphere as well as a small amount of chronic blood products. A small amount non-masslike enhancement within and adjacent to the resection cavity is unchanged.   No new enhancing intracranial lesion is identified. No acute infarct, midline shift, or extra-axial fluid collection is evident. T2 hyperintensities in the cerebral white matter bilaterally are unchanged and nonspecific but compatible with moderate chronic small vessel ischemic disease. There is mild cerebral atrophy.   Vascular: Major intracranial vascular flow voids are preserved.   Skull and upper cervical spine: Right suboccipital craniotomy. No suspicious marrow lesion.   Sinuses/Orbits: Unremarkable orbits. Tiny mucous retention cyst in the right maxillary sinus. Clear mastoid air cells.   Other: None.   IMPRESSION: 1. Stable post treatment changes in the right cerebellum. 2. No evidence of new intracranial metastases. 3. Moderate chronic small vessel ischemic disease.  HISTORY:   Allergies: No Known Allergies  Current Medications: Current Outpatient Medications  Medication Sig Dispense Refill   acetaminophen (TYLENOL) 500 MG tablet Take 500 mg by mouth every 6 (six) hours as needed.     Calcium Carbonate (CALTRATE 600 PO) Take 1 tablet by mouth in the morning and at bedtime.     meclizine (ANTIVERT) 25 MG tablet Take 1 tablet (25 mg total) by mouth 3 (three) times daily as needed for  dizziness. 30 tablet 0   Multiple Vitamin (MULTIVITAMIN WITH MINERALS) TABS tablet Take 1 tablet by mouth daily. Centrum Silver     potassium chloride SA (KLOR-CON) 20 MEQ tablet Take 3 tablets (60 mEq total) by mouth 2 (two) times daily. 180 tablet 5   Probiotic Product (PROBIOTIC PO) Take 1 capsule by mouth daily.     No current facility-administered medications for this visit.     ASSESSMENT & PLAN:   Assessment: 1. History of stage IB high-grade endometrial carcinoma treated with hysterectomy/bilateral salpingo-oophorectomy, followed by adjuvant radiation and brachytherapy in 2018.   2. 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  She had progression of her disease in August 2020. As her tumor was MSI-high, she was placed on palliative pembrolizumab. Unfortunately, she had progressive disease after 6 cycles of pembrolizumab.  She was therefore placed on palliative carboplatin/paclitaxel/bevacizumb. Bevacizumab was discontinued due to persistent vaginal bleeding/hematuria and epistaxis.  She completed 6 cycles of carboplatin/paclitaxel in May 2020. CT imaging in October 2022 remains stable.   3. Pulmonary emboli in February 2021.  She is at increased risk for thrombosis due to her malignancy, but due to recurrent bleeding, we discontinued rivaroxaban .  She has not had evidence of recurrent thrombosis.  4. Solitary right cerebellar metastasis, status post preoperative stereotactic radio surgery followed by surgical resection.   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  She continues to follow with Dr. VMickeal Skinnerof Neuro-Oncology.  MRI in September revealed stable post-surgical and post-treatment appearance of the right cerebellar hemisphere. No new intracranial metastatic lesion is identified.   5. Serous otitis. I do believe that this is the  cause of her  dizziness. I have explained that she has too much wax in the left ear canal to be able to visualize the tympanic membrane. I recommend that she follow up with Dr. Venetia Maxon regarding this. I will place her on Antivert 25 mg TID prn for dizziness.   Plan:   She will follow up with Dr. Mickeal Skinner again in March with repeat MRI. As she continues to do well, we will plan to see her back in 3 months with a CBC, comprehensive metabolic panel, CA 034 and CT chest, abdomen and pelvis. That will be 6 months from her last scans. I will prescribe Antivert for her dizziness but I do think she needs to see Dr. Venetia Maxon and he may need to address the wax impaction of the left ear. The patient understands the plans discussed today and is in agreement with them.  She knows to contact our office if she develops concerns prior to her next appointment.  I provided 15 minutes of face-to-face time during this this encounter and > 50% was spent counseling as documented under my assessment and plan.    I, Rita Ohara, am acting as scribe for Derwood Kaplan, MD  I have reviewed this report as typed by the medical scribe, and it is complete and accurate.

## 2021-12-10 ENCOUNTER — Other Ambulatory Visit: Payer: Self-pay | Admitting: Hematology and Oncology

## 2021-12-10 ENCOUNTER — Other Ambulatory Visit: Payer: Self-pay | Admitting: Oncology

## 2021-12-10 ENCOUNTER — Inpatient Hospital Stay: Payer: Medicare Other | Attending: Internal Medicine | Admitting: Oncology

## 2021-12-10 ENCOUNTER — Other Ambulatory Visit: Payer: Self-pay

## 2021-12-10 ENCOUNTER — Encounter: Payer: Self-pay | Admitting: Oncology

## 2021-12-10 ENCOUNTER — Inpatient Hospital Stay: Payer: Medicare Other

## 2021-12-10 VITALS — BP 157/74 | HR 99 | Temp 98.0°F | Resp 16 | Ht 66.0 in | Wt 224.4 lb

## 2021-12-10 DIAGNOSIS — C7931 Secondary malignant neoplasm of brain: Secondary | ICD-10-CM | POA: Diagnosis not present

## 2021-12-10 DIAGNOSIS — C7801 Secondary malignant neoplasm of right lung: Secondary | ICD-10-CM | POA: Diagnosis not present

## 2021-12-10 DIAGNOSIS — C541 Malignant neoplasm of endometrium: Secondary | ICD-10-CM | POA: Diagnosis not present

## 2021-12-10 DIAGNOSIS — R42 Dizziness and giddiness: Secondary | ICD-10-CM

## 2021-12-10 DIAGNOSIS — C7802 Secondary malignant neoplasm of left lung: Secondary | ICD-10-CM

## 2021-12-10 LAB — BASIC METABOLIC PANEL
BUN: 13 (ref 4–21)
CO2: 30 — AB (ref 13–22)
Chloride: 102 (ref 99–108)
Creatinine: 0.6 (ref 0.5–1.1)
Glucose: 139
Potassium: 3.8 (ref 3.4–5.3)
Sodium: 139 (ref 137–147)

## 2021-12-10 LAB — COMPREHENSIVE METABOLIC PANEL
Albumin: 4.2 (ref 3.5–5.0)
Calcium: 8.8 (ref 8.7–10.7)

## 2021-12-10 LAB — CBC AND DIFFERENTIAL
HCT: 40 (ref 36–46)
Hemoglobin: 13.4 (ref 12.0–16.0)
Neutrophils Absolute: 3.28
Platelets: 231 (ref 150–399)
WBC: 5.2

## 2021-12-10 LAB — HEPATIC FUNCTION PANEL
ALT: 18 (ref 7–35)
AST: 30 (ref 13–35)
Alkaline Phosphatase: 89 (ref 25–125)
Bilirubin, Total: 0.6

## 2021-12-10 LAB — CBC: RBC: 4.47 (ref 3.87–5.11)

## 2021-12-10 MED ORDER — MECLIZINE HCL 25 MG PO TABS: 25.0000 mg | ORAL_TABLET | Freq: Three times a day (TID) | ORAL | 0 refills | Status: DC | PRN

## 2021-12-11 ENCOUNTER — Telehealth: Payer: Self-pay

## 2021-12-11 LAB — CA 125: Cancer Antigen (CA) 125: 7.2 U/mL (ref 0.0–38.1)

## 2021-12-11 NOTE — Telephone Encounter (Signed)
-----   Message from Derwood Kaplan, MD sent at 12/10/2021  5:18 PM EST ----- Regarding: appt Would like her PCP, Dr Venetia Maxon, to see her regarding her ears. C/o vertigo and nausea, I ordered Antivert but left ear ceruminous, couldn't see eardrum, right eardrum appears abnormal. (She has had cranial irradiation for brain mets)

## 2021-12-11 NOTE — Telephone Encounter (Signed)
Appointment with Dr. Venetia Maxon on Tuesday, Feb. 7, 2023 at 4:10 pm. Left message for patient on VM.

## 2022-02-05 ENCOUNTER — Telehealth: Payer: Self-pay | Admitting: Oncology

## 2022-02-05 NOTE — Telephone Encounter (Signed)
CT Chest/Abd/Pel w/ contrast is scheduled for 03/05/22 @ 11 am ; check in @ 10 am. ? ? ? ?LVM notifying pt of date,time and instructions.  ? ?NPO 2 hours prior exam ?Oral contrast to be picked up by 03/04/22 ?

## 2022-02-10 ENCOUNTER — Telehealth: Payer: Self-pay | Admitting: Oncology

## 2022-02-10 NOTE — Telephone Encounter (Signed)
02/10/22 Spoke with patient and resched CT SCANS 03/05/22 arrive at 230pm.CT SCANS AT 330PM. ?

## 2022-02-25 ENCOUNTER — Telehealth: Payer: Self-pay

## 2022-02-25 NOTE — Telephone Encounter (Addendum)
02/26/22 Pt notified that Dr Hinton Rao agreed to let pt try taking Potassium 20 mEq 2 tabs twice dail until next office visit. ? ?02/25/2022 - Pt wanted to know if she still had to take 3 potassium pills BID? She states, "they are getting hard to swallow. Isn't there any other way to take it"? She also mentioned her right ear feeling pressure. I told her she needed to f/u with her PCP regarding that (she just completed course of Claritin D and ear gtts from Dr Venetia Maxon).  ?

## 2022-03-05 LAB — BASIC METABOLIC PANEL
BUN: 10 (ref 4–21)
CO2: 28 — AB (ref 13–22)
Chloride: 100 (ref 99–108)
Creatinine: 0.5 (ref <=1.1)
Glucose: 92
Potassium: 3.4 mEq/L — AB (ref 3.5–5.1)
Sodium: 139 (ref 137–147)

## 2022-03-05 LAB — CBC: RBC: 3.99 (ref 3.87–5.11)

## 2022-03-05 LAB — COMPREHENSIVE METABOLIC PANEL: Calcium: 9.3 (ref 8.7–10.7)

## 2022-03-05 LAB — CBC AND DIFFERENTIAL
HCT: 36 (ref 36–46)
Hemoglobin: 11.8 — AB (ref 12.0–16.0)
Neutrophils Absolute: 2.4
Platelets: 261 10*3/uL (ref 150–400)
WBC: 4

## 2022-03-05 LAB — HEPATIC FUNCTION PANEL
ALT: 21 U/L (ref 7–35)
AST: 26 (ref 13–35)
Alkaline Phosphatase: 69 (ref 25–125)
Bilirubin, Total: 0.5

## 2022-03-08 ENCOUNTER — Other Ambulatory Visit: Payer: Self-pay

## 2022-03-08 ENCOUNTER — Other Ambulatory Visit: Payer: Self-pay | Admitting: Oncology

## 2022-03-08 ENCOUNTER — Inpatient Hospital Stay: Payer: Medicare Other | Attending: Internal Medicine | Admitting: Oncology

## 2022-03-08 ENCOUNTER — Encounter: Payer: Self-pay | Admitting: Oncology

## 2022-03-08 VITALS — BP 152/78 | HR 90 | Temp 98.1°F | Resp 20 | Ht 66.0 in | Wt 220.7 lb

## 2022-03-08 DIAGNOSIS — Z79899 Other long term (current) drug therapy: Secondary | ICD-10-CM | POA: Insufficient documentation

## 2022-03-08 DIAGNOSIS — C541 Malignant neoplasm of endometrium: Secondary | ICD-10-CM | POA: Diagnosis not present

## 2022-03-08 DIAGNOSIS — C7802 Secondary malignant neoplasm of left lung: Secondary | ICD-10-CM | POA: Diagnosis not present

## 2022-03-08 DIAGNOSIS — C7801 Secondary malignant neoplasm of right lung: Secondary | ICD-10-CM | POA: Diagnosis not present

## 2022-03-08 DIAGNOSIS — C7931 Secondary malignant neoplasm of brain: Secondary | ICD-10-CM | POA: Insufficient documentation

## 2022-03-08 NOTE — Progress Notes (Signed)
?[facility]  ?[address] ?[place],  [place]  [address] ?[phone] ? ?Clinic Day:  03/08/22 ? ?Referring physician: Street, [name], * ? 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 ?Current Treatment:    Observation ? ? ?HISTORY OF PRESENT ILLNESS:  ?Brianna Herring is a 66 y.o. female with recurrent endometrial carcinoma with lung metastasis.  She was originally diagnosed with stage IB (T1b N0 M0) high-grade endometrial carcinoma in late 2017. She had a robotic hysterectomy and bilateral salpingo-oophorectomy in January 2018.  Pathology revealed a 4 cm, grade 3, endometrioid adenocarcinoma with over 50% invasion of the myometrium and lymphovascular invasion.  Six lymph nodes were negative for metastasis.  Margins were clear.  She received adjuvant external beam radiation, completed in April 2018, followed by vaginal brachytherapy to the vaginal cuff.   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  MSI was high.  A referral to [facility] was ordered in 2018, but the patient never followed through.   CT chest, abdomen and pelvis prior to surgery revealed subtle low-attenuation in the central uterus, as well as small bibasilar pulmonary nodules, which were suspicious for metastatic disease, but only up to 6 mm in size.  PET scan in February 2018 did not reveal hypermetabolic activity in the lung nodules or in the left inguinal node or portacaval node.  There was mild uptake of the pelvic sidewall, which was felt to represent postoperative changes. ?  ?The patient was seen in routine follow-up in March 2019.  Repeat CT chest revealed new pulmonary nodules measuring up to 1.8 cm in the right middle lobe and not in the area of the prior tiny nodules, with a 1.5 cm left lower lobe cavitary nodule.  The previously seen changes of the pelvic sidewall on the prior PET scan had  resolved.  PET in May revealed hypermetabolic activity within the pulmonary nodules consistent with metastatic disease.  There was mild hypermetabolic activity within the portacaval node, which was still felt to be reactive.  We recommended 6 cycles of carboplatin and paclitaxel, but when she presented for chemotherapy education on May 29, her sister requested a referral to a pulmonologist for a definitive tissue diagnosis.  Dr. Melvyn Novas at [facility] pulmonology went over all the risks and benefits of having a bronchoscopy.  The patient decided it was not worth the risk to undergo biopsy and decided to proceed with chemotherapy.  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  We did obtain MLH1 methylation analysis and there was hypermethylation of MLH1, so this does not likely represent Lynch syndrome.  ?  ?She received palliative carboplatin/paclitaxel from June to September 2019, completing 6 cycles.  Repeat CT imaging after 3 cycles of carboplatin/paclitaxel revealed a good response with a decrease in the size and number of pulmonary nodules.  She had chronic hematuria and saw Dr. Nila Nephew, and cystoscopy revealed findings of radiation cystitis.  She has also had recurrent urinary tract infections, so was placed on D-mannose for prevention.  CT chest, abdomen and pelvis in October 2019 revealed stable to slightly decreased metastatic lung lesions with no other sites of metastatic disease.  Chemotherapy was discontinued and she was placed on tamoxifen 20 mg daily for maintenance.  Repeat CT scans in January 2020 revealed scattered small bilateral  pulmonary nodules that were overall grossly unchanged, including a 10 mm irregular nodule of the right upper lobe and two 6 mm nodules of the right lower lobe.  CT imaging in April was fairly stable with just a slight increase in the right middle lobe pulmonary nodule,  which was difficult to assess due to motion artifact.  Unfortunately, CT imaging in August revealed considerable enlargement of the right middle lobe pulmonary nodule from 1 cm to 2.3 cm consistent with progressive pulmonary metastasis.  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  [facility] One CDx testing reveals MSI high with an increased TMB (tumor mutational burden) making the patient eligible for immunotherapy. Tamoxifen was discontinued.  She was placed on palliative pembrolizumab every 3 weeks in October 2020.  She presented to the emergency department with shortness of breath in November 2020.  CT angiogram chest revealed small filling defects in the right lower lobe and probably right upper lobe pulmonary arteries consistent with small pulmonary emboli. The metastatic lesion within the right middle lobe was stable at 3 cm.  There was scattered subsegmental atelectasis.  The patient was hospitalized at that time, as she presented with hypoxia.  She initially was given enoxaparin and then transitioned to rivaroxaban.   ? ?After completion of 6 cycles of pembrolizumab, she had progressive disease within the chest.  CT chest, abdomen, and pelvis in February 2021 revealed a dramatic increase in the size of the dominant right middle lobe pulmonary nodule, now measuring 5.1 x 5.1 cm, previously measuring 2.4 x 2.3 cm.  The smaller pulmonary nodules in the right lower lobe were stable, but one new pulmonary nodule was seen within the central right middle lobe measuring 14 mm.  There was no evidence of local recurrence of endometrial carcinoma in the pelvis or evidence of metastatic disease in the abdomen or pelvis.  The bladder, kidneys and ureters appeared normal.  In light of these results, her treatment was changed to carboplatin/paclitaxel/bevacizumab  beginning in February. She presented to the emergency department with vaginal bleeding in March.  This was felt to be hematuria due to urinary tract infection, and she was found to have Klebsiella pneumoniae in the urine and was discharged on [place].  She had anemia felt to be secondary to blood loss and her hemoglobin dropped to 8.8, but was back up to 9.5 at the time of discharge. CT chest angiogram did not reveal any evidence of pulmonary embolism.  There was an interval decrease in the pulmonary nodules.  The rivaroxaban was resumed on discharge.  Bevacizumab was permanently discontinued due to continued bleeding.  She saw Dr. Denman George for a vaginal exam in some time.  No vaginal mass or source of bleeding was identified on vaginal examination.  She had recurrent bleeding, so Dr. Venetia Maxon had held her rivaroxaban.  She was found to have another urinary tract infection, this time with Staphylococcal hemolyticus, which was treated with nitrofurantoin.   ?  ?CT chest, abdomen and pelvis  in June of 2021 revealed an excellent response to treatment.  The large right middle lobe mass had resolved, and the right lower lobe pulmonary nodules remain stable at 5 mm.  There was no evidence of metastatic disease in the abdomen or pelvis.  She had had right calf tenderness, redness and warmth and was seen by by Dr. Venetia Maxon.  Ultrasound was negative for  DVT.  He felt this represented cellulitis, so treated her with cephalexin 500 mg. ?CT chest in September 2021 revealed stable small pulmonary nodules without evidence of metastatic disease within the abdomen and pelvis.  She was doing well at the time, so was scheduled for 3 month follow up with repeat imaging.  The patient presented in November 2021 with headache, nausea and vomiting, as well as dizziness and weight loss.  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  Pathology confirmed metastatic endometrial adenocarcinoma.  She did well postop and was tapered off dexamethasone. She continues to follow with Dr. Mickeal Skinner and MRI brain from September 2022 revealed stable post-surgical a

## 2022-03-09 ENCOUNTER — Other Ambulatory Visit: Payer: Self-pay | Admitting: Oncology

## 2022-03-10 ENCOUNTER — Encounter: Payer: Self-pay | Admitting: Oncology

## 2022-03-16 ENCOUNTER — Other Ambulatory Visit: Payer: Self-pay | Admitting: Radiation Therapy

## 2022-03-29 ENCOUNTER — Telehealth: Payer: Self-pay | Admitting: *Deleted

## 2022-03-29 ENCOUNTER — Other Ambulatory Visit: Payer: Self-pay | Admitting: Radiation Therapy

## 2022-03-29 NOTE — Telephone Encounter (Signed)
Patients sister Ava Harrington called to report that patient has been having headaches for almost a month.  She is unsure if the patient has been taking anything.  She states that the patient doesn't complain much so if she is complaining she is worried and she usually avoids taking anything.  Wanted to know if the scan should be moved up.  Currently scheduled for 04/15/2022.  Routed to MD to advise.

## 2022-03-29 NOTE — Telephone Encounter (Signed)
Patients sister did advise that there has been more confusion/difficulty with finding the appropriate words.  In light of this information we did move up the MRI and MD visit to review those results.  Sister Ava is aware.  No further questions.

## 2022-03-30 ENCOUNTER — Ambulatory Visit
Admission: RE | Admit: 2022-03-30 | Discharge: 2022-03-30 | Disposition: A | Discharge status: Home / Self Care | Payer: Medicare Other | Source: Ambulatory Visit | Attending: Internal Medicine | Admitting: Internal Medicine

## 2022-03-30 DIAGNOSIS — C7931 Secondary malignant neoplasm of brain: Secondary | ICD-10-CM

## 2022-03-30 MED ORDER — GADOBENATE DIMEGLUMINE 529 MG/ML IV SOLN
20.0000 mL | Freq: Once | INTRAVENOUS | Status: AC | PRN
  Administered 2022-03-30: 20 mL via INTRAVENOUS

## 2022-04-01 ENCOUNTER — Telehealth: Payer: Self-pay | Admitting: Internal Medicine

## 2022-04-01 ENCOUNTER — Inpatient Hospital Stay (HOSPITAL_BASED_OUTPATIENT_CLINIC_OR_DEPARTMENT_OTHER): Payer: Medicare Other | Admitting: Internal Medicine

## 2022-04-01 ENCOUNTER — Other Ambulatory Visit: Payer: Self-pay

## 2022-04-01 VITALS — BP 131/61 | HR 99 | Temp 97.7°F | Resp 20 | Wt 220.9 lb

## 2022-04-01 DIAGNOSIS — Z79899 Other long term (current) drug therapy: Secondary | ICD-10-CM | POA: Diagnosis not present

## 2022-04-01 DIAGNOSIS — C541 Malignant neoplasm of endometrium: Secondary | ICD-10-CM | POA: Diagnosis present

## 2022-04-01 DIAGNOSIS — C7931 Secondary malignant neoplasm of brain: Secondary | ICD-10-CM

## 2022-04-01 NOTE — Telephone Encounter (Signed)
Per 5/25 los called and left message for pt about december appointment call back number number

## 2022-04-01 NOTE — Progress Notes (Signed)
[facility] at [place], [place] [address] [phone]   Interval Evaluation  Date of Service: 04/01/22 Patient Name: Brianna Herring Patient MRN: [id number] Patient DOB: 10/21/1956 Provider: Ventura Sellers, MD  Identifying Statement:  Brianna Herring is a 66 y.o. female with Malignant neoplasm metastatic to brain [facility]) [C79.31]   Primary Cancer: Endometrial, Stage IV  CNS Oncologic History 10/06/20: Pre-op SRS R cerebellar metastasis Isidore Moos) 10/08/20: Craniotomy, resection Marcello Moores)  Interval History:  Brianna Herring presents today for follow up after recent MRI brain. She did experience a severe headaches this past month, some migraine symptoms. Otherwise also has milder headaches 2-3x per week.  She remains active and continues to work full time.  Continues to have some dizziness upon standing at times.  Denies seizures.    H+P (10/13/20) Patient presented to medical attention with new onset complaint of headache and gait instability, earlier this month.  Symptoms had been progressive over several weeks, at least.  Headaches were holocranial and occurring in the AM.  Gait issues were non specific, she never needed gait assist aside from "holding on" to family and objects.  Decadron was started by Dr. Hinton Rao, this led to considerable improvement and essentially resolution of clinical complaints.  She is currently dosing '4mg'$  twice per day.    Medications: Current Outpatient Medications on File Prior to Visit  Medication Sig Dispense Refill   acetaminophen (TYLENOL) 500 MG tablet Take 500 mg by mouth every 6 (six) hours as needed.     Calcium Carbonate (CALTRATE 600 PO) Take 1 tablet by mouth in the morning and at bedtime.     meclizine (ANTIVERT) 25 MG tablet Take 1 tablet (25 mg total) by mouth 3 (three) times daily as needed for dizziness. 30 tablet 0   Multiple Vitamin (MULTIVITAMIN WITH MINERALS)  TABS tablet Take 1 tablet by mouth daily. Centrum Silver     potassium chloride SA (KLOR-CON M) 20 MEQ tablet Take 40 mEq by mouth 2 (two) times daily.     Probiotic Product (PROBIOTIC PO) Take 1 capsule by mouth daily.     promethazine-dextromethorphan (PROMETHAZINE-DM) 6.25-15 MG/5ML syrup Take 5 mLs by mouth every 6 (six) hours as needed.     No current facility-administered medications on file prior to visit.    Allergies: No Known Allergies Past Medical History:  Past Medical History:  Diagnosis Date   Cancer ([place])    endometrial   Headache    patient denies this dx on 10/06/20   History of radiation therapy 03/02/17-03/16/17   vaginal cuff treated to 18 Gy with 3 fractions of 6 Gy   Localized swelling of both lower legs    wears compression hose   Phlebitis alone left leg  4-5 yrs ago   Past Surgical History:  Past Surgical History:  Procedure Laterality Date   APPLICATION OF CRANIAL NAVIGATION N/A 10/08/2020   Procedure: APPLICATION OF CRANIAL NAVIGATION;  Surgeon: Vallarie Mare, MD;  Location: [facility];  Service: Neurosurgery;  Laterality: N/A;   CRANIOTOMY N/A 10/08/2020   Procedure: CRANIOTOMY - Suboccipital TUMOR EXCISION - Brain Lab;  Surgeon: Vallarie Mare, MD;  Location: [facility];  Service: Neurosurgery;  Laterality: N/A;  suboccipital   ROBOTIC ASSISTED TOTAL HYSTERECTOMY WITH BILATERAL SALPINGO OOPHERECTOMY N/A 11/18/2016   Procedure: XI ROBOTIC ASSISTED TOTAL HYSTERECTOMY WITH BILATERAL SALPINGO OOPHORECTOMY;  Surgeon: Everitt Amber, MD;  Location: [facility];  Service: Gynecology;  Laterality: N/A;  SENTINEL NODE BIOPSY N/A 11/18/2016   Procedure: SENTINEL NODE BIOPSY;  Surgeon: Everitt Amber, MD;  Location: [facility];  Service: Gynecology;  Laterality: N/A;   Social History:  Social History   Socioeconomic History   Marital status: Single    Spouse name: Not on file   Number of children: 0   Years of education: Not on file   Highest education level: Not on file   Occupational History   Occupation: sewer  Tobacco Use   Smoking status: Never   Smokeless tobacco: Never  Vaping Use   Vaping Use: Never used  Substance and Sexual Activity   Alcohol use: No   Drug use: No   Sexual activity: Not Currently    Birth control/protection: Surgical    Comment: Hysterectomy  Other Topics Concern   Not on file  Social History Narrative   Not on file   Social Determinants of Health   Financial Resource Strain: Not on file  Food Insecurity: Not on file  Transportation Needs: Not on file  Physical Activity: Not on file  Stress: Not on file  Social Connections: Not on file  Intimate Partner Violence: Not on file   Family History:  Family History  Problem Relation Age of Onset   Lung cancer Father    Breast cancer Sister     Review of Systems: Constitutional: Doesn't report fevers, chills or abnormal weight loss Eyes: Doesn't report blurriness of vision Ears, nose, mouth, throat, and face: Doesn't report sore throat Respiratory: Doesn't report cough, dyspnea or wheezes Cardiovascular: Doesn't report palpitation, chest discomfort  Gastrointestinal:  Doesn't report nausea, constipation, diarrhea GU: Doesn't report incontinence Skin: Doesn't report skin rashes Neurological: Per HPI Musculoskeletal: Doesn't report joint pain Behavioral/Psych: Doesn't report anxiety  Physical Exam: Vitals:   04/01/22 0930  BP: 131/61  Pulse: 99  Resp: 20  Temp: 97.7 F (36.5 C)  SpO2: 98%    KPS: 80. General: Alert, cooperative, pleasant, in no acute distress Head: Normal EENT: No conjunctival injection or scleral icterus.  Lungs: Resp effort normal Cardiac: Regular rate Abdomen: Non-distended abdomen Skin: No rashes cyanosis or petechiae. Extremities: No clubbing or edema  Neurologic Exam: Mental Status: Awake, alert, attentive to examiner. Oriented to self and environment. Language is fluent with intact comprehension.  Age advanced psychomotor  slowing. Cranial Nerves: Visual acuity is grossly normal. Visual fields are full. Extra-ocular movements intact. No ptosis. Face is symmetric Motor: Tone and bulk are normal. Power is full in both arms and legs. Reflexes are symmetric, no pathologic reflexes present.  Sensory: Intact to light touch Gait: Normal.   Labs: I have reviewed the data as listed    Component Value Date/Time   NA 139 03/05/2022 0000   NA 139 11/03/2016 1023   K 3.4 (A) 03/05/2022 0000   K 3.8 11/03/2016 1023   CL 100 03/05/2022 0000   CO2 28 (A) 03/05/2022 0000   CO2 26 11/03/2016 1023   GLUCOSE 167 (H) 10/09/2020 0626   GLUCOSE 134 11/03/2016 1023   BUN 10 03/05/2022 0000   BUN 11.6 11/03/2016 1023   CREATININE 0.5 03/05/2022 0000   CREATININE 0.61 10/09/2020 0626   CREATININE 0.8 11/03/2016 1023   CALCIUM 9.3 03/05/2022 0000   CALCIUM 9.6 11/03/2016 1023   PROT 7.8 11/03/2016 1023   ALBUMIN 4.2 12/10/2021 0000   ALBUMIN 3.5 11/03/2016 1023   AST 26 03/05/2022 0000   AST 24 11/03/2016 1023   ALT 21 03/05/2022 0000   ALT 17  11/03/2016 1023   ALKPHOS 69 03/05/2022 0000   ALKPHOS 78 11/03/2016 1023   BILITOT 0.62 11/03/2016 1023   GFRNONAA >60 10/09/2020 0626   GFRAA >60 08/13/2018 0236   Lab Results  Component Value Date   WBC 4.0 03/05/2022   NEUTROABS 2.40 03/05/2022   HGB 11.8 (A) 03/05/2022   HCT 36 03/05/2022   MCV 90 02/10/2021   PLT 261 03/05/2022    Imaging:  [place] Clinician Interpretation: I have personally reviewed the CNS images as listed.  My interpretation, in the context of the patient's clinical presentation, is stable disease  MR BRAIN W WO CONTRAST  Result Date: 03/30/2022 CLINICAL DATA:  Provided history: Malignant neoplasm metastatic to brain. Brain/CNS neoplasm, assess treatment response. EXAM: MRI HEAD WITHOUT AND WITH CONTRAST TECHNIQUE: Multiplanar, multiecho pulse sequences of the brain and surrounding structures were obtained without and with intravenous contrast.  CONTRAST:  71m MULTIHANCE GADOBENATE DIMEGLUMINE 529 MG/ML IV SOLN COMPARISON:  Prior brain MRI examinations 10/19/2021 and earlier. FINDINGS: Brain: Mild generalized parenchymal atrophy. Sequela of prior right suboccipital craniotomy and right cerebellar mass resection. Unchanged mild non-masslike enhancement, and small amount of chronic blood products, at the resection site. No new enhancing intracranial lesions are identified. Multifocal T2 FLAIR hyperintense signal abnormality within the cerebral white matter, nonspecific but compatible with moderate chronic small vessel ischemic disease. There is no acute infarct. No extra-axial fluid collection. No midline shift. Vascular: Maintained flow voids within the proximal large arterial vessels. Skull and upper cervical spine: Right suboccipital cranioplasty. No focal suspicious marrow lesion. Incompletely assessed cervical spondylosis. Sinuses/Orbits: No mass or acute finding within the imaged orbits. Tiny mucous retention cyst within the right maxillary sinus. Other: Trace fluid within the right mastoid air cells. IMPRESSION: Stable post-treatment changes within the right cerebellar hemisphere. No new intracranial metastasis is identified. Moderate chronic small vessel ischemic disease within the cerebral white matter, stable. Electronically Signed   By: KKellie SimmeringD.O.   On: 03/30/2022 18:10     Assessment/Plan 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[facility] [C79.31]  SPenni Bombardis clinically and radiographically stable today.  No new or progressive issues today.  Ok with PRN Ibuprofen '800mg'$  for breakthrough/severe headaches.   We appreciate the opportunity to participate in the care of SLaiklyn Pilkenton  We ask that SFaiza Bansalreturn to clinic in 6 months following next brain MRI, or sooner as needed.  All questions were answered. The patient knows to call the clinic with any problems, questions or concerns. No  barriers to learning were detected.  The total time spent in the encounter was 30 minutes and more than 50% was on counseling and review of test results   ZVentura Sellers MD Medical Director of Neuro-Oncology CNaz[facility] Hospitalat WKanorado05/25/23 9:30 AM

## 2022-04-14 ENCOUNTER — Other Ambulatory Visit: Payer: Medicare Other

## 2022-04-15 ENCOUNTER — Other Ambulatory Visit: Payer: Medicare Other

## 2022-04-19 ENCOUNTER — Ambulatory Visit: Payer: Medicare Other | Admitting: Internal Medicine

## 2022-04-19 ENCOUNTER — Encounter: Payer: Self-pay | Admitting: Cardiology

## 2022-04-19 NOTE — Progress Notes (Signed)
Cardiology Office Note   Date:  04/20/2022   ID:  Brianna Herring, DOB 08-28-1956, MRN [id number]  PCP:  Street, [name], MD  Cardiologist:   None Referring:  Street, [name], MD  Chief Complaint  Patient presents with   Chest Pain      History of Present Illness: Brianna Herring is a 66 y.o. female who presents for evaluation of chest pain.  She has no past cardiac history.  She was in hospital on 6 8.  I was able to review some of these records.  She had chest discomfort.  However, when I talked to her she says it really was not chest discomfort.  She said she had some left arm and left-sided numbness.  This it started a few days before.  It was at rest.  There was some left-sided discomfort under her axilla.  She went to the emergency room and I do not see any enzymes drawn.  I do see that she had a CT ordered.  There were some bilateral small pulmonary nodules.  She had aortic atherosclerosis.  There was no evidence of pulmonary embolism.  An EKG was and suggested no acute ST segment elevation although there was some baseline artifact but it was sinus tachycardia.  She said that she did have some nausea and shortness of breath when her symptoms first started.  She stated all went away eventually over the weekend.  Prior to that the numbness and left arm weakness that she was describing came and went.  It would last for minutes or hours at a time.  She was not describing any visual disturbance.  She was not having any other problems with motor or speech.  She has not had any other cardiovascular work-up or history previously.  She is not particularly active but she does her chores of daily living without bringing on any symptoms.   She has otherwise been well.   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[place])    History of radiation therapy 03/02/17-03/16/17   vaginal cuff treated to 18 Gy with 3 fractions of 6 Gy   Hyperglycemia    Labile blood pressure    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   Morbid obesity ([place])    Multiple lung nodules on CT 03/14/2018   CT 11/15/16  MPN's largest 6 mm - PET 12/15/16 neg but largest < 16m - CT chest 01/31/18  Largest ?RML proximal/central  @ 1.8 x 1.5 and LLL peripheral cavitary x  1.5 x1.4  - PET 03/22/18 :  Two distinct nodules, def growing since 01/31/18 both hypermetabolic    Phlebitis alone left leg  4-5 yrs ago   Pulmonary embolism on right (HCC)    Redness and swelling of lower leg    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    Past Surgical History:  Procedure Laterality Date   APPLICATION OF CRANIAL NAVIGATION N/A 10/08/2020   Procedure: APPLICATION OF  CRANIAL NAVIGATION;  Surgeon: Vallarie Mare, MD;  Location: [facility];  Service: Neurosurgery;  Laterality: N/A;   CRANIOTOMY N/A 10/08/2020   Procedure: CRANIOTOMY - Suboccipital TUMOR EXCISION - Brain Lab;  Surgeon: Vallarie Mare, MD;  Location: [facility];  Service: Neurosurgery;  Laterality: N/A;  suboccipital   ROBOTIC ASSISTED TOTAL HYSTERECTOMY WITH BILATERAL SALPINGO OOPHERECTOMY N/A 11/18/2016   Procedure: XI ROBOTIC ASSISTED TOTAL HYSTERECTOMY WITH BILATERAL SALPINGO OOPHORECTOMY;  Surgeon: Everitt Amber, MD;  Location[facility];  Service: Gynecology;  Laterality: N/A;   SENTINEL NODE BIOPSY N/A 11/18/2016   Procedure: SENTINEL NODE BIOPSY;  Surgeon: Everitt Amber, MD;  Location: [facility];  Service: Gynecology;  Laterality:  N/A;     Current Outpatient Medications  Medication Sig Dispense Refill   acetaminophen (TYLENOL) 500 MG tablet Take 500 mg by mouth every 6 (six) hours as needed for pain or headache.     aspirin EC 81 MG tablet Take 81 mg by mouth daily. Swallow whole.     Calcium Carbonate (CALTRATE 600 PO) Take 1 tablet by mouth in the morning and at bedtime.     meclizine (ANTIVERT) 25 MG tablet Take 1 tablet (25 mg total) by mouth 3 (three) times daily as needed for dizziness. 30 tablet 0   metoprolol succinate (TOPROL-XL) 25 MG 24 hr tablet Take 25 mg by mouth daily.     metoprolol tartrate (LOPRESSOR) 100 MG tablet Take 1 tablet (145m) TWO hours prior to CT scan 1 tablet 0   Multiple Vitamin (MULTIVITAMIN WITH MINERALS) TABS tablet Take 1 tablet by mouth daily. Centrum Silver     potassium chloride SA (KLOR-CON M) 20 MEQ tablet Take 60 mEq by mouth 2 (two) times daily.     Probiotic Product (PROBIOTIC PO) Take 1 capsule by mouth daily.     No current facility-administered medications for this visit.    Allergies:   Patient has no known allergies.    Social History:  The patient  reports that she has never smoked. She has never used smokeless tobacco. She reports that she does not drink alcohol and does not use drugs.   Family History:  The patient's family history includes Atrial fibrillation in her mother; Breast cancer in her sister; Heart failure in her sister; Lung cancer in her father.    ROS:  Please see the history of present illness.   Otherwise, review of systems are positive for none.   All other systems are reviewed and negative.    PHYSICAL EXAM: VS:  BP 138/79   Pulse (!) 101   Ht '5\' 6"'  (1.676 m)   Wt 214 lb (97.1 kg)   LMP  (LMP Unknown)   SpO2 96%   BMI 34.54 kg/m  , BMI Body mass index is 34.54 kg/m. GENERAL:  Well appearing HEENT:  Pupils equal round and reactive, fundi not visualized, oral mucosa unremarkable NECK:  No jugular venous distention, waveform within  normal limits, carotid upstroke brisk and symmetric, no bruits, no thyromegaly LYMPHATICS:  No cervical, inguinal adenopathy LUNGS:  Clear to auscultation bilaterally BACK:  No CVA tenderness CHEST:  Unremarkable HEART:  PMI not displaced or sustained,S1 and S2 within normal limits, no S3, no S4, no clicks, no rubs, no murmurs ABD:  Flat, positive bowel sounds normal in frequency in pitch, no bruits, no rebound, no guarding, no midline pulsatile mass, no hepatomegaly, no splenomegaly EXT:  2 plus pulses throughout, no edema, no cyanosis no clubbing SKIN:  No rashes no  nodules NEURO:  Cranial nerves II through XII grossly intact, motor grossly intact throughout PSYCH:  Cognitively intact, oriented to person place and time    EKG:  EKG is not ordered today. The ekg ordered today demonstrates sinus rhythm, rate 101, axis within normal limits, intervals within normal limits, inferior and lateral diffuse T wave inversions.   Recent Labs: 03/05/2022: ALT 21; BUN 10; Creatinine 0.5; Hemoglobin 11.8; Platelets 261; Potassium 3.4; Sodium 139    Lipid Panel No results found for: "CHOL", "TRIG", "HDL", "CHOLHDL", "VLDL", "LDLCALC", "LDLDIRECT"    Wt Readings from Last 3 Encounters:  04/20/22 214 lb (97.1 kg)  04/01/22 220 lb 14.4 oz (100.2 kg)  03/08/22 220 lb 11.2 oz (100.1 kg)      Other studies Reviewed: Additional studies/ records that were reviewed today include: None. Review of the above records demonstrates:  Please see elsewhere in the note.     ASSESSMENT AND PLAN:  CHEST PAIN: She describes some vague symptoms including some left arm discomfort.  She does have some abnormalities on her EKG.  I will order a coronary CTA to rule out obstructive coronary disease.  RISK STRATIFICATION: I do not see any recent labs and she says she has not had any to include lipid profile, TSH and A1c and I will order this.   Current medicines are reviewed at length with the patient today.  The  patient does not have concerns regarding medicines.  The following changes have been made:  no change  Labs/ tests ordered today include:   Orders Placed This Encounter  Procedures   CT CORONARY MORPH W/CTA COR W/SCORE W/CA W/CM &/OR WO/CM   Basic metabolic panel   EKG 10-YPEJ     Disposition:   FU with me after the CT.   Signed, Minus Breeding, MD  04/20/2022 5:31 PM    [place]

## 2022-04-20 ENCOUNTER — Encounter: Payer: Self-pay | Admitting: Cardiology

## 2022-04-20 ENCOUNTER — Ambulatory Visit (INDEPENDENT_AMBULATORY_CARE_PROVIDER_SITE_OTHER): Payer: Medicare Other | Admitting: Cardiology

## 2022-04-20 ENCOUNTER — Other Ambulatory Visit: Payer: Self-pay

## 2022-04-20 VITALS — BP 138/79 | HR 101 | Ht 66.0 in | Wt 214.0 lb

## 2022-04-20 DIAGNOSIS — R072 Precordial pain: Secondary | ICD-10-CM

## 2022-04-20 DIAGNOSIS — R0989 Other specified symptoms and signs involving the circulatory and respiratory systems: Secondary | ICD-10-CM | POA: Insufficient documentation

## 2022-04-20 DIAGNOSIS — I700 Atherosclerosis of aorta: Secondary | ICD-10-CM | POA: Insufficient documentation

## 2022-04-20 DIAGNOSIS — Z01812 Encounter for preprocedural laboratory examination: Secondary | ICD-10-CM | POA: Diagnosis not present

## 2022-04-20 DIAGNOSIS — C55 Malignant neoplasm of uterus, part unspecified: Secondary | ICD-10-CM | POA: Insufficient documentation

## 2022-04-20 DIAGNOSIS — I25119 Atherosclerotic heart disease of native coronary artery with unspecified angina pectoris: Secondary | ICD-10-CM | POA: Insufficient documentation

## 2022-04-20 DIAGNOSIS — R739 Hyperglycemia, unspecified: Secondary | ICD-10-CM | POA: Insufficient documentation

## 2022-04-20 DIAGNOSIS — M7989 Other specified soft tissue disorders: Secondary | ICD-10-CM | POA: Insufficient documentation

## 2022-04-20 DIAGNOSIS — D638 Anemia in other chronic diseases classified elsewhere: Secondary | ICD-10-CM | POA: Insufficient documentation

## 2022-04-20 DIAGNOSIS — Z923 Personal history of irradiation: Secondary | ICD-10-CM | POA: Insufficient documentation

## 2022-04-20 DIAGNOSIS — D6869 Other thrombophilia: Secondary | ICD-10-CM | POA: Insufficient documentation

## 2022-04-20 DIAGNOSIS — C7800 Secondary malignant neoplasm of unspecified lung: Secondary | ICD-10-CM | POA: Insufficient documentation

## 2022-04-20 DIAGNOSIS — C801 Malignant (primary) neoplasm, unspecified: Secondary | ICD-10-CM | POA: Insufficient documentation

## 2022-04-20 DIAGNOSIS — I2699 Other pulmonary embolism without acute cor pulmonale: Secondary | ICD-10-CM | POA: Insufficient documentation

## 2022-04-20 DIAGNOSIS — Z6834 Body mass index (BMI) 34.0-34.9, adult: Secondary | ICD-10-CM | POA: Insufficient documentation

## 2022-04-20 DIAGNOSIS — I809 Phlebitis and thrombophlebitis of unspecified site: Secondary | ICD-10-CM | POA: Insufficient documentation

## 2022-04-20 DIAGNOSIS — R238 Other skin changes: Secondary | ICD-10-CM | POA: Insufficient documentation

## 2022-04-20 MED ORDER — METOPROLOL TARTRATE 100 MG PO TABS: ORAL_TABLET | ORAL | 0 refills | Status: DC

## 2022-04-20 NOTE — Patient Instructions (Addendum)
Medication Instructions:  Your physician recommends that you continue on your current medications as directed. Please refer to the Current Medication list given to you today.  *If you need a refill on your cardiac medications before your next appointment, please call your pharmacy*   Lab Work: BMET today  If you have labs (blood work) drawn today and your tests are completely normal, you will receive your results only by: [place] (if you have MyChart) OR A paper copy in the mail If you have any lab test that is abnormal or we need to change your treatment, we will call you to review the results.   Testing/Procedures: Coronary CTA-see instructions below  Follow-Up: At [facility], you and your health needs are our priority.  As part of our continuing mission to provide you with exceptional heart care, we have created designated Provider Care Teams.  These Care Teams include your primary Cardiologist (physician) and Advanced Practice Providers (APPs -  Physician Assistants and Nurse Practitioners) who all work together to provide you with the care you need, when you need it.  We recommend signing up for the patient portal called "MyChart".  Sign up information is provided on this After Visit Summary.  MyChart is used to connect with patients for Virtual Visits (Telemedicine).  Patients are able to view lab/test results, encounter notes, upcoming appointments, etc.  Non-urgent messages can be sent to your provider as well.   To learn more about what you can do with MyChart, go to [email or web address].    Your next appointment:   4-6 weeks with Dr. Percival Spanish   Other Instructions   Your cardiac CT will be scheduled at one of the below locations:   [facility] [address] [place], [place] [address] [phone]  If scheduled at [facility], please arrive at the [facility] and Children's Entrance (Entrance C2) of [facility] 30 minutes prior  to test start time. You can use the FREE valet parking offered at entrance C (encouraged to control the heart rate for the test)  Proceed to the [facility] Radiology Department (first floor) to check-in and test prep.  All radiology patients and guests should use entrance C2 at [facility], accessed from [facility], even though the hospital's physical address listed is [address]     Please follow these instructions carefully (unless otherwise directed):  On the Night Before the Test: Be sure to Drink plenty of water. Do not consume any caffeinated/decaffeinated beverages or chocolate 12 hours prior to your test. Do not take any antihistamines 12 hours prior to your test.  On the Day of the Test: Drink plenty of water until 1 hour prior to the test. Do not eat any food 4 hours prior to the test. You may take your regular medications prior to the test.  Take metoprolol (Lopressor) two hours prior to test. FEMALES- please wear underwire-free bra if available, avoid dresses & tight clothing  After the Test: Drink plenty of water. After receiving IV contrast, you may experience a mild flushed feeling. This is normal. On occasion, you may experience a mild rash up to 24 hours after the test. This is not dangerous. If this occurs, you can take Benadryl 25 mg and increase your fluid intake. If you experience trouble breathing, this can be serious. If it is severe call 911 IMMEDIATELY. If it is mild, please call our office. If you take any of these medications: Glipizide/Metformin, Avandament, Glucavance,  please do not take 48 hours after completing test unless otherwise instructed.  We will call to schedule your test 2-4 weeks out understanding that some insurance companies will need an authorization prior to the service being performed.   For non-scheduling related questions, please contact the cardiac imaging nurse navigator should you have any  questions/concerns: Marchia Bond, Cardiac Imaging Nurse Navigator Gordy Clement, Cardiac Imaging Nurse Navigator [facility] Direct Office Dial: [phone]   For scheduling needs, including cancellations and rescheduling, please call [place], [phone].   Important Information About [place]

## 2022-04-21 LAB — BASIC METABOLIC PANEL
BUN/Creatinine Ratio: 23 (ref 12–28)
BUN: 16 mg/dL (ref 8–27)
CO2: 24 mmol/L (ref 20–29)
Calcium: 9.8 mg/dL (ref 8.7–10.3)
Chloride: 100 mmol/L (ref 96–106)
Creatinine, Ser: 0.69 mg/dL (ref 0.57–1.00)
Glucose: 93 mg/dL (ref 70–99)
Potassium: 4.1 mmol/L (ref 3.5–5.2)
Sodium: 139 mmol/L (ref 134–144)
eGFR: 96 mL/min/{1.73_m2} (ref >59)

## 2022-04-22 ENCOUNTER — Other Ambulatory Visit: Payer: Self-pay | Admitting: Radiation Therapy

## 2022-05-13 ENCOUNTER — Ambulatory Visit: Payer: Medicare Other | Admitting: Cardiology

## 2022-05-14 ENCOUNTER — Telehealth (HOSPITAL_COMMUNITY): Payer: Self-pay | Admitting: Emergency Medicine

## 2022-05-14 NOTE — Telephone Encounter (Signed)
Attempted to call patient regarding upcoming cardiac CT appointment. °Left message on voicemail with name and callback number °Schuyler Olden RN Navigator Cardiac Imaging °[place] [facility] °[phone] Office °[phone] Cell ° °

## 2022-05-17 ENCOUNTER — Other Ambulatory Visit: Payer: Self-pay | Admitting: *Deleted

## 2022-05-17 ENCOUNTER — Other Ambulatory Visit: Payer: Medicare Other

## 2022-05-17 ENCOUNTER — Other Ambulatory Visit (HOSPITAL_COMMUNITY): Payer: Medicare Other

## 2022-05-17 ENCOUNTER — Ambulatory Visit (HOSPITAL_COMMUNITY)
Admission: RE | Admit: 2022-05-17 | Discharge: 2022-05-17 | Disposition: A | Discharge status: Home / Self Care | Payer: Medicare Other | Source: Ambulatory Visit | Attending: Cardiology | Admitting: Cardiology

## 2022-05-17 DIAGNOSIS — R072 Precordial pain: Secondary | ICD-10-CM | POA: Insufficient documentation

## 2022-05-17 DIAGNOSIS — R739 Hyperglycemia, unspecified: Secondary | ICD-10-CM

## 2022-05-17 DIAGNOSIS — R0989 Other specified symptoms and signs involving the circulatory and respiratory systems: Secondary | ICD-10-CM

## 2022-05-17 MED ORDER — METOPROLOL TARTRATE 5 MG/5ML IV SOLN
5.0000 mg | INTRAVENOUS | Status: DC | PRN
Start: 2022-05-17 — End: 2022-05-18
  Administered 2022-05-17 (×3): 5 mg via INTRAVENOUS

## 2022-05-17 MED ORDER — DILTIAZEM HCL 25 MG/5ML IV SOLN
INTRAVENOUS | Status: AC
  Filled 2022-05-17: qty 5

## 2022-05-17 MED ORDER — NITROGLYCERIN 0.4 MG SL SUBL: 0.8000 mg | SUBLINGUAL_TABLET | Freq: Once | SUBLINGUAL | Status: DC

## 2022-05-17 MED ORDER — DILTIAZEM HCL 25 MG/5ML IV SOLN
20.0000 mg | Freq: Once | INTRAVENOUS | Status: AC
  Administered 2022-05-17: 5 mg via INTRAVENOUS

## 2022-05-17 MED ORDER — METOPROLOL TARTRATE 5 MG/5ML IV SOLN
INTRAVENOUS | Status: AC
  Filled 2022-05-17: qty 15

## 2022-05-17 NOTE — Progress Notes (Signed)
Scan aborted at this time after speaking with Dr. Radford Pax due to elevated heart rate. Patient was 91 NSR on cardiac monitor upon arrival. Patient was given 15 mg of IV lopressor and 5 mg of diltiazem and HR only decreased to 74. Goal was lower 60s. Dr. Radford Pax to reach out to Dr. Percival Spanish and discuss the next steps for this patient. IV removed and patient ambulatory at discharge

## 2022-05-18 LAB — LIPID PANEL
Chol/HDL Ratio: 2.4 ratio (ref 0.0–4.4)
Cholesterol, Total: 127 mg/dL (ref 100–199)
HDL: 54 mg/dL (ref >39)
LDL Chol Calc (NIH): 60 mg/dL (ref 0–99)
Triglycerides: 61 mg/dL (ref 0–149)
VLDL Cholesterol Cal: 13 mg/dL (ref 5–40)

## 2022-05-18 LAB — HEMOGLOBIN A1C
Est. average glucose Bld gHb Est-mCnc: 146 mg/dL
Hgb A1c MFr Bld: 6.7 % — ABNORMAL HIGH (ref 4.8–5.6)

## 2022-05-18 LAB — TSH: TSH: 1.37 u[IU]/mL (ref 0.450–4.500)

## 2022-05-19 ENCOUNTER — Telehealth: Payer: Self-pay | Admitting: Cardiology

## 2022-05-19 NOTE — Telephone Encounter (Signed)
LMTCB

## 2022-05-19 NOTE — Telephone Encounter (Signed)
Paitent's sister is requesting to speak with Dr. Rosezella [place] nurse regarding CT and how soon the patient needs to have it done. Please advise.

## 2022-05-20 NOTE — Telephone Encounter (Signed)
Sister of patient returning call from Judson Roch. Please call Sister

## 2022-05-20 NOTE — Telephone Encounter (Signed)
Spoke to patient's sister Ava she stated patient was unable to have coronary ct on Monday 7/10 due to elevated heart rate.She stated they would be receiving a call with a new appointment.Also a new medication would be sent to pharmacy to take before ct.Message sent to Craig Guess at [facility] radiology to reschedule patient's coronary ct.

## 2022-05-21 ENCOUNTER — Other Ambulatory Visit (HOSPITAL_COMMUNITY): Payer: Self-pay

## 2022-05-21 ENCOUNTER — Telehealth (HOSPITAL_COMMUNITY): Payer: Self-pay | Admitting: Emergency Medicine

## 2022-05-21 ENCOUNTER — Ambulatory Visit: Payer: Medicare Other | Admitting: Cardiology

## 2022-05-21 DIAGNOSIS — R072 Precordial pain: Secondary | ICD-10-CM

## 2022-05-21 MED ORDER — IVABRADINE HCL 5 MG PO TABS
15.0000 mg | ORAL_TABLET | Freq: Once | ORAL | 0 refills | Status: AC
  Filled 2022-05-21: qty 3, 1d supply, fill #0

## 2022-05-21 MED ORDER — METOPROLOL TARTRATE 100 MG PO TABS
ORAL_TABLET | ORAL | 0 refills | Status: DC
  Filled 2022-05-21: qty 1, 1d supply, fill #0

## 2022-05-21 NOTE — Telephone Encounter (Signed)
Pts CCTA has been r/s for 8/1  '100mg'$  metoprolol and '15mg'$  ivabradine has been sent to [facility]   Will call closer to appt to review CCTA instructions  Marchia Bond RN Navigator Cardiac [facility] [phone] Office  [phone] Cell

## 2022-05-28 ENCOUNTER — Other Ambulatory Visit (HOSPITAL_COMMUNITY): Payer: Self-pay

## 2022-06-07 ENCOUNTER — Telehealth (HOSPITAL_COMMUNITY): Payer: Self-pay | Admitting: *Deleted

## 2022-06-07 ENCOUNTER — Telehealth: Payer: Self-pay | Admitting: Cardiology

## 2022-06-07 NOTE — Telephone Encounter (Signed)
Reaching out to patient to offer assistance regarding upcoming cardiac imaging study; pt verbalizes understanding of appt date/time, parking situation and where to check in, pre-test NPO status and medications ordered, name and call back number provided for further questions should they arise  Gordy Clement RN Navigator Cardiac Imaging [facility] [phone] office [phone] cell  Patient to take '100mg'$  metoprolol tartrate and '15mg'$  ivabradine two hours prior to her cardiac CT scan. She states she has a port but is willing to get an IV for the test. She is aware to arrive at 2:30pm.

## 2022-06-07 NOTE — Telephone Encounter (Signed)
STAT if HR is under 50 or over 120 (normal HR is 60-100 beats per minute)  What is your heart rate? 121  Do you have a log of your heart rate readings (document readings)? No  Do you have any other symptoms? No

## 2022-06-07 NOTE — Telephone Encounter (Signed)
Spoke with patient who reported elevated pulse when ambulating. She handed off the phone to her sister. Sister reports patient's heart rate 110-120 with ambulation; BP 106/78. Denies dizziness and no worsening of established sob. Patient went to urgent care this past Wednesday for left leg edema. She saw PCP Dr. Venetia Maxon on Friday who ordered scan for blood clots (neg). She was placed on torsemide '10mg'$  daily and K+ 160mq daily. The leg edema is a bit reduced. Patient elevates leg and wears support stocking. Patient scheduled for CCTA tomorrow per Dr. SVenetia Maxon Spoke with AMuseum/gallery conservatorat Dr. SEbony Hailoffice and requested 06/04/22 OV notes faxed to Dr. HPercival Spanish

## 2022-06-08 ENCOUNTER — Encounter (HOSPITAL_COMMUNITY): Payer: Self-pay

## 2022-06-08 ENCOUNTER — Ambulatory Visit (HOSPITAL_COMMUNITY)
Admission: RE | Admit: 2022-06-08 | Discharge: 2022-06-08 | Disposition: A | Discharge status: Home / Self Care | Payer: Medicare Other | Source: Ambulatory Visit | Attending: Cardiology | Admitting: Cardiology

## 2022-06-08 NOTE — Progress Notes (Signed)
Patient came in for the second attempt at a CT heart. Patient's heart rate is 90 with a blood pressure of 110/60. Rhythm is normal sinus. Last attempted patient was given IV lopressor and cardizem and didn't respond enough for scan.  Patient took 100 mg PO Lopressor and 15 mg PO Ivabradine prior today.  Patient's sister stated that prior today patient's heart rate was in the 120's.    Spoke with Dr. Stanford Breed who recommended giving 5 mg Lopressor IV and see if patient responds and abort if not.  Patient and sister discussed having IV started for attempt at brining heart rate down with the same medication as previous and they decided that is not what they would like to do at this time. They would like Dr. Percival Spanish to figure out a different test. Patient reported that she feels bad today and hasn't eaten much and would like a different test with less medications if possible.  Notified CT and nurse navigators of cancellation.  Dr. Stanford Breed notified as well.   Nothing further needed at this time.

## 2022-06-09 ENCOUNTER — Inpatient Hospital Stay: Payer: Medicare Other

## 2022-06-09 ENCOUNTER — Inpatient Hospital Stay: Payer: Medicare Other | Attending: Internal Medicine | Admitting: Oncology

## 2022-06-09 ENCOUNTER — Other Ambulatory Visit: Payer: Self-pay | Admitting: Oncology

## 2022-06-09 ENCOUNTER — Encounter: Payer: Self-pay | Admitting: Oncology

## 2022-06-09 VITALS — BP 132/63 | HR 108 | Temp 99.1°F | Resp 19 | Ht 66.0 in | Wt 203.9 lb

## 2022-06-09 DIAGNOSIS — C541 Malignant neoplasm of endometrium: Secondary | ICD-10-CM | POA: Diagnosis present

## 2022-06-09 DIAGNOSIS — C7931 Secondary malignant neoplasm of brain: Secondary | ICD-10-CM | POA: Insufficient documentation

## 2022-06-09 DIAGNOSIS — Z90710 Acquired absence of both cervix and uterus: Secondary | ICD-10-CM | POA: Diagnosis not present

## 2022-06-09 DIAGNOSIS — C7801 Secondary malignant neoplasm of right lung: Secondary | ICD-10-CM | POA: Diagnosis not present

## 2022-06-09 DIAGNOSIS — Z86711 Personal history of pulmonary embolism: Secondary | ICD-10-CM | POA: Diagnosis not present

## 2022-06-09 DIAGNOSIS — Z79899 Other long term (current) drug therapy: Secondary | ICD-10-CM | POA: Diagnosis not present

## 2022-06-09 DIAGNOSIS — Z9079 Acquired absence of other genital organ(s): Secondary | ICD-10-CM | POA: Insufficient documentation

## 2022-06-09 DIAGNOSIS — D4621 Refractory anemia with excess of blasts 1: Secondary | ICD-10-CM | POA: Diagnosis not present

## 2022-06-09 DIAGNOSIS — Z923 Personal history of irradiation: Secondary | ICD-10-CM | POA: Diagnosis not present

## 2022-06-09 DIAGNOSIS — Z90722 Acquired absence of ovaries, bilateral: Secondary | ICD-10-CM | POA: Insufficient documentation

## 2022-06-09 DIAGNOSIS — C7802 Secondary malignant neoplasm of left lung: Secondary | ICD-10-CM | POA: Insufficient documentation

## 2022-06-09 DIAGNOSIS — R350 Frequency of micturition: Secondary | ICD-10-CM

## 2022-06-09 DIAGNOSIS — D469 Myelodysplastic syndrome, unspecified: Secondary | ICD-10-CM

## 2022-06-09 DIAGNOSIS — D649 Anemia, unspecified: Secondary | ICD-10-CM

## 2022-06-09 DIAGNOSIS — R509 Fever, unspecified: Secondary | ICD-10-CM

## 2022-06-09 LAB — CBC AND DIFFERENTIAL
HCT: 26 — AB (ref 36–46)
Hemoglobin: 8.4 — AB (ref 12.0–16.0)
Neutrophils Absolute: 1.12
Platelets: 168 10*3/uL (ref 150–400)
WBC: 5.1

## 2022-06-09 LAB — COMPREHENSIVE METABOLIC PANEL
Albumin: 3.8 (ref 3.5–5.0)
Calcium: 9 (ref 8.7–10.7)

## 2022-06-09 LAB — HEPATIC FUNCTION PANEL
ALT: 22 U/L (ref 7–35)
AST: 32 (ref 13–35)
Alkaline Phosphatase: 85 (ref 25–125)
Bilirubin, Total: 0.8

## 2022-06-09 LAB — CBC: RBC: 3.01 — AB (ref 3.87–5.11)

## 2022-06-09 LAB — BASIC METABOLIC PANEL
BUN: 10 (ref 4–21)
CO2: 26 — AB (ref 13–22)
Chloride: 101 (ref 99–108)
Creatinine: 0.7 (ref 0.5–1.1)
Glucose: 120
Potassium: 4 mEq/L (ref 3.5–5.1)
Sodium: 136 — AB (ref 137–147)

## 2022-06-09 NOTE — Progress Notes (Signed)
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 female with recurrent endometrial carcinoma with lung metastasis.  She was originally diagnosed with stage IB (T1b N0 M0) high-grade endometrial carcinoma in late 2017. She had a robotic hysterectomy and bilateral salpingo-oophorectomy in January 2018.  Pathology revealed a 4 cm, grade 3, endometrioid adenocarcinoma with over 50% invasion of the myometrium and lymphovascular invasion.  Six lymph nodes were negative for metastasis.  Margins were clear.  She received adjuvant external beam radiation, completed in April 2018, followed by vaginal brachytherapy to the vaginal cuff.   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  MSI was high.  A referral to [facility] was ordered in 2018, but the patient never followed through.   CT chest, abdomen and pelvis prior to surgery revealed subtle low-attenuation in the central uterus, as well as small bibasilar pulmonary nodules, which were suspicious for metastatic disease, but only up to 6 mm in size.  PET scan in February 2018 did not reveal hypermetabolic activity in the lung nodules or in the left inguinal node or portacaval node.  There was mild uptake of the pelvic sidewall, which was felt to represent postoperative changes.   The patient was seen in routine follow-up in March 2019.  Repeat CT chest revealed new pulmonary nodules measuring up to 1.8 cm in the right middle lobe and not in the area of the prior tiny nodules, with a 1.5 cm left lower lobe cavitary nodule.  The previously seen changes of the pelvic sidewall on the prior PET scan had  resolved.  PET in May revealed hypermetabolic activity within the pulmonary nodules consistent with metastatic disease.  There was mild hypermetabolic activity within the portacaval node, which was still felt to be reactive.  We recommended 6 cycles of carboplatin and paclitaxel. 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  We did obtain MLH1 methylation analysis and there was hypermethylation of MLH1, so this does not likely represent Lynch syndrome. She received palliative carboplatin/paclitaxel from June to September 2019, completing 6 cycles.  Repeat CT imaging after 3 cycles of carboplatin/paclitaxel revealed a good response with a decrease in the size and number of pulmonary nodules. CT chest, abdomen and pelvis in October 2019 revealed stable to slightly decreased metastatic lung lesions with no other sites of metastatic disease.  Chemotherapy was discontinued and she was placed on tamoxifen 20 mg daily for maintenance.  Repeat CT scans in January 2020 revealed scattered small bilateral pulmonary nodules that were overall grossly unchanged.  CT imaging in April was fairly stable with just a slight increase in the right middle lobe pulmonary nodule.  Unfortunately, CT imaging in August revealed considerable enlargement of the right middle lobe pulmonary nodule from 1 cm to 2.3 cm consistent with progressive pulmonary metastasis.  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  [facility] CDx testing reveals MSI high with an increased TMB (  tumor mutational burden) making the patient eligible for immunotherapy. Tamoxifen was discontinued.  She was placed on palliative pembrolizumab every 3 weeks in October 2020.    She presented to the  emergency department with shortness of breath in November 2020.  CT angiogram chest revealed small filling defects in the right lower lobe and probably right upper lobe pulmonary arteries consistent with small pulmonary emboli. The metastatic lesion within the right middle lobe was stable at 3 cm.  There was scattered subsegmental atelectasis.  The patient was hospitalized at that time, as she presented with hypoxia.  She initially was given enoxaparin and then transitioned to rivaroxaban.After completion of 6 cycles of pembrolizumab, she had progressive disease within the chest.  CT chest, abdomen, and pelvis in February 2021 revealed a dramatic increase in the size of the dominant right middle lobe pulmonary nodule, now measuring 5.1 x 5.1 cm, previously measuring 2.4 x 2.3 cm.  The smaller pulmonary nodules in the right lower lobe were stable, but one new pulmonary nodule was seen within the central right middle lobe measuring 14 mm.  There was no evidence of local recurrence of endometrial carcinoma in the pelvis or evidence of metastatic disease in the abdomen or pelvis.  The bladder, kidneys and ureters appeared normal.  In light of these results, her treatment was changed to carboplatin/paclitaxel/bevacizumab beginning in February. She presented to the emergency department with vaginal bleeding in March.  This was felt to be hematuria due to urinary tract infection, and she was found to have Klebsiella pneumoniae in the urine and was discharged on [place].  She had anemia felt to be secondary to blood loss and her hemoglobin dropped to 8.8, but was back up to 9.5 at the time of discharge. CT chest angiogram did not reveal any evidence of pulmonary embolism.  There was an interval decrease in the pulmonary nodules.  The rivaroxaban was resumed on discharge.  Bevacizumab was permanently discontinued due to continued bleeding.  She saw Dr. Denman George for a vaginal exam in some time.  No vaginal mass or source of  bleeding was identified on vaginal examination.  She had recurrent bleeding, so Dr. Venetia Maxon had held her rivaroxaban.  She was found to have another urinary tract infection, this time with Staphylococcal hemolyticus, which was treated with nitrofurantoin.     CT chest, abdomen and pelvis  in June of 2021 revealed an excellent response to treatment.  The large right middle lobe mass had resolved, and the right lower lobe pulmonary nodules remain stable at 5 mm.  There was no evidence of metastatic disease in the abdomen or pelvis.  She had had right calf tenderness, redness and warmth and was seen by by Dr. Venetia Maxon.  Ultrasound was negative for DVT.  He felt this represented cellulitis, so treated her with cephalexin 500 mg.CT chest in September 2021 revealed stable small pulmonary nodules without evidence of metastatic disease within the abdomen and pelvis.  She was doing well at the time, so was scheduled for 3 month follow up with repeat imaging.  The patient presented in November 2021 with headache, nausea and vomiting, as well as dizziness and weight loss.  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 Pathology confirmed metastatic endometrial adenocarcinoma.  She did well postop and was tapered off dexamethasone. She continues to follow with Dr. Mickeal Skinner and MRI brain from September 2022 revealed stable post-surgical and post-treatment  appearance of the right cerebellar hemisphere. No new intracranial metastatic lesion is identified. CT chest, abdomen and pelvis from October 28th,2022 revealed no new evidence of metastatic disease in the chest, abdomen or pelvis, with stable 5 mm right lower lobe pulmonary nodules.  There is a right thyroid nodule stable with respect imaged portions less than 1.5 cm on previous imaging, and mild hepatic steatosis.  MRI of the brain in December with Dr. Mickeal Skinner remains stable.   Her last CA  125 in February remains normal.  INTERVAL HISTORY:  Brianna Herring is here for routine follow up and has greatly deteriorated. Her last MRI of the brain in June is stable and she continues to follow with Dr. Cecil Cobbs.  She had CT scans of chest, abdomen and pelvis on March 05, 2022 and this remains stable without any new or progressive findings suggesting recurrence.  She was seen in the ER in June and had a rib fracture of the left lower side. CT angio was negative. Since then, she saw Dr. Percival Spanish, cardiologist, for new CAD but was unable to do a CT chest due to her persistent tachycardia. She had edema of the left leg but ultrasound was negative for DVT. She was placed on a diuretic and metoprolol was increased. She still has stable scattered pulmonary nodules bilaterally which measure up to 5 mm in diameter. CBC reveals severe anemia with a hemoglobin of 8.4, a drop from 11.6 in June, with an MCV of 87. I will order an anemia evaluation.  CMP is unremarkable. Her  appetite is poor,and she has lost 10 pounds.  She denies fever, chills or other signs of infection.  She denies nausea, vomiting, bowel issues, or abdominal pain.  She denies sore throat, cough, dyspnea, or chest pain. I will check a urine for infection, and blood cultures. I will order CT scans of chest, abdomen and pelvis. I will see her back next week with repeat CBC and CMP. She did have a temperature to 100.6 yesterday and slight non-productive cough. She has had diarrhea yesterday but no melena. She has been nauseated.  REVIEW OF SYSTEMS:  Review of Systems  Constitutional: Negative.  Negative for appetite change, chills, fatigue, fever and unexpected weight change.  HENT:  Negative.    Eyes: Negative.   Respiratory: Negative.  Negative for chest tightness, cough, hemoptysis, shortness of breath and wheezing.   Cardiovascular: Negative.  Negative for chest pain, leg swelling and palpitations.  Gastrointestinal: Negative.   Negative for abdominal distention, abdominal pain, blood in stool, constipation, diarrhea, nausea and vomiting.  Endocrine: Negative.   Genitourinary: Negative.  Negative for difficulty urinating, dysuria, frequency and hematuria.   Musculoskeletal: Negative.  Negative for arthralgias, back pain, flank pain, gait problem and myalgias.  Skin: Negative.   Neurological:  Negative for extremity weakness, gait problem, headaches, light-headedness, numbness, seizures and speech difficulty.  Hematological: Negative.   Psychiatric/Behavioral: Negative.  Negative for depression and sleep disturbance. The patient is not nervous/anxious.      VITALS:  Blood pressure 132/63, pulse (!) 108, temperature 99.1 F (37.3 C), temperature source Oral, resp. rate 19, height '5\' 6"'  (1.676 m), weight 203 lb 14.4 oz (92.5 kg), SpO2 98 %.  Wt Readings from Last 3 Encounters:  07/02/22 193 lb (87.5 kg)  06/30/22 193 lb 9.6 oz (87.8 kg)  06/23/22 198 lb 3.2 oz (89.9 kg)    Body mass index is 32.91 kg/m.  Performance status (ECOG): 0 - Asymptomatic  PHYSICAL EXAM:  Physical  Exam Constitutional:      General: She is not in acute distress.    Appearance: Normal appearance. She is normal weight.  HENT:     Head: Normocephalic and atraumatic.  Eyes:     General: No scleral icterus.    Extraocular Movements: Extraocular movements intact.     Conjunctiva/sclera: Conjunctivae normal.     Pupils: Pupils are equal, round, and reactive to light.  Cardiovascular:     Rate and Rhythm: Normal rate and regular rhythm.     Pulses: Normal pulses.     Heart sounds: Normal heart sounds. No murmur heard.    No friction rub. No gallop.  Pulmonary:     Effort: Pulmonary effort is normal. No respiratory distress.     Breath sounds: Normal breath sounds.  Abdominal:     General: Bowel sounds are normal. There is no distension.     Palpations: Abdomen is soft. There is no hepatomegaly, splenomegaly or mass.     Tenderness:  There is no abdominal tenderness.  Musculoskeletal:        General: Normal range of motion.     Cervical back: Normal range of motion and neck supple.     Right lower leg: No edema.     Left lower leg: No edema.  Lymphadenopathy:     Cervical: No cervical adenopathy.  Skin:    General: Skin is warm and dry.  Neurological:     General: No focal deficit present.     Mental Status: She is alert and oriented to person, place, and time. Mental status is at baseline.  Psychiatric:        Mood and Affect: Mood normal.        Behavior: Behavior normal.        Thought Content: Thought content normal.        Judgment: Judgment normal.    LABS:      Latest Ref Rng & Units 06/30/2022   12:00 AM 06/23/2022   12:00 AM 06/19/2022    4:37 PM  CBC  WBC  7.3     5.3     7.3   Hemoglobin 12.0 - 16.0 9.0     8.7     9.0   Hematocrit 36 - 46 28     26     28.6   Platelets 150 - 400 K/uL 145     161     112      This result is from an external source.      Latest Ref Rng & Units 06/30/2022   12:00 AM 06/23/2022   12:00 AM 06/19/2022    4:38 PM  CMP  Glucose 70 - 99 mg/dL   147   BUN 4 - '21 10     10     6   ' Creatinine 0.5 - 1.1 0.5     0.6     0.62   Sodium 137 - 147 133     134     132   Potassium 3.5 - 5.1 mEq/L 4.0     4.2     3.8   Chloride 99 - 108 99     99     100   CO2 13 - '22 26     27     22   ' Calcium 8.7 - 10.7 9.3     8.8     8.5   Total Protein 6.5 - 8.1 g/dL   8.5  Total Bilirubin 0.3 - 1.2 mg/dL   1.4   Alkaline Phos 25 - 125 142     113     72   AST 13 - 35 42     41     22   ALT 7 - 35 U/L 44     29     19      This result is from an external source.    Lab Results  Component Value Date   ZOX096 7.2 12/10/2021      STUDIES:     HISTORY:   Allergies: No Known Allergies  Current Medications: Current Outpatient Medications  Medication Sig Dispense Refill   acetaminophen (TYLENOL) 500 MG tablet Take 500 mg by mouth every 6 (six) hours as needed for pain or  headache.     Calcium Carbonate (CALTRATE 600 PO) Take 1 tablet by mouth in the morning and at bedtime.     HYDROcodone-acetaminophen (NORCO/VICODIN) 5-325 MG tablet Take 1 tablet by mouth every 4 (four) hours as needed.     metoprolol succinate (TOPROL-XL) 25 MG 24 hr tablet Take 1 tablet (25 mg total) by mouth 2 (two) times daily. 180 tablet 3   Multiple Vitamin (MULTIVITAMIN WITH MINERALS) TABS tablet Take 1 tablet by mouth daily. Centrum Silver     ondansetron (ZOFRAN) 4 MG tablet Take 1 tablet (4 mg total) by mouth every 4 (four) hours as needed for nausea. 90 tablet 3   potassium chloride SA (KLOR-CON M) 20 MEQ tablet Take 40 mEq by mouth 2 (two) times daily. Takes 5 tablets daily while taking Torsemide     Probiotic Product (PROBIOTIC PO) Take 1 capsule by mouth daily.     prochlorperazine (COMPAZINE) 10 MG tablet Take 1 tablet (10 mg total) by mouth every 6 (six) hours as needed for nausea or vomiting. 90 tablet 3   sulfamethoxazole-trimethoprim (BACTRIM DS) 800-160 MG tablet Take 1 tablet by mouth daily. 10 tablet 0   No current facility-administered medications for this visit.     ASSESSMENT & PLAN:   Assessment: 1. History of stage IB high-grade endometrial carcinoma treated with hysterectomy/bilateral salpingo-oophorectomy, followed by adjuvant radiation and brachytherapy in 2018.   2. 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  She had progression of her disease in August 2020. As her tumor was MSI-high, she was placed on palliative pembrolizumab. Unfortunately, she had progressive disease after 6 cycles of pembrolizumab.  She was therefore placed on palliative carboplatin/paclitaxel/bevacizumb. Bevacizumab was discontinued due to persistent vaginal bleeding/hematuria and epistaxis.  She completed 6 cycles of carboplatin/paclitaxel in May 2020. CT imaging of  April 2023 remains stable.   3. Pulmonary emboli in February 2021.  She is at increased risk for thrombosis due to her malignancy, but due to recurrent bleeding, we discontinued rivaroxaban .  She has not had evidence of recurrent thrombosis.  4. Solitary right cerebellar metastasis, status post preoperative stereotactic radio surgery followed by surgical resection.   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  She continues to follow with Dr. Mickeal Skinner of Neuro-Oncology.  MRI in December 2022 revealed stable post-surgical and post-treatment appearance of the right cerebellar hemisphere. No new intracranial metastatic lesion is identified.   5. Severe anemia, with significant drop since June.  6.  Multiple bilateral pulmonary nodules which are stable and measuring up to 5 mm in diameter.   plan:   I  am very concerned about her today. She has severe anemia and I will do an evaluation for vitamin deficiency as well as hemolysis. She also has fever so we will culture blood and urine. She has lost 10 pounds and appears quite ill. I will schedule her for CT scans of chest, abdomen and pelvis. I will see her back next week to review the results.  The patient and her sister understand the plans discussed today and are in agreement with them.  She knows to contact our office if she develops concerns prior to her next appointment.  I provided 30 minutes of face-to-face time during this this encounter and > 50% was spent counseling as documented under my assessment and plan.

## 2022-06-10 ENCOUNTER — Other Ambulatory Visit: Payer: Self-pay | Admitting: Oncology

## 2022-06-10 ENCOUNTER — Encounter: Payer: Self-pay | Admitting: Oncology

## 2022-06-10 ENCOUNTER — Other Ambulatory Visit: Payer: Self-pay

## 2022-06-10 DIAGNOSIS — D4621 Refractory anemia with excess of blasts 1: Secondary | ICD-10-CM | POA: Diagnosis not present

## 2022-06-10 DIAGNOSIS — D649 Anemia, unspecified: Secondary | ICD-10-CM

## 2022-06-10 DIAGNOSIS — C541 Malignant neoplasm of endometrium: Secondary | ICD-10-CM

## 2022-06-10 DIAGNOSIS — R509 Fever, unspecified: Secondary | ICD-10-CM

## 2022-06-10 LAB — URINALYSIS, COMPLETE (UACMP) WITH MICROSCOPIC
Bilirubin Urine: NEGATIVE
Glucose, UA: NEGATIVE mg/dL
Ketones, ur: NEGATIVE mg/dL
Nitrite: NEGATIVE
Protein, ur: 30 mg/dL — AB
Specific Gravity, Urine: 1.01 (ref 1.005–1.030)
WBC, UA: >50 WBC/hpf — ABNORMAL HIGH (ref 0–5)
pH: 6 (ref 5.0–8.0)

## 2022-06-10 LAB — LACTATE DEHYDROGENASE: LDH: 192 U/L (ref 98–192)

## 2022-06-10 LAB — IRON AND TIBC
Iron: 28 ug/dL (ref 28–170)
Saturation Ratios: 12 % (ref 10.4–31.8)
TIBC: 227 ug/dL — ABNORMAL LOW (ref 250–450)
UIBC: 199 ug/dL

## 2022-06-10 LAB — RETICULOCYTES
Immature Retic Fract: 12.4 % (ref 2.3–15.9)
RBC.: 3.11 MIL/uL — ABNORMAL LOW (ref 3.87–5.11)
Retic Count, Absolute: 55.4 10*3/uL (ref 19.0–186.0)
Retic Ct Pct: 1.8 % (ref 0.4–3.1)

## 2022-06-10 LAB — FERRITIN: Ferritin: 762 ng/mL — ABNORMAL HIGH (ref 11–307)

## 2022-06-10 LAB — VITAMIN B12: Vitamin B-12: 1039 pg/mL — ABNORMAL HIGH (ref 180–914)

## 2022-06-10 LAB — FOLATE: Folate: 20.7 ng/mL (ref >5.9)

## 2022-06-11 ENCOUNTER — Other Ambulatory Visit: Payer: Self-pay | Admitting: Oncology

## 2022-06-11 ENCOUNTER — Telehealth: Payer: Self-pay

## 2022-06-11 LAB — HAPTOGLOBIN: Haptoglobin: 558 mg/dL — ABNORMAL HIGH (ref 37–355)

## 2022-06-11 NOTE — Telephone Encounter (Signed)
Pt's sister took her to ER for evaluation of fast heart rate.

## 2022-06-12 ENCOUNTER — Other Ambulatory Visit: Payer: Self-pay | Admitting: Oncology

## 2022-06-12 DIAGNOSIS — D649 Anemia, unspecified: Secondary | ICD-10-CM

## 2022-06-12 LAB — URINE CULTURE: Culture: >=100000 — AB

## 2022-06-14 ENCOUNTER — Encounter: Payer: Self-pay | Admitting: Oncology

## 2022-06-14 ENCOUNTER — Telehealth: Payer: Self-pay

## 2022-06-14 ENCOUNTER — Inpatient Hospital Stay: Payer: Medicare Other

## 2022-06-14 DIAGNOSIS — D4621 Refractory anemia with excess of blasts 1: Secondary | ICD-10-CM | POA: Diagnosis not present

## 2022-06-14 DIAGNOSIS — D649 Anemia, unspecified: Secondary | ICD-10-CM

## 2022-06-14 LAB — PROTEIN ELECTROPHORESIS, SERUM
A/G Ratio: 0.5 — ABNORMAL LOW (ref 0.7–1.7)
Albumin ELP: 2.9 g/dL (ref 2.9–4.4)
Alpha-1-Globulin: 0.5 g/dL — ABNORMAL HIGH (ref 0.0–0.4)
Alpha-2-Globulin: 1.4 g/dL — ABNORMAL HIGH (ref 0.4–1.0)
Beta Globulin: 1.2 g/dL (ref 0.7–1.3)
Gamma Globulin: 2.4 g/dL — ABNORMAL HIGH (ref 0.4–1.8)
Globulin, Total: 5.4 g/dL — ABNORMAL HIGH (ref 2.2–3.9)
Total Protein ELP: 8.3 g/dL (ref 6.0–8.5)

## 2022-06-14 LAB — BASIC METABOLIC PANEL
BUN: 14 (ref 4–21)
CO2: 22 (ref 13–22)
Chloride: 100 (ref 99–108)
Creatinine: 0.7 (ref 0.5–1.1)
Glucose: 172
Potassium: 4 mEq/L (ref 3.5–5.1)
Sodium: 134 — AB (ref 137–147)

## 2022-06-14 LAB — CBC AND DIFFERENTIAL
HCT: 24 — AB (ref 36–46)
Hemoglobin: 7.9 — AB (ref 12.0–16.0)
Neutrophils Absolute: 5.17
Platelets: 176 10*3/uL (ref 150–400)
WBC: 6.8

## 2022-06-14 LAB — HEPATIC FUNCTION PANEL
ALT: 24 U/L (ref 7–35)
AST: 26 (ref 13–35)
Alkaline Phosphatase: 89 (ref 25–125)
Bilirubin, Total: 0.9

## 2022-06-14 LAB — CBC: RBC: 2.81 — AB (ref 3.87–5.11)

## 2022-06-14 LAB — COMPREHENSIVE METABOLIC PANEL
Albumin: 3.7 (ref 3.5–5.0)
Calcium: 9.1 (ref 8.7–10.7)

## 2022-06-14 NOTE — Telephone Encounter (Signed)
Patient presented in clinic with sister (ava) stating that she was seen in the ED this past Friday.  She had a temp this am of 100., pulse 118, sp02 97%, short of breath and light headed.  Per Amy C. Advised patient and sister to take her to ED for evaluation.  Sister transported patient to ED.

## 2022-06-15 ENCOUNTER — Other Ambulatory Visit: Payer: Self-pay | Admitting: Oncology

## 2022-06-15 ENCOUNTER — Other Ambulatory Visit: Payer: Self-pay | Admitting: Hematology and Oncology

## 2022-06-15 DIAGNOSIS — D649 Anemia, unspecified: Secondary | ICD-10-CM

## 2022-06-15 LAB — HAPTOGLOBIN: Haptoglobin: 535 mg/dL — ABNORMAL HIGH (ref 37–355)

## 2022-06-16 ENCOUNTER — Ambulatory Visit: Payer: Medicare Other | Admitting: Dietician

## 2022-06-16 NOTE — Progress Notes (Signed)
Nutrition Assessment:  Called patient at home telephone number and she gave the telephone to her sister Ava.   Reason for Assessment: MST screen for weight loss.    ASSESSMENT: Patient is a 66 year old female with recurrent endometrial carcinoma with lung metastasis and mets to brain.  She has a PMHx that includes recurrent urinary tract infections, chronic anemia, PE, and CAD.  She is under surveillance by Dr. Hinton Rao and is coming for follow up on hemoglobin falling tomorrow.   Sister reports patient has noticed red blood in stools this week.  Sister reports patient has a good appetite but with recent worsening anemia, and fatigue associated with her anemia and diarrhea for past month she has had a decrease in what she is eating.  Patient was also treated in ER for another UTI and is taking ABX to treat.  Usual intake at present is a two meal per day pattern:  Grilled chicken, broiled fish Watermelon, cantaloupe  Boiled eggs  Food she is avoiding now due to worsening diarrhea: Boost ONS, and  Fried foods  Fluids currently: 4-5 bottles of water  Meds include: KCl  Anthropometrics:  Weight loss 17# (7.7%) less than 90 days  Height: 66" Weight:  06/09/22  203.9# 04/20/22  214# 04/01/22  220.9# UBW: 220-225 BMI: 32.9  INTERVENTION:   Educated on importance of adequate calorie and protein energy intake  with nutritient dense foods when possible to maintain weight/strength  Encouraged lean protein foods as well as small frequent meals/snacks. Discussed strategies for managing diarrhea Emailed Nutrition Tip for Diarrhea, Anemia & Open house flier to sister at [email or web address]  Contact information provided   MONITORING, EVALUATION, GOAL: weight, PO intake, Nutrition Impact Symptoms, labs  Goal is weight maintenance, fluid intake greater than 100 oz with 1 cup electrolyte replacement drink for each episode of diarrhea.  Next Visit: Telephone follow up next week  April Manson, RDN, LDN Registered Dietitian, [place] Part Time Remote (Usual office hours: Tuesday-Thursday) Cell: [phone]

## 2022-06-16 NOTE — Progress Notes (Deleted)
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 female with recurrent endometrial carcinoma with lung metastasis.  She was originally diagnosed with stage IB (T1b N0 M0) high-grade endometrial carcinoma in late 2017. She had a robotic hysterectomy and bilateral salpingo-oophorectomy in January 2018.  Pathology revealed a 4 cm, grade 3, endometrioid adenocarcinoma with over 50% invasion of the myometrium and lymphovascular invasion.  Six lymph nodes were negative for metastasis.  Margins were clear.  She received adjuvant external beam radiation, completed in April 2018, followed by vaginal brachytherapy to the vaginal cuff.   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  MSI was high.  A referral to [facility] was ordered in 2018, but the patient never followed through.   CT chest, abdomen and pelvis prior to surgery revealed subtle low-attenuation in the central uterus, as well as small bibasilar pulmonary nodules, which were suspicious for metastatic disease, but only up to 6 mm in size.  PET scan in February 2018 did not reveal hypermetabolic activity in the lung nodules or in the left inguinal node or portacaval node.  There was mild uptake of the pelvic sidewall, which was felt to represent postoperative changes.   The patient was seen in routine follow-up in March 2019.  Repeat CT chest revealed new pulmonary nodules measuring up to 1.8 cm in the right middle lobe and not in the area of the prior tiny nodules, with a 1.5 cm left lower lobe cavitary nodule.  The previously seen changes of the pelvic sidewall on the prior PET scan had  resolved.  PET in May revealed hypermetabolic activity within the pulmonary nodules consistent with metastatic disease.  There was mild hypermetabolic activity within the portacaval node, which was still felt to be reactive.  We recommended 6 cycles of carboplatin and paclitaxel, but when she presented for chemotherapy education on May 29, her sister requested a referral to a pulmonologist for a definitive tissue diagnosis.  Dr. Melvyn Novas at [facility] pulmonology went over all the risks and benefits of having a bronchoscopy.  The patient decided it was not worth the risk to undergo biopsy and decided to proceed with chemotherapy.  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  We did obtain MLH1 methylation analysis and there was hypermethylation of MLH1, so this does not likely represent Lynch syndrome.    She received palliative carboplatin/paclitaxel from June to September 2019, completing 6 cycles.  Repeat CT imaging after 3 cycles of carboplatin/paclitaxel revealed a good response with a decrease in the size and number of pulmonary nodules.  She had chronic hematuria and saw Dr. Nila Nephew, and cystoscopy revealed findings of radiation cystitis.  She has also had recurrent urinary tract infections, so was placed on D-mannose for prevention.  CT chest, abdomen and pelvis in October 2019 revealed stable to slightly decreased metastatic lung lesions with no other sites of metastatic disease.  Chemotherapy was discontinued and she was placed on tamoxifen 20 mg daily for maintenance.  Repeat CT scans in January 2020 revealed scattered small bilateral  pulmonary nodules that were overall grossly unchanged, including a 10 mm irregular nodule of the right upper lobe and two 6 mm nodules of the right lower lobe.  CT imaging in April was fairly stable with just a slight increase in the right middle lobe pulmonary nodule,  which was difficult to assess due to motion artifact.  Unfortunately, CT imaging in August revealed considerable enlargement of the right middle lobe pulmonary nodule from 1 cm to 2.3 cm consistent with progressive pulmonary metastasis.  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  [facility] CDx testing reveals MSI high with an increased TMB (tumor mutational burden) making the patient eligible for immunotherapy. Tamoxifen was discontinued.  She was placed on palliative pembrolizumab every 3 weeks in October 2020.  She presented to the emergency department with shortness of breath in November 2020.  CT angiogram chest revealed small filling defects in the right lower lobe and probably right upper lobe pulmonary arteries consistent with small pulmonary emboli. The metastatic lesion within the right middle lobe was stable at 3 cm.  There was scattered subsegmental atelectasis.  The patient was hospitalized at that time, as she presented with hypoxia.  She initially was given enoxaparin and then transitioned to rivaroxaban.    After completion of 6 cycles of pembrolizumab, she had progressive disease within the chest.  CT chest, abdomen, and pelvis in February 2021 revealed a dramatic increase in the size of the dominant right middle lobe pulmonary nodule, now measuring 5.1 x 5.1 cm, previously measuring 2.4 x 2.3 cm.  The smaller pulmonary nodules in the right lower lobe were stable, but one new pulmonary nodule was seen within the central right middle lobe measuring 14 mm.  There was no evidence of local recurrence of endometrial carcinoma in the pelvis or evidence of metastatic disease in the abdomen or pelvis.  The bladder, kidneys and ureters appeared normal.  In light of these results, her treatment was changed to carboplatin/paclitaxel/bevacizumab  beginning in February. She presented to the emergency department with vaginal bleeding in March.  This was felt to be hematuria due to urinary tract infection, and she was found to have Klebsiella pneumoniae in the urine and was discharged on [place].  She had anemia felt to be secondary to blood loss and her hemoglobin dropped to 8.8, but was back up to 9.5 at the time of discharge. CT chest angiogram did not reveal any evidence of pulmonary embolism.  There was an interval decrease in the pulmonary nodules.  The rivaroxaban was resumed on discharge.  Bevacizumab was permanently discontinued due to continued bleeding.  She saw Dr. Denman George for a vaginal exam in some time.  No vaginal mass or source of bleeding was identified on vaginal examination.  She had recurrent bleeding, so Dr. Venetia Maxon had held her rivaroxaban.  She was found to have another urinary tract infection, this time with Staphylococcal hemolyticus, which was treated with nitrofurantoin.     CT chest, abdomen and pelvis  in June of 2021 revealed an excellent response to treatment.  The large right middle lobe mass had resolved, and the right lower lobe pulmonary nodules remain stable at 5 mm.  There was no evidence of metastatic disease in the abdomen or pelvis.  She had had right calf tenderness, redness and warmth and was seen by by Dr. Venetia Maxon.  Ultrasound was negative for  DVT.  He felt this represented cellulitis, so treated her with cephalexin 500 mg. CT chest in September 2021 revealed stable small pulmonary nodules without evidence of metastatic disease within the abdomen and pelvis.  She was doing well at the time, so was scheduled for 3 month follow up with repeat imaging.  The patient presented in November 2021 with headache, nausea and vomiting, as well as dizziness and weight loss.  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  Pathology confirmed metastatic endometrial adenocarcinoma.  She did well postop and was tapered off dexamethasone. She continues to follow with Dr. Mickeal Skinner and MRI brain from September 2022 revealed stable post-surgical and post-treatment appearance of the right cerebellar hemisphere. No new intracranial metastatic lesion is identified. CT chest, abdomen and pelvis from October 28th,2022 revealed no new evidence of metastatic disease in the chest, abdomen or pelvis, with stable 5 mm right lower lobe pulmonary nodules.  There is a right thyroid nodule stable with respect imaged portions less than 1.5 cm on previous imaging, and mild hepatic steatosis.  MRI of the brain in December with Dr. Mickeal Skinner remains stable.  Her last CA  125 in February remains normal.  INTERVAL HISTORY:  Brianna Herring is here for routine follow up and she feels fairly poorly.  She went to the emergency room 06/16/2022. She reports of coughing up phlegm and does complain of sore throat. She has had severe anemia and is requiring transfusions. Stool hemoccults were negative in the emergency room but she does report blood in the stool . We are trying to get her in with a Gastroenterologist but there is a delay in getting an appointment.She reports of having a fever and her sister reports its been on and off and occasionally spikes for 2-3 weeks. We know she had a UTI with klebsiella and is on cefdinir for this but continues to have fevers. I am concerned that she has acute bronchitis as well and we need to broaden her antibiotic coverage. I will add Zithromax 500 mg daily for one week. She is set to get two units of packed red blood cells on 06/18/2022. Her  appetite is good, but she has lost 12 pounds since June.   Wt Readings from Last 3 Encounters:  07/07/22 188 lb (85.3 kg)  07/02/22 193 lb (87.5 kg)  06/30/22 193 lb 9.6 oz (87.8 kg)  Her hemoglobin is at 7.7, otherwise blood counts and chemistries are normal. We will start her on Zithromax 500  mg. She now has a severe left shift in her white blood cells with presence of promyelocytes and possible blasts. I am concerned about the possibility of chemotherapy induced myelodysplasia or leukemia. I have recommended she get a bone marrow test next week 06/23/2022. We will see her one week later to review the results. She denies nausea, vomiting, bowel issues, or abdominal pain.  She denies sore throat, cough, dyspnea, or chest pain.  REVIEW OF SYSTEMS:  Review of Systems  Constitutional: Negative.  Negative for appetite change, chills, fatigue, fever and unexpected weight change.  HENT:  Negative.    Eyes: Negative.   Respiratory: Negative.  Negative for chest tightness, cough, hemoptysis, shortness of breath and wheezing.   Cardiovascular: Negative.  Negative for chest pain, leg swelling and palpitations.  Gastrointestinal: Negative.  Negative for abdominal distention, abdominal pain, blood in stool, constipation, diarrhea, nausea and vomiting.  Endocrine: Negative.  Genitourinary: Negative.  Negative for difficulty urinating, dysuria, frequency and hematuria.   Musculoskeletal: Negative.  Negative for arthralgias, back pain, flank pain, gait problem and myalgias.  Skin: Negative.   Neurological:  Negative for extremity weakness, gait problem, headaches, light-headedness, numbness, seizures and speech difficulty.  Hematological: Negative.   Psychiatric/Behavioral: Negative.  Negative for depression and sleep disturbance. The patient is not nervous/anxious.      VITALS:  Blood pressure 130/67, pulse (!) 122, temperature 99.7 F (37.6 C), temperature source Oral, resp. rate (!) 22, height '5\' 6"'  (1.676 m), weight 202 lb 14.4 oz (92 kg), SpO2 98 %.  Wt Readings from Last 3 Encounters:  07/07/22 188 lb (85.3 kg)  07/02/22 193 lb (87.5 kg)  06/30/22 193 lb 9.6 oz (87.8 kg)    Body mass index is 32.75 kg/m.  Performance status (ECOG): 0 - Asymptomatic  PHYSICAL EXAM:  Physical  Exam Constitutional:      General: She is not in acute distress.    Appearance: Normal appearance. She is normal weight.  HENT:     Head: Normocephalic and atraumatic.  Eyes:     General: No scleral icterus.    Extraocular Movements: Extraocular movements intact.     Conjunctiva/sclera: Conjunctivae normal.     Pupils: Pupils are equal, round, and reactive to light.  Cardiovascular:     Rate and Rhythm: Regular rhythm. Tachycardia present.     Pulses: Normal pulses.     Heart sounds: Normal heart sounds. No murmur heard.    No friction rub. No gallop.  Pulmonary:     Effort: Pulmonary effort is normal. No respiratory distress.     Breath sounds: Normal breath sounds.  Abdominal:     General: Bowel sounds are normal. There is no distension.     Palpations: Abdomen is soft. There is no hepatomegaly, splenomegaly or mass.     Tenderness: There is no abdominal tenderness.  Musculoskeletal:        General: Normal range of motion.     Cervical back: Normal range of motion and neck supple.     Right lower leg: No edema.     Left lower leg: No edema.  Lymphadenopathy:     Cervical: No cervical adenopathy.  Skin:    General: Skin is warm and dry.  Neurological:     General: No focal deficit present.     Mental Status: She is alert and oriented to person, place, and time. Mental status is at baseline.  Psychiatric:        Mood and Affect: Mood normal.        Behavior: Behavior normal.        Thought Content: Thought content normal.        Judgment: Judgment normal.    LABS:      Latest Ref Rng & Units 07/07/2022   12:00 AM 06/30/2022   12:00 AM 06/23/2022   12:00 AM  CBC  WBC  4.9     7.3     5.3      Hemoglobin 12.0 - 16.0 8.2     9.0     8.7      Hematocrit 36 - 46 '25     28     26      ' Platelets 150 - 400 K/uL 113     145     161         This result is from an external source.  Latest Ref Rng & Units 07/07/2022   12:00 AM 06/30/2022   12:00 AM 06/23/2022   12:00 AM   CMP  BUN 4 - '21 9     10     10      ' Creatinine 0.5 - 1.1 0.5     0.5     0.6      Sodium 137 - 147 135     133     134      Potassium 3.5 - 5.1 mEq/L 3.8     4.0     4.2      Chloride 99 - 108 98     99     99      CO2 13 - '22 28     26     27      ' Calcium 8.7 - 10.7 9.3     9.3     8.8      Alkaline Phos 25 - 125 94     142     113      AST 13 - 35 34     42     41      ALT 7 - 35 U/L 30     44     29         This result is from an external source.    Lab Results  Component Value Date   CAN125 7.2 12/10/2021      STUDIES:  EXAM 06/16/22 CT ANGIOGRAPHY CHEST WITH CONTRAST IMPRESSION No pulmonary embolus Interval increase in size of trace to small volume pericardial effusion. Interval development of trace bilateral, right greater than left, pleural effusion. Couple pulmonary micronodules. No follow-up needed if patient is low risk (and has no known or suspected primary neoplasm). Non-contrast chest CT can  be considered in 12 months if patient is high-risk. This recommendation follows the consensus statement: Guidelines for Management of Incidental Pulmonary Nodules Detected on CT images: From the Fleischner Society 2017; Radiology 2017; [id number]  EXAM:06/11/22 CT CHEST, ABDOMEN, AND PELVIS WITH CONTRAST IMPRESSION: Trace pericardial effusion, new since prior study. Otherwise stable examination, without evidence of recurrent or progressive metastatic disease. Stbale sub 5 mm pulmonary nodules. No acute intra-abdominal or intrapelvic process. Aortic Atherosclerosis (ICD10-170.0)    EXAM:03/30/2022 MRI HEAD WITHOUT AND WITH CONTRAST  FINDINGS: Brain:   Mild generalized parenchymal atrophy.   Sequela of prior right suboccipital craniotomy and right cerebellar mass resection. Unchanged mild non-masslike enhancement, and small amount of chronic blood products, at the resection site.   No new enhancing intracranial lesions are identified.   Multifocal T2 FLAIR  hyperintense signal abnormality within the cerebral white matter, nonspecific but compatible with moderate chronic small vessel ischemic disease.   There is no acute infarct.   No extra-axial fluid collection.   No midline shift.   Vascular: Maintained flow voids within the proximal large arterial vessels.   Skull and upper cervical spine: Right suboccipital cranioplasty. No focal suspicious marrow lesion. Incompletely assessed cervical spondylosis.   Sinuses/Orbits: No mass or acute finding within the imaged orbits. Tiny mucous retention cyst within the right maxillary sinus.   Other: Trace fluid within the right mastoid air cells.   IMPRESSION: Stable post-treatment changes within the right cerebellar hemisphere.   No new intracranial metastasis is identified.   Moderate chronic small vessel ischemic disease within the cerebral white matter, stable.   HISTORY:   Allergies: No Known Allergies  Current Medications: Current Outpatient  Medications  Medication Sig Dispense Refill   acetaminophen (TYLENOL) 500 MG tablet Take 500 mg by mouth every 6 (six) hours as needed for pain or headache.     Calcium Carbonate (CALTRATE 600 PO) Take 1 tablet by mouth in the morning and at bedtime.     HYDROcodone-acetaminophen (NORCO/VICODIN) 5-325 MG tablet Take 1 tablet by mouth every 4 (four) hours as needed.     metoprolol succinate (TOPROL-XL) 25 MG 24 hr tablet Take 1 tablet (25 mg total) by mouth 2 (two) times daily. 180 tablet 3   Multiple Vitamin (MULTIVITAMIN WITH MINERALS) TABS tablet Take 1 tablet by mouth daily. Centrum Silver     ondansetron (ZOFRAN) 4 MG tablet Take 1 tablet (4 mg total) by mouth every 4 (four) hours as needed for nausea. 90 tablet 3   potassium chloride SA (KLOR-CON M) 20 MEQ tablet Take 40 mEq by mouth 2 (two) times daily. Takes 5 tablets daily while taking Torsemide     Probiotic Product (PROBIOTIC PO) Take 1 capsule by mouth daily.     prochlorperazine  (COMPAZINE) 10 MG tablet Take 1 tablet (10 mg total) by mouth every 6 (six) hours as needed for nausea or vomiting. 90 tablet 3   No current facility-administered medications for this visit.     ASSESSMENT & PLAN:   Assessment: 1. History of stage IB high-grade endometrial carcinoma treated with hysterectomy/bilateral salpingo-oophorectomy, followed by adjuvant radiation and brachytherapy in 2018.   2. 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  She had progression of her disease in August 2020. As her tumor was MSI-high, she was placed on palliative pembrolizumab. Unfortunately, she had progressive disease after 6 cycles of pembrolizumab.  She was therefore placed on palliative carboplatin/paclitaxel/bevacizumb. Bevacizumab was discontinued due to persistent vaginal bleeding/hematuria and epistaxis.  She completed 6 cycles of carboplatin/paclitaxel in May 2020. CT imaging of April 2023 remains stable.   3. Pulmonary emboli in February 2021.  She is at increased risk for thrombosis due to her malignancy, but due to recurrent bleeding, we discontinued rivaroxaban .  She has not had evidence of recurrent thrombosis.  4. Solitary right cerebellar metastasis, status post preoperative stereotactic radio surgery followed by surgical resection.   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  She continues to follow with Dr. Mickeal Skinner of Neuro-Oncology.  MRI in December 2022 revealed stable post-surgical and post-treatment appearance of the right cerebellar hemisphere. No new intracranial metastatic lesion is identified.   5. Severe anemia, much worse than previous. She has no evidence of hemolytic anemia, nutritional deficiency, or multiple myeloma. I therefore think that she is losing blood in the GI tract. However the other possibility would be bone marrow  dysfunction, but her white and red cells remain normal. We will schedule a bone marrow for next week. Since her iron test equivocal, I will check a transferrin receptor today.  6.  Multiple bilateral pulmonary nodules which are stable and measuring up to 5 mm in diameter.   Plan:   I will schedule a bone marrow next week and recheck her labs at that time. I will bring her back in 2 weeks to review the results. We will continue to try to get her in with a  GI specialist ASAP. She will be transfused with 2 units of red packed blood cells tomorrow.  Since her iron test equivocal, I will check a transferrin receptor  today. Then we can decide if she needs IV iron as well.The patient and her sister understand the plans discussed today and are in agreement with them.  She knows to contact our office if she develops concerns prior to her next appointment.  I provided 30 minutes of face-to-face time during this this encounter and > 50% was spent counseling as documented under my assessment and plan.      I,Gabriella Ballesteros,acting as a scribe for Derwood Kaplan, MD.,have documented all relevant documentation on the behalf of Derwood Kaplan, MD,as directed by  Derwood Kaplan, MD while in the presence of Derwood Kaplan, MD.

## 2022-06-17 ENCOUNTER — Inpatient Hospital Stay (INDEPENDENT_AMBULATORY_CARE_PROVIDER_SITE_OTHER): Payer: Medicare Other | Admitting: Oncology

## 2022-06-17 ENCOUNTER — Inpatient Hospital Stay: Payer: Medicare Other

## 2022-06-17 ENCOUNTER — Other Ambulatory Visit: Payer: Self-pay | Admitting: Hematology and Oncology

## 2022-06-17 ENCOUNTER — Other Ambulatory Visit: Payer: Self-pay | Admitting: Oncology

## 2022-06-17 ENCOUNTER — Encounter: Payer: Self-pay | Admitting: Oncology

## 2022-06-17 VITALS — BP 130/67 | HR 122 | Temp 99.7°F | Resp 22 | Ht 66.0 in | Wt 202.9 lb

## 2022-06-17 DIAGNOSIS — C7801 Secondary malignant neoplasm of right lung: Secondary | ICD-10-CM

## 2022-06-17 DIAGNOSIS — C7802 Secondary malignant neoplasm of left lung: Secondary | ICD-10-CM | POA: Diagnosis not present

## 2022-06-17 DIAGNOSIS — C7931 Secondary malignant neoplasm of brain: Secondary | ICD-10-CM | POA: Diagnosis not present

## 2022-06-17 DIAGNOSIS — D4621 Refractory anemia with excess of blasts 1: Secondary | ICD-10-CM | POA: Diagnosis not present

## 2022-06-17 DIAGNOSIS — D649 Anemia, unspecified: Secondary | ICD-10-CM

## 2022-06-17 DIAGNOSIS — J209 Acute bronchitis, unspecified: Secondary | ICD-10-CM

## 2022-06-17 LAB — COMPREHENSIVE METABOLIC PANEL
Albumin: 3.8 (ref 3.5–5.0)
Calcium: 9.3 (ref 8.7–10.7)

## 2022-06-17 LAB — CHG BLOOD COUNT SMEAR MCRSCP W/MNL DIFRNTL WBC COUNT
Band Neutrophils: 12
Hyposegmented Neutrophils: 2
Lymphocytes Absolute: 1 /uL
Lymphocytes, %: 22
Metamyelocytes Relative: 3
Monocytes: 6
NUCLEATED RBC: 1
Neutrophil %: 55
PROMYELOCYTES: 2
Schistocytes: 1

## 2022-06-17 LAB — SOLUBLE TRANSFERRIN RECEPTOR: Transferrin Receptor: 29.2 nmol/L — ABNORMAL HIGH (ref 12.2–27.3)

## 2022-06-17 LAB — HEPATIC FUNCTION PANEL
ALT: 27 U/L (ref 7–35)
AST: 26 (ref 13–35)
Alkaline Phosphatase: 110 (ref 25–125)
Bilirubin, Total: 1.1

## 2022-06-17 LAB — CBC AND DIFFERENTIAL
HCT: 24 — AB (ref 36–46)
Hemoglobin: 7.7 — AB (ref 12.0–16.0)
Neutrophils Absolute: 0.94
Platelets: 169 10*3/uL (ref 150–400)
WBC: 5.5

## 2022-06-17 LAB — BASIC METABOLIC PANEL
BUN: 9 (ref 4–21)
CO2: 26 — AB (ref 13–22)
Chloride: 99 (ref 99–108)
Creatinine: 0.6 (ref 0.5–1.1)
Glucose: 143
Potassium: 4.2 mEq/L (ref 3.5–5.1)
Sodium: 136 — AB (ref 137–147)

## 2022-06-17 LAB — CBC: RBC: 2.8 — AB (ref 3.87–5.11)

## 2022-06-17 LAB — PREPARE RBC (CROSSMATCH)

## 2022-06-17 MED ORDER — AZITHROMYCIN 500 MG PO TABS: 500.0000 mg | ORAL_TABLET | Freq: Every day | ORAL | 0 refills | Status: AC

## 2022-06-18 ENCOUNTER — Encounter: Payer: Self-pay | Admitting: Oncology

## 2022-06-18 ENCOUNTER — Inpatient Hospital Stay: Payer: Medicare Other

## 2022-06-18 DIAGNOSIS — D649 Anemia, unspecified: Secondary | ICD-10-CM

## 2022-06-18 DIAGNOSIS — D4621 Refractory anemia with excess of blasts 1: Secondary | ICD-10-CM | POA: Diagnosis not present

## 2022-06-18 MED ORDER — HEPARIN SOD (PORK) LOCK FLUSH 100 UNIT/ML IV SOLN
500.0000 [IU] | Freq: Every day | INTRAVENOUS | Status: AC | PRN
  Administered 2022-06-18: 500 [IU]

## 2022-06-18 MED ORDER — SODIUM CHLORIDE 0.9% FLUSH: 3.0000 mL | INTRAVENOUS | Status: DC | PRN

## 2022-06-18 MED ORDER — SODIUM CHLORIDE 0.9% IV SOLUTION
250.0000 mL | Freq: Once | INTRAVENOUS | Status: AC
  Administered 2022-06-18: 250 mL via INTRAVENOUS

## 2022-06-18 MED ORDER — DIPHENHYDRAMINE HCL 25 MG PO CAPS
25.0000 mg | ORAL_CAPSULE | Freq: Once | ORAL | Status: AC
  Administered 2022-06-18: 25 mg via ORAL
  Filled 2022-06-18: qty 1

## 2022-06-18 MED ORDER — SODIUM CHLORIDE 0.9% FLUSH
10.0000 mL | INTRAVENOUS | Status: AC | PRN
  Administered 2022-06-18: 10 mL

## 2022-06-18 MED ORDER — ACETAMINOPHEN 325 MG PO TABS
650.0000 mg | ORAL_TABLET | Freq: Once | ORAL | Status: AC
  Administered 2022-06-18: 650 mg via ORAL
  Filled 2022-06-18: qty 2

## 2022-06-18 NOTE — Patient Instructions (Signed)

## 2022-06-19 ENCOUNTER — Emergency Department (HOSPITAL_COMMUNITY): Payer: Medicare Other

## 2022-06-19 ENCOUNTER — Encounter (HOSPITAL_COMMUNITY): Payer: Self-pay | Admitting: Emergency Medicine

## 2022-06-19 ENCOUNTER — Emergency Department (HOSPITAL_COMMUNITY)
Admission: EM | Admit: 2022-06-19 | Discharge: 2022-06-19 | Disposition: A | Discharge status: Home / Self Care | Payer: Medicare Other | Attending: Emergency Medicine | Admitting: Emergency Medicine

## 2022-06-19 ENCOUNTER — Other Ambulatory Visit: Payer: Self-pay

## 2022-06-19 DIAGNOSIS — R000 Tachycardia, unspecified: Secondary | ICD-10-CM | POA: Insufficient documentation

## 2022-06-19 DIAGNOSIS — K921 Melena: Secondary | ICD-10-CM | POA: Insufficient documentation

## 2022-06-19 DIAGNOSIS — Z20822 Contact with and (suspected) exposure to covid-19: Secondary | ICD-10-CM | POA: Insufficient documentation

## 2022-06-19 DIAGNOSIS — R509 Fever, unspecified: Secondary | ICD-10-CM | POA: Diagnosis present

## 2022-06-19 DIAGNOSIS — Z8542 Personal history of malignant neoplasm of other parts of uterus: Secondary | ICD-10-CM | POA: Diagnosis not present

## 2022-06-19 LAB — BPAM RBC
Blood Product Expiration Date: 202309092359
Blood Product Expiration Date: 202309092359
ISSUE DATE / TIME: 202308110741
ISSUE DATE / TIME: 202308110741
Unit Type and Rh: 5100
Unit Type and Rh: 5100

## 2022-06-19 LAB — CBC WITH DIFFERENTIAL/PLATELET
Abs Immature Granulocytes: 0.45 10*3/uL — ABNORMAL HIGH (ref 0.00–0.07)
Basophils Absolute: 0.1 10*3/uL (ref 0.0–0.1)
Basophils Relative: 1 %
Eosinophils Absolute: 0 10*3/uL (ref 0.0–0.5)
Eosinophils Relative: 0 %
HCT: 28.6 % — ABNORMAL LOW (ref 36.0–46.0)
Hemoglobin: 9 g/dL — ABNORMAL LOW (ref 12.0–15.0)
Immature Granulocytes: 6 %
Lymphocytes Relative: 16 %
Lymphs Abs: 1.2 10*3/uL (ref 0.7–4.0)
MCH: 26.9 pg (ref 26.0–34.0)
MCHC: 31.5 g/dL (ref 30.0–36.0)
MCV: 85.6 fL (ref 80.0–100.0)
Monocytes Absolute: 0.7 10*3/uL (ref 0.1–1.0)
Monocytes Relative: 9 %
Neutro Abs: 4.9 10*3/uL (ref 1.7–7.7)
Neutrophils Relative %: 68 %
Platelets: 112 10*3/uL — ABNORMAL LOW (ref 150–400)
RBC: 3.34 MIL/uL — ABNORMAL LOW (ref 3.87–5.11)
RDW: 20.4 % — ABNORMAL HIGH (ref 11.5–15.5)
WBC: 7.3 10*3/uL (ref 4.0–10.5)
nRBC: 1 % — ABNORMAL HIGH (ref 0.0–0.2)

## 2022-06-19 LAB — RESP PANEL BY RT-PCR (FLU A&B, COVID) ARPGX2
Influenza A by PCR: NEGATIVE
Influenza B by PCR: NEGATIVE
SARS Coronavirus 2 by RT PCR: NEGATIVE

## 2022-06-19 LAB — TYPE AND SCREEN
ABO/RH(D): O POS
ABO/RH(D): O POS
Antibody Screen: NEGATIVE
Antibody Screen: NEGATIVE
Unit division: 0
Unit division: 0

## 2022-06-19 LAB — COMPREHENSIVE METABOLIC PANEL
ALT: 19 U/L (ref 0–44)
AST: 22 U/L (ref 15–41)
Albumin: 2.4 g/dL — ABNORMAL LOW (ref 3.5–5.0)
Alkaline Phosphatase: 72 U/L (ref 38–126)
Anion gap: 10 (ref 5–15)
BUN: 6 mg/dL — ABNORMAL LOW (ref 8–23)
CO2: 22 mmol/L (ref 22–32)
Calcium: 8.5 mg/dL — ABNORMAL LOW (ref 8.9–10.3)
Chloride: 100 mmol/L (ref 98–111)
Creatinine, Ser: 0.62 mg/dL (ref 0.44–1.00)
GFR, Estimated: >60 mL/min (ref >60)
Glucose, Bld: 147 mg/dL — ABNORMAL HIGH (ref 70–99)
Potassium: 3.8 mmol/L (ref 3.5–5.1)
Sodium: 132 mmol/L — ABNORMAL LOW (ref 135–145)
Total Bilirubin: 1.4 mg/dL — ABNORMAL HIGH (ref 0.3–1.2)
Total Protein: 8.5 g/dL — ABNORMAL HIGH (ref 6.5–8.1)

## 2022-06-19 LAB — URINALYSIS, ROUTINE W REFLEX MICROSCOPIC
Bilirubin Urine: NEGATIVE
Glucose, UA: NEGATIVE mg/dL
Hgb urine dipstick: NEGATIVE
Ketones, ur: NEGATIVE mg/dL
Leukocytes,Ua: NEGATIVE
Nitrite: NEGATIVE
Protein, ur: 30 mg/dL — AB
Specific Gravity, Urine: 1.013 (ref 1.005–1.030)
pH: 6 (ref 5.0–8.0)

## 2022-06-19 LAB — POC OCCULT BLOOD, ED: Fecal Occult Bld: NEGATIVE

## 2022-06-19 LAB — LACTIC ACID, PLASMA
Lactic Acid, Venous: 1.1 mmol/L (ref 0.5–1.9)
Lactic Acid, Venous: 1 mmol/L (ref 0.5–1.9)

## 2022-06-19 LAB — PROTIME-INR
INR: 1.6 — ABNORMAL HIGH (ref 0.8–1.2)
Prothrombin Time: 18.4 seconds — ABNORMAL HIGH (ref 11.4–15.2)

## 2022-06-19 MED ORDER — SODIUM CHLORIDE 0.9 % IV SOLN: INTRAVENOUS | Status: DC

## 2022-06-19 MED ORDER — SODIUM CHLORIDE 0.9 % IV BOLUS
1000.0000 mL | Freq: Once | INTRAVENOUS | Status: AC
  Administered 2022-06-19: 1000 mL via INTRAVENOUS

## 2022-06-19 NOTE — ED Provider Triage Note (Signed)
Emergency Medicine Provider Triage Evaluation Note  Brianna Herring , a 66 y.o. female  was evaluated in triage.  Pt complains of ongoing fevers, rectal bleeding, and nausea for the last 1 to 2 weeks.  Recently started a Z-Pak for UTI.  Also received 2 units.  Received blood transfusion from a clinic yesterday due to her hemoglobin reaching 7.7.  Not on anticoagulation due to history of severe vaginal bleeding requiring transfusion several years ago.  Hx of hysterectomy due to endometrial adenocarcinoma.  Denies vomiting or shortness of breath or diarrhea.  Review of Systems  Positive:  Negative: See above  Physical Exam  BP 125/62 (BP Location: Right Arm)   Pulse (!) 121   Temp (!) 100.8 F (38.2 C) (Oral)   Resp (!) 22   LMP  (LMP Unknown)   SpO2 95%  Gen:   Awake, no distress, presenting in wheelchair Resp:  Normal effort  MSK:   Moves extremities without difficulty  Other:  Abdomen soft, nontender  Medical Decision Making  Medically screening exam initiated at 4:13 PM.  Appropriate orders placed.  [name] was informed that the remainder of the evaluation will be completed by another provider, this initial triage assessment does not replace that evaluation, and the importance of remaining in the ED until their evaluation is complete.  Patient meets sepsis criteria, sepsis code initiated.   Prince Rome, PA-C 36/64/40 [id number]

## 2022-06-19 NOTE — ED Provider Notes (Signed)
[facility] EMERGENCY DEPARTMENT Provider Note   CSN: [id number] Arrival date & time: 06/19/22  1553     History  Chief Complaint  Patient presents with   Blood In Stools   Fever    Brianna Herring is a 66 y.o. female.  Patient brought in by family.  Patient is from the [place] area.  Patient has been having fevers for 2 weeks.  Currently being treated for a UTI and then a bronchitis.  Patient on [place] for the UTI that was started August 5 and on Zithromax for the bronchitis that was started August 10.  Has not changed any of the fevers.  Patient arrived here with a temp of 100.8.  Her oncologist has been trying to work-up the fever and they think it possibly could be tumor related.  Patient has not gotten any worse with the fevers and has not gotten any better.  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  The other concern was that she has had red blood with wiping and some in her stools for a week now she been feeling a little bit nauseated but no emesis no abdominal pain she was seen at [facility] twice and has had CT scans of the abdomen that were negative including just recently.  Patient denies any pain here.  Past medical history significant for for the endometrial carcinoma as stated above.  And patient is a non-smoker.  Overall patient's had fevers now for 2 weeks and they are not sure why.  Patient has not gotten significantly worse.  Not improved with the courses of antibiotics.       Home Medications Prior to Admission medications   Medication Sig Start Date End Date Taking? Authorizing Provider  acetaminophen (TYLENOL) 500 MG tablet Take 500 mg by mouth every 6 (six) hours as needed for pain or headache.   Yes [provider]  azithromycin (ZITHROMAX) 500 MG tablet Take 1 tablet (500 mg total) by mouth daily for 7 days.  06/17/22 06/24/22 Yes Derwood Kaplan, MD  Calcium Carbonate (CALTRATE 600 PO) Take 1 tablet by mouth in the morning and at bedtime.   Yes [provider]  cefdinir (OMNICEF) 300 MG capsule Take 300 mg by mouth 2 (two) times daily. 06/12/22  Yes [provider]  metoprolol succinate (TOPROL-XL) 25 MG 24 hr tablet Take 25 mg by mouth daily. 04/14/22  Yes [provider]  Multiple Vitamin (MULTIVITAMIN WITH MINERALS) TABS tablet Take 1 tablet by mouth daily. Centrum Silver   Yes [provider]  potassium chloride SA (KLOR-CON M) 20 MEQ tablet Take 40 mEq by mouth 2 (two) times daily. Takes 5 tablets daily while taking Torsemide 02/27/22  Yes Derwood Kaplan, MD  Probiotic Product (PROBIOTIC PO) Take 1 capsule by mouth daily.   Yes [provider]  torsemide (DEMADEX) 10 MG tablet Take 10 mg by mouth daily as needed (fluid). 06/04/22  Yes [provider]  meclizine (ANTIVERT) 25 MG tablet Take 1 tablet (25 mg total) by mouth 3 (three) times daily as needed for dizziness. Patient not taking: Reported on 06/19/2022 12/10/21   Derwood Kaplan, MD  metoprolol tartrate (LOPRESSOR) 100 MG tablet Take 1 tablet by mouth TWO hours prior to CT scan Patient not taking: Reported on 06/19/2022 05/21/22   Minus Breeding, MD  Allergies    Patient has no known allergies.    Review of Systems   Review of Systems  Constitutional:  Positive for fever. Negative for chills.  HENT:  Negative for ear pain and sore throat.   Eyes:  Negative for pain and visual disturbance.  Respiratory:  Negative for cough and shortness of breath.   Cardiovascular:  Negative for chest pain and palpitations.  Gastrointestinal:  Positive for blood in stool. Negative for abdominal pain and vomiting.  Genitourinary:  Negative for dysuria and hematuria.  Musculoskeletal:  Negative for arthralgias and back pain.  Skin:  Negative for color change and rash.  Neurological:   Negative for seizures and syncope.  All other systems reviewed and are negative.   Physical Exam Updated Vital Signs BP (!) 141/67   Pulse (!) 117   Temp 98.4 F (36.9 C) (Oral)   Resp (!) 40   LMP  (LMP Unknown)   SpO2 96%  Physical Exam Vitals and nursing note reviewed.  Constitutional:      General: She is not in acute distress.    Appearance: Normal appearance. She is well-developed. She is not ill-appearing, toxic-appearing or diaphoretic.  HENT:     Head: Normocephalic and atraumatic.     Mouth/Throat:     Mouth: Mucous membranes are moist.  Eyes:     Extraocular Movements: Extraocular movements intact.     Conjunctiva/sclera: Conjunctivae normal.     Pupils: Pupils are equal, round, and reactive to light.  Cardiovascular:     Rate and Rhythm: Normal rate and regular rhythm.     Heart sounds: No murmur heard. Pulmonary:     Effort: Pulmonary effort is normal. No respiratory distress.     Breath sounds: Normal breath sounds.  Abdominal:     General: There is no distension.     Palpations: Abdomen is soft.     Tenderness: There is no abdominal tenderness. There is no guarding.  Genitourinary:    Rectum: Normal. Guaiac result negative.  Musculoskeletal:        General: No swelling.     Cervical back: Neck supple.  Skin:    General: Skin is warm and dry.     Capillary Refill: Capillary refill takes less than 2 seconds.  Neurological:     General: No focal deficit present.     Mental Status: She is alert and oriented to person, place, and time.     Cranial Nerves: No cranial nerve deficit.     Sensory: No sensory deficit.     Motor: No weakness.  Psychiatric:        Mood and Affect: Mood normal.     ED Results / Procedures / Treatments   Labs (all labs ordered are listed, but only abnormal results are displayed) Labs Reviewed  COMPREHENSIVE METABOLIC PANEL - Abnormal; Notable for the following components:      Result Value   Sodium 132 (*)    Glucose,  Bld 147 (*)    BUN 6 (*)    Calcium 8.5 (*)    Total Protein 8.5 (*)    Albumin 2.4 (*)    Total Bilirubin 1.4 (*)    All other components within normal limits  CBC WITH DIFFERENTIAL/PLATELET - Abnormal; Notable for the following components:   RBC 3.34 (*)    Hemoglobin 9.0 (*)    HCT 28.6 (*)    RDW 20.4 (*)    Platelets 112 (*)    nRBC 1.0 (*)  Abs Immature Granulocytes 0.45 (*)    All other components within normal limits  PROTIME-INR - Abnormal; Notable for the following components:   Prothrombin Time 18.4 (*)    INR 1.6 (*)    All other components within normal limits  URINALYSIS, ROUTINE W REFLEX MICROSCOPIC - Abnormal; Notable for the following components:   Protein, ur 30 (*)    Bacteria, UA RARE (*)    All other components within normal limits  RESP PANEL BY RT-PCR (FLU A&B, COVID) ARPGX2  CULTURE, BLOOD (ROUTINE X 2)  CULTURE, BLOOD (ROUTINE X 2)  LACTIC ACID, PLASMA  LACTIC ACID, PLASMA  POC OCCULT BLOOD, ED  POC OCCULT BLOOD, ED  TYPE AND SCREEN    EKG None  Radiology DG Chest 2 View  Result Date: 06/19/2022 CLINICAL DATA:  Suspected sepsis EXAM: CHEST - 2 VIEW COMPARISON:  Chest x-ray 06/16/2022 FINDINGS: Right chest port catheter tip projects over the SVC, unchanged. The heart size and mediastinal contours are within normal limits. Both lungs are clear. The visualized skeletal structures are unremarkable. IMPRESSION: No active cardiopulmonary disease. Electronically Signed   By: Ronney Asters M.D.   On: 06/19/2022 16:59    Procedures Procedures    Medications Ordered in ED Medications  0.9 %  sodium chloride infusion (0 mLs Intravenous Stopped 06/19/22 2329)  sodium chloride 0.9 % bolus 1,000 mL (0 mLs Intravenous Stopped 06/19/22 2243)    ED Course/ Medical Decision Making/ A&P                           Medical Decision Making Amount and/or Complexity of Data Reviewed Labs: ordered. Radiology: ordered.  Risk Prescription drug  management.   Patient with persistent tachycardia here in the 1 10-1 20 range.  Blood pressure remained very stable.  Hemoccult negative.  And also no gross blood obviously patient's COVID flu was negative.  Lactic acid normal at 1 urinalysis negative no signs of urinary tract infection complete metabolic panel sodium down a little bit at 132 blood sugar 147 renal function greater than 60 on the GFR.  Total bili 1.4 CBC normal hemoglobin 9 platelets 112 INR 1.6 patient was typed and screened she is O+ blood type a.  And patient had lactic acids x2 the first 1 was 1.1 on and the repeat was 1.  So no significant change.  Chest x-ray no acute cardiopulmonary disease.  Overall other than the persistent tachycardia which is a sinus rhythm.  No acute findings.  Plus patient wants to go home.  Had considered observation for perhaps the rectal bleeding but rectal exam Hemoccult negative.  She will continue her follow-up with her oncologist that is working up the fever profile.  No signs of any infectious process.  Not worried about sepsis. Final Clinical Impression(s) / ED Diagnoses Final diagnoses:  Fever, unspecified fever cause  Blood in stool    Rx / DC Orders ED Discharge Orders     None         Fredia Sorrow, MD 06/19/22 2351

## 2022-06-19 NOTE — Discharge Instructions (Addendum)
Follow-up with your oncologist.  Keep appointment with gastroenterology.  Hemoccult negative here but if you get large amounts of blood per rectum in the toilet water and it happens twice a day then you definitely need to get seen again.  Because of the fever is still unknown.  Follow-up with your oncologist further plans.  Return for any new or worse symptoms.  Temperature here is not currently elevated.

## 2022-06-19 NOTE — ED Notes (Signed)
Pt unable to lay flat for orthostatic vitals.

## 2022-06-19 NOTE — ED Triage Notes (Signed)
Patient from home with a fever spiking up and down for 2 weeks currently being treated for a UTI and started on a zpack last Thursday. Patient also having bright red blood in stools for over a week now. Pt referred her by PCP. Pt feeling nauseated, with no emesis. Patient denies pain anywhere.

## 2022-06-21 LAB — PROTEIN ELECTROPHORESIS, SERUM
A/G Ratio: 0.5 — ABNORMAL LOW (ref 0.7–1.7)
Albumin ELP: 2.5 g/dL — ABNORMAL LOW (ref 2.9–4.4)
Alpha-1-Globulin: 0.5 g/dL — ABNORMAL HIGH (ref 0.0–0.4)
Alpha-2-Globulin: 1.3 g/dL — ABNORMAL HIGH (ref 0.4–1.0)
Beta Globulin: 1 g/dL (ref 0.7–1.3)
Gamma Globulin: 2.4 g/dL — ABNORMAL HIGH (ref 0.4–1.8)
Globulin, Total: 5.3 g/dL — ABNORMAL HIGH (ref 2.2–3.9)
Total Protein ELP: 7.8 g/dL (ref 6.0–8.5)

## 2022-06-22 ENCOUNTER — Telehealth: Payer: Self-pay

## 2022-06-22 ENCOUNTER — Other Ambulatory Visit: Payer: Self-pay | Admitting: Oncology

## 2022-06-22 ENCOUNTER — Other Ambulatory Visit: Payer: Self-pay | Admitting: Hematology and Oncology

## 2022-06-22 DIAGNOSIS — N3000 Acute cystitis without hematuria: Secondary | ICD-10-CM

## 2022-06-22 MED ORDER — PROCHLORPERAZINE MALEATE 10 MG PO TABS: 10.0000 mg | ORAL_TABLET | Freq: Four times a day (QID) | ORAL | 3 refills | Status: DC | PRN

## 2022-06-22 MED ORDER — ONDANSETRON HCL 4 MG PO TABS: 4.0000 mg | ORAL_TABLET | ORAL | 3 refills | Status: AC | PRN

## 2022-06-22 NOTE — Telephone Encounter (Addendum)
RE: Persistant nausea, weakness, unsteady gait, mentioned needed to get eyes checked Received: Today Brianna Ped, NP  Brianna Ponder, RN; Brianna Kaplan, MD; Brianna Pickles, PA-C Phone Number: [phone]   I sent in prescriptions for zofran and compazine. I agree with MRI. I can order that as well.   ----- Message from Brianna Kaplan, MD sent at 06/22/2022  2:49 PM EDT ----- Regarding: RE: Persistant nausea, weakness, unsteady gait, mentioned needed to get eyes checked Contact: [phone] I agree, I am very concerned. I don't know why she is still having fevers, we treated the UTI. Will repeat urine when she comes in. And why she is anemic. I agree that she needs brain MRI now. Dr. Mickeal Herring will prefer it be done in [place] with their special technique (in case she needed further XRT), but if she can't get up there, let's at least get one here. Let me know which I should order.   ----- Message ----- From: Brianna Ponder, RN Sent: 06/22/2022   2:24 PM EDT To: Brianna Kaplan, MD; Brianna Ped, NP; # Subject: Persistant nausea, weakness, unsteady gait, #  Pt sister, Brianna Herring, walked into clinic to speak with someone about Brianna Herring. She and Brianna Herring are both very concerned with her physical changes. She tells Brianna Herring she doesn't know why she feels so bad all the time.  She is still running fevers (up to 102 on Sunday and Monday),& having constant nausea this week. If she eats, she gets diarrhea. Brianna Herring states Brianna Herring told her she didn't have anything for nausea. I told her we could send something in for that. They use [facility]. Brianna Herring has been staying with Brianna Herring for the last 2 weeks because she isn't strong enough to stay alone. Brianna Herring is requiring use of walker, because her gait is unsteady. She tells her that her feet hurt bad all the time. They have BSC she uses @ night. Brianna Herring also mentioned to Brianna Herring that she can tell its time for her to get her eyes checked. Her last MRI brain was in  June- Dr Brianna Herring.  Brianna Herring states that Brianna Herring will ask her things that are not like her... What you want me to eat today? Or when on the phone with her mother..."what do you want me to say"?  I wonder if she needs repeat brain MRI w/ those symptoms. Makes me nervous.  Brianna Herring was able to get a GI appt w/ Brianna Herring in approx. a month.  She is scheduled for labs and bone marrow biopsy w/Dr Brianna Herring. Brianna Herring just wanted to come talk today, because there is so many things going on.

## 2022-06-23 ENCOUNTER — Other Ambulatory Visit: Payer: Self-pay | Admitting: Oncology

## 2022-06-23 ENCOUNTER — Inpatient Hospital Stay: Payer: Medicare Other

## 2022-06-23 ENCOUNTER — Other Ambulatory Visit: Payer: Self-pay

## 2022-06-23 ENCOUNTER — Encounter: Payer: Self-pay | Admitting: Oncology

## 2022-06-23 ENCOUNTER — Inpatient Hospital Stay (INDEPENDENT_AMBULATORY_CARE_PROVIDER_SITE_OTHER): Payer: Medicare Other | Admitting: Oncology

## 2022-06-23 VITALS — BP 112/56 | HR 116 | Temp 98.2°F | Resp 18 | Ht 66.0 in | Wt 198.2 lb

## 2022-06-23 DIAGNOSIS — D649 Anemia, unspecified: Secondary | ICD-10-CM

## 2022-06-23 DIAGNOSIS — C541 Malignant neoplasm of endometrium: Secondary | ICD-10-CM | POA: Diagnosis not present

## 2022-06-23 DIAGNOSIS — C7931 Secondary malignant neoplasm of brain: Secondary | ICD-10-CM | POA: Diagnosis not present

## 2022-06-23 DIAGNOSIS — D4621 Refractory anemia with excess of blasts 1: Secondary | ICD-10-CM | POA: Diagnosis not present

## 2022-06-23 DIAGNOSIS — N3000 Acute cystitis without hematuria: Secondary | ICD-10-CM

## 2022-06-23 DIAGNOSIS — L03115 Cellulitis of right lower limb: Secondary | ICD-10-CM

## 2022-06-23 LAB — CBC AND DIFFERENTIAL
HCT: 26 — AB (ref 36–46)
Hemoglobin: 8.7 — AB (ref 12.0–16.0)
Neutrophils Absolute: 4.24
Platelets: 161 10*3/uL (ref 150–400)
WBC: 5.3

## 2022-06-23 LAB — URINALYSIS, COMPLETE (UACMP) WITH MICROSCOPIC
Bilirubin Urine: NEGATIVE
Glucose, UA: NEGATIVE mg/dL
Ketones, ur: 5 mg/dL — AB
Leukocytes,Ua: NEGATIVE
Nitrite: NEGATIVE
Protein, ur: 30 mg/dL — AB
Specific Gravity, Urine: 1.018 (ref 1.005–1.030)
pH: 5 (ref 5.0–8.0)

## 2022-06-23 LAB — HEPATIC FUNCTION PANEL
ALT: 29 U/L (ref 7–35)
AST: 41 — AB (ref 13–35)
Alkaline Phosphatase: 113 (ref 25–125)
Bilirubin, Total: 1.1

## 2022-06-23 LAB — COMPREHENSIVE METABOLIC PANEL
Albumin: 3.4 — AB (ref 3.5–5.0)
Calcium: 8.8 (ref 8.7–10.7)

## 2022-06-23 LAB — BASIC METABOLIC PANEL
BUN: 10 (ref 4–21)
CO2: 27 — AB (ref 13–22)
Chloride: 99 (ref 99–108)
Creatinine: 0.6 (ref 0.5–1.1)
Glucose: 209
Potassium: 4.2 mEq/L (ref 3.5–5.1)
Sodium: 134 — AB (ref 137–147)

## 2022-06-23 LAB — CBC: RBC: 3.12 — AB (ref 3.87–5.11)

## 2022-06-23 LAB — URIC ACID: Uric Acid, Serum: 5.1 mg/dL (ref 2.5–7.1)

## 2022-06-23 MED ORDER — HYDROCODONE-ACETAMINOPHEN 5-325 MG PO TABS: 1.0000 | ORAL_TABLET | Freq: Four times a day (QID) | ORAL | 0 refills | Status: DC | PRN

## 2022-06-23 MED ORDER — SULFAMETHOXAZOLE-TRIMETHOPRIM 800-160 MG PO TABS: 1.0000 | ORAL_TABLET | Freq: Every day | ORAL | 0 refills | Status: DC

## 2022-06-23 NOTE — Progress Notes (Addendum)
ADDENDUM: 06/23/22  PROCEDURE NOTE  BONE MARROW  A time out was held to confirm this is the correct patient and the correct procedure. She was then placed on her left side and the right posterior iliac crest was prepped and draped in sterile fashion. The area was infiltrated with 1% xylocaine and as aspirate was obtained without difficulty. I then used a Jamshidi needle to obtain a bone marrow biopsy. The patient tolerated the procedure well.     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 female with recurrent endometrial carcinoma with lung metastasis.  She was originally diagnosed with stage IB (T1b N0 M0) high-grade endometrial carcinoma in late 2017. She had a robotic hysterectomy and bilateral salpingo-oophorectomy in January 2018.  Pathology revealed a 4 cm, grade 3, endometrioid adenocarcinoma with over 50% invasion of the myometrium and lymphovascular invasion.  Six lymph nodes were negative for metastasis.  Margins were clear.  She received adjuvant external beam radiation, completed in April 2018, followed by vaginal brachytherapy to the vaginal cuff.   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  MSI was high.  A referral to [facility] was ordered in 2018, but the patient never followed through.   CT chest, abdomen and pelvis prior to surgery revealed subtle low-attenuation in the central uterus, as well as small bibasilar pulmonary nodules, which were suspicious for metastatic disease, but only up to 6 mm in size.  PET scan in February 2018 did not reveal hypermetabolic activity in the lung nodules or in the left inguinal  node or portacaval node.  There was mild uptake of the pelvic sidewall, which was felt to represent postoperative changes.   The patient was seen in routine follow-up in March 2019.  Repeat CT chest revealed new pulmonary nodules measuring up to 1.8 cm in the right middle lobe and not in the area of the prior tiny nodules, with a 1.5 cm left lower lobe cavitary nodule.  The previously seen changes of the pelvic sidewall on the prior PET scan had resolved.  PET in May revealed hypermetabolic activity within the pulmonary nodules consistent with metastatic disease.  There was mild hypermetabolic activity within the portacaval node, which was still felt to be reactive.  We recommended 6 cycles of carboplatin and paclitaxel, but when she presented for chemotherapy education on May 29, her sister requested a referral to a pulmonologist for a definitive tissue diagnosis.  Dr. Melvyn Novas at [facility] pulmonology went over all the risks and benefits of having a bronchoscopy.  The patient decided it was not worth the risk to undergo biopsy and decided to proceed with chemotherapy.  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  We did obtain MLH1 methylation analysis and there was hypermethylation of MLH1, so this does not likely represent Lynch syndrome.    She received palliative carboplatin/paclitaxel from June to September 2019, completing 6 cycles.  Repeat CT imaging after 3 cycles of carboplatin/paclitaxel revealed a good response with a decrease in the size and number of pulmonary nodules.  She had chronic hematuria and saw Dr. Nila Nephew, and cystoscopy revealed findings of radiation cystitis.  She has also had recurrent urinary tract infections, so was placed on D-mannose for prevention.  CT chest, abdomen and pelvis in October 2019 revealed stable to slightly decreased metastatic lung lesions with no other sites  of metastatic disease.  Chemotherapy was discontinued and she was placed on tamoxifen 20 mg daily for maintenance.  Repeat CT scans in January 2020 revealed scattered small bilateral pulmonary nodules that were overall grossly unchanged, including a 10 mm irregular nodule of the right upper lobe and two 6 mm nodules of the right lower lobe.  CT imaging in April was fairly stable with just a slight increase in the right middle lobe pulmonary nodule, which was difficult to assess due to motion artifact.  Unfortunately, CT imaging in August revealed considerable enlargement of the right middle lobe pulmonary nodule from 1 cm to 2.3 cm consistent with progressive pulmonary metastasis.  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  [facility] CDx testing reveals MSI high with an increased TMB (tumor mutational burden) making the patient eligible for immunotherapy. Tamoxifen was discontinued.  She was placed on palliative pembrolizumab every 3 weeks in October 2020.  She presented to the emergency department with shortness of breath in November 2020.  CT angiogram chest revealed small filling defects in the right lower lobe and probably right upper lobe pulmonary arteries consistent with small pulmonary emboli. The metastatic lesion within the right middle lobe was stable at 3 cm.  There was scattered subsegmental atelectasis.  The patient was hospitalized at that time, as she presented with hypoxia.  She initially was given enoxaparin and then transitioned to rivaroxaban.    After completion of 6 cycles of pembrolizumab, she had progressive disease within the chest.  CT chest, abdomen, and pelvis in February 2021 revealed a dramatic increase in the size of the dominant right middle lobe pulmonary nodule, now measuring 5.1 x 5.1 cm, previously measuring 2.4 x 2.3  cm.  The smaller pulmonary nodules in the right lower lobe were stable, but one new pulmonary nodule was seen within the central right middle lobe measuring 14 mm.  There was no evidence of local recurrence of endometrial carcinoma in the pelvis or evidence of metastatic disease in the abdomen or pelvis.  The bladder, kidneys and ureters appeared normal.  In light of these results, her treatment was changed to carboplatin/paclitaxel/bevacizumab beginning in February. She presented to the emergency department with vaginal bleeding in March.  This was felt to be hematuria due to urinary tract infection, and she was found to have Klebsiella pneumoniae in the urine and was discharged on [place].  She had anemia felt to be secondary to blood loss and her hemoglobin dropped to 8.8, but was back up to 9.5 at the time of discharge. CT chest angiogram did not reveal any evidence of pulmonary embolism.  There was an interval decrease in the pulmonary nodules.  The rivaroxaban was resumed on discharge.  Bevacizumab was permanently discontinued due to continued bleeding.  She saw Dr. Denman George for a vaginal exam in some time.  No vaginal mass or source of bleeding was identified on vaginal examination.  She had recurrent bleeding, so Dr. Venetia Maxon had held her rivaroxaban.  She was found to have another urinary tract infection, this time with Staphylococcal hemolyticus,  which was treated with nitrofurantoin.     CT chest, abdomen and pelvis  in June of 2021 revealed an excellent response to treatment.  The large right middle lobe mass had resolved, and the right lower lobe pulmonary nodules remain stable at 5 mm.  There was no evidence of metastatic disease in the abdomen or pelvis.  She had had right calf tenderness, redness and warmth and was seen by by Dr. Venetia Maxon.  Ultrasound was negative for DVT.  He felt this represented cellulitis, so treated her with cephalexin 500 mg. CT chest in September 2021 revealed stable small  pulmonary nodules without evidence of metastatic disease within the abdomen and pelvis.  She was doing well at the time, so was scheduled for 3 month follow up with repeat imaging.  The patient presented in November 2021 with headache, nausea and vomiting, as well as dizziness and weight loss.  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 Pathology confirmed metastatic endometrial adenocarcinoma.  She did well postop and was tapered off dexamethasone. She continues to follow with Dr. Mickeal Skinner and MRI brain from September 2022 revealed stable post-surgical and post-treatment appearance of the right cerebellar hemisphere. No new intracranial metastatic lesion is identified. CT chest, abdomen and pelvis from October 28th,2022 revealed no new evidence of metastatic disease in the chest, abdomen or pelvis, with stable 5 mm right lower lobe pulmonary nodules.  There is a right thyroid nodule stable with respect imaged portions less than 1.5 cm on previous imaging, and mild hepatic steatosis.  MRI of the brain in December with Dr. Mickeal Skinner remains stable.  Her last CA  125 in February remains normal.  INTERVAL HISTORY:  Brianna Herring is here for routine follow up and she feels fairly poorly.  She went to the emergency room again on 06/20/2022.  She has had severe anemia and is requiring transfusions. Stool hemoccults were negative in the emergency room but she does report blood in the stool . We are trying to get her in with a Gastroenterologist but there is a delay in getting an appointment.She reports having a fever and her sister reports its been on and off and spikes for 2-3 weeks. We know she had a UTI with klebsiella and that was treated, but continues to have fevers. She complains of diarrhea and so we will obtain stool cultures and repeat UA and culture today. I will send a note to Dr. Mickeal Skinner and she will have an MRI of  the brain at [facility]. She had two units of packed red blood cells on 06/18/2022, and her hemoglobin has gone from 7.7 to 8.7. We now see immature granulocytes on the blood smear, which could be from infection, or could indicate a more ominous development. Her  appetite is good, but she has lost 4 pounds since last week.. I am concerned about the possibility of chemotherapy induced myelodysplasia or leukemia. I have recommended she get a bone marrow test next week  I will check a uric acid We will see her in one week to review the results. I will refill her hydrocodone 5 mg for pain, and she will use immodium for diarrhea. She denies nausea, vomiting, bowel issues, or abdominal pain.  She denies sore throat, cough, dyspnea, or chest pain.  REVIEW OF SYSTEMS:  Review of Systems  Constitutional: Negative.  Negative for appetite change, chills, fatigue, fever and unexpected weight change.  HENT:  Negative.    Eyes: Negative.   Respiratory: Negative.  Negative for chest tightness, cough, hemoptysis, shortness of breath and wheezing.   Cardiovascular: Negative.  Negative for chest pain, leg swelling and palpitations.  Gastrointestinal: Negative.  Negative for abdominal distention, abdominal pain, blood in stool, constipation, diarrhea, nausea and vomiting.  Endocrine: Negative.   Genitourinary: Negative.  Negative for difficulty urinating, dysuria, frequency and hematuria.   Musculoskeletal: Negative.  Negative for arthralgias, back pain, flank pain, gait problem and myalgias.  Skin: Negative.   Neurological:  Negative for extremity weakness, gait problem, headaches, light-headedness, numbness, seizures and speech difficulty.  Hematological: Negative.   Psychiatric/Behavioral: Negative.  Negative for depression and sleep disturbance. The patient is not nervous/anxious.      VITALS:  Blood pressure (!) 112/56, pulse (!) 116, temperature 98.2 F (36.8 C), temperature source Oral, resp. rate 18, height  '5\' 6"'  (1.676 m), weight 198 lb 3.2 oz (89.9 kg), SpO2 95 %.  Wt Readings from Last 3 Encounters:  07/14/22 192 lb 12.8 oz (87.5 kg)  07/07/22 188 lb (85.3 kg)  07/02/22 193 lb (87.5 kg)    Body mass index is 31.99 kg/m.  Performance status (ECOG): 1 - symptomatic but ambulatory  PHYSICAL EXAM:  Physical Exam Constitutional:      General: She is not in acute distress.    Appearance: Normal appearance. She is normal weight.  HENT:     Head: Normocephalic and atraumatic.  Eyes:     General: No scleral icterus.    Extraocular Movements: Extraocular movements intact.     Conjunctiva/sclera: Conjunctivae normal.     Pupils: Pupils are equal, round, and reactive to light.  Cardiovascular:     Rate and Rhythm: Regular rhythm. Tachycardia present.     Pulses: Normal pulses.     Heart sounds: Normal heart sounds. No murmur heard.    No friction rub. No gallop.  Pulmonary:     Effort: Pulmonary effort is normal. No respiratory distress.     Breath sounds: Normal breath sounds.  Abdominal:     General: Bowel sounds are normal. There is no distension.     Palpations: Abdomen is soft. There is no hepatomegaly, splenomegaly or mass.     Tenderness: There is no abdominal tenderness.  Musculoskeletal:        General: Normal range of motion.     Cervical back: Normal range of motion and neck supple.     Right lower leg: No edema.     Left lower leg: No edema.  Lymphadenopathy:     Cervical: No cervical adenopathy.  Skin:    General: Skin is warm and dry.  Neurological:     General: No focal deficit present.     Mental Status: She is alert and oriented to person, place, and time. Mental status is at baseline.  Psychiatric:        Mood and Affect: Mood normal.        Behavior: Behavior normal.        Thought Content: Thought content normal.        Judgment: Judgment normal.    LABS:      Latest Ref Rng & Units 07/14/2022   12:00 AM 07/07/2022   12:00 AM 06/30/2022   12:00 AM  CBC   WBC  4.5     4.9     7.3      Hemoglobin 12.0 - 16.0 9.1     8.2     9.0  Hematocrit 36 - 46 '28     25     28      ' Platelets 150 - 400 K/uL 74     113     145         This result is from an external source.      Latest Ref Rng & Units 07/14/2022   12:00 AM 07/07/2022   12:00 AM 06/30/2022   12:00 AM  CMP  BUN 4 - '21 9     9     10      ' Creatinine 0.5 - 1.1 0.5     0.5     0.5      Sodium 137 - 147 137     135     133      Potassium 3.5 - 5.1 mEq/L 3.6     3.8     4.0      Chloride 99 - 108 97     98     99      CO2 13 - '22 29     28     26      ' Calcium 8.7 - 10.7 9.2     9.3     9.3      Alkaline Phos 25 - 125 95     94     142      AST 13 - 35 23     34     42      ALT 7 - 35 U/L 23     30     44         This result is from an external source.    Lab Results  Component Value Date   CAN125 7.2 12/10/2021      STUDIES:  EXAM 06/16/22 CT ANGIOGRAPHY CHEST WITH CONTRAST IMPRESSION No pulmonary embolus Interval increase in size of trace to small volume pericardial effusion. Interval development of trace bilateral, right greater than left, pleural effusion. Couple pulmonary micronodules. No follow-up needed if patient is low risk (and has no known or suspected primary neoplasm). Non-contrast chest CT can  be considered in 12 months if patient is high-risk. This recommendation follows the consensus statement: Guidelines for Management of Incidental Pulmonary Nodules Detected on CT images: From the Fleischner Society 2017; Radiology 2017; [id number]:228-243  EXAM:06/11/22 CT CHEST, ABDOMEN, AND PELVIS WITH CONTRAST IMPRESSION: Trace pericardial effusion, new since prior study. Otherwise stable examination, without evidence of recurrent or progressive metastatic disease. Stbale sub 5 mm pulmonary nodules. No acute intra-abdominal or intrapelvic process. Aortic Atherosclerosis (ICD10-170.0)    EXAM:03/30/2022 MRI HEAD WITHOUT AND WITH CONTRAST  FINDINGS: Brain:   Mild  generalized parenchymal atrophy.   Sequela of prior right suboccipital craniotomy and right cerebellar mass resection. Unchanged mild non-masslike enhancement, and small amount of chronic blood products, at the resection site.   No new enhancing intracranial lesions are identified.   Multifocal T2 FLAIR hyperintense signal abnormality within the cerebral white matter, nonspecific but compatible with moderate chronic small vessel ischemic disease.   There is no acute infarct.   No extra-axial fluid collection.   No midline shift.   Vascular: Maintained flow voids within the proximal large arterial vessels.   Skull and upper cervical spine: Right suboccipital cranioplasty. No focal suspicious marrow lesion. Incompletely assessed cervical spondylosis.   Sinuses/Orbits: No mass or acute finding within the imaged orbits. Tiny mucous retention cyst within the right maxillary sinus.   Other:  Trace fluid within the right mastoid air cells.   IMPRESSION: Stable post-treatment changes within the right cerebellar hemisphere.   No new intracranial metastasis is identified.   Moderate chronic small vessel ischemic disease within the cerebral white matter, stable.   HISTORY:   Allergies: No Known Allergies  Current Medications: Current Outpatient Medications  Medication Sig Dispense Refill   acetaminophen (TYLENOL) 500 MG tablet Take 500 mg by mouth every 6 (six) hours as needed for pain or headache.     Calcium Carbonate (CALTRATE 600 PO) Take 1 tablet by mouth in the morning and at bedtime.     HYDROcodone-acetaminophen (NORCO/VICODIN) 5-325 MG tablet Take 1 tablet by mouth every 4 (four) hours as needed.     metoprolol succinate (TOPROL-XL) 25 MG 24 hr tablet Take 1 tablet (25 mg total) by mouth 2 (two) times daily. 180 tablet 3   Multiple Vitamin (MULTIVITAMIN WITH MINERALS) TABS tablet Take 1 tablet by mouth daily. Centrum Silver     ondansetron (ZOFRAN) 4 MG tablet Take 1  tablet (4 mg total) by mouth every 4 (four) hours as needed for nausea. 90 tablet 3   potassium chloride SA (KLOR-CON M) 20 MEQ tablet Take 40 mEq by mouth 2 (two) times daily. Takes 5 tablets daily while taking Torsemide     Probiotic Product (PROBIOTIC PO) Take 1 capsule by mouth daily.     prochlorperazine (COMPAZINE) 10 MG tablet Take 1 tablet (10 mg total) by mouth every 6 (six) hours as needed for nausea or vomiting. 90 tablet 3   No current facility-administered medications for this visit.     ASSESSMENT & PLAN:   Assessment: 1. History of stage IB high-grade endometrial carcinoma treated with hysterectomy/bilateral salpingo-oophorectomy, followed by adjuvant radiation and brachytherapy in 2018.   2. 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  She had progression of her disease in August 2020. As her tumor was MSI-high, she was placed on palliative pembrolizumab. Unfortunately, she had progressive disease after 6 cycles of pembrolizumab.  She was therefore placed on palliative carboplatin/paclitaxel/bevacizumb. Bevacizumab was discontinued due to persistent vaginal bleeding/hematuria and epistaxis.  She completed 6 cycles of carboplatin/paclitaxel in May 2020. CT imaging of August 2023 remains stable.   3. Pulmonary emboli in February 2021.  She is at increased risk for thrombosis due to her malignancy, but due to recurrent bleeding, we discontinued rivaroxaban .  She has not had evidence of recurrent thrombosis.  4. Solitary right cerebellar metastasis, status post preoperative stereotactic radio surgery followed by surgical resection.   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  She continues to follow with Dr. Mickeal Skinner of Neuro-Oncology.  MRI in December 2022 revealed stable post-surgical and post-treatment appearance of the right  cerebellar hemisphere. No new intracranial metastatic lesion is identified.   5. Severe anemia, much worse than previous. She has no evidence of hemolytic anemia, nutritional deficiency, or multiple myeloma. I therefore think that she is losing blood in the GI tract. However the other possibility would be bone marrow dysfunction. We will schedule a bone marrow for next week.   6.  Multiple bilateral pulmonary nodules which are stable and measuring up to 5 mm in diameter.  7.  Pain and swelling of the right foot. I will check a uric acid but this appears to be cellulitis.  8.  Diarrhea. I will get stool cultures.  9.  Persistent fevers,  perhaps from the foot infection. We will monitor.  10.  Immature granulocytes see on peripheral blood smear, suspicious for myelodysplasia but could be from infection. She will have a bone marrow done.   Plan:   I will perform a bone marrow and recheck her labs next week to review the results. We will continue to try to get her in with a GI specialist ASAP. She was transfused with 2 units of red packed blood cells last week.The patient and her sister understand the plans discussed today and are in agreement with them.  She knows to contact our office if she develops concerns prior to her next appointment.   PROCEDURE NOTE  BONE MARROW  A time out was held to confirm this is the correct patient and the correct procedure. She was then placed on her left side and the right posterior iliac crest was prepped and draped in sterile fashion. The area was infiltrated with 1% xylocaine and as aspirate was obtained without difficulty. I then used a Jamshidi needle to obtain a bone marrow biopsy. The patient tolerated the procedure well.   I provided 30 minutes of face-to-face time during this this encounter and > 50% was spent counseling as documented under my assessment and plan.

## 2022-06-24 ENCOUNTER — Ambulatory Visit: Payer: Medicare Other | Admitting: Dietician

## 2022-06-24 ENCOUNTER — Ambulatory Visit (HOSPITAL_COMMUNITY)
Admission: RE | Admit: 2022-06-24 | Discharge: 2022-06-24 | Disposition: A | Discharge status: Home / Self Care | Payer: Medicare Other | Source: Ambulatory Visit | Attending: Radiation Oncology | Admitting: Radiation Oncology

## 2022-06-24 ENCOUNTER — Other Ambulatory Visit: Payer: Self-pay

## 2022-06-24 DIAGNOSIS — C7931 Secondary malignant neoplasm of brain: Secondary | ICD-10-CM

## 2022-06-24 DIAGNOSIS — D4621 Refractory anemia with excess of blasts 1: Secondary | ICD-10-CM | POA: Diagnosis not present

## 2022-06-24 LAB — CULTURE, BLOOD (ROUTINE X 2)
Culture: NO GROWTH
Culture: NO GROWTH

## 2022-06-24 MED ORDER — GADOBUTROL 1 MMOL/ML IV SOLN
10.0000 mL | Freq: Once | INTRAVENOUS | Status: AC | PRN
  Administered 2022-06-24: 10 mL via INTRAVENOUS

## 2022-06-24 NOTE — Progress Notes (Signed)
Nutrition Follow Up:   Attempted to reach patient at home telephone number, [facility] on voice mail.  She is also scheduled today for MRI.  Will plan to try to follow up with patient 06/29/22 to assess tolerance of samples and check on PO intake.  Sister was in to see me at nutrition open house on 06/22/22.  Ava reports that the Boost Chocolate ran right through her. I provided samples of [place] Onion soup, Ensure & Boost vanilla and strawberry and Pedialyte with coupons. Patient has been in ER and treated with Abx for UTI and bronchitis since last nutrition consult.   Labs:  06/23/22  Na 134,  Hgb 8.7 (fluctuating with infusions PRBC), glucose 209   Anthropometrics:  Weight loss continues down 4# since last week   Height: 66" Weight: 06/23/22  198.3# 06/17/22  202.7#  06/09/22  203.9#  Cyndi Ashlin Hidalgo, RDN, LDN Registered Dietitian, [place] Part Time Remote (Usual office hours: Tuesday-Thursday) Mobile: [phone] Remote Office: [phone]

## 2022-06-25 ENCOUNTER — Telehealth: Payer: Self-pay

## 2022-06-25 LAB — URINE CULTURE: Culture: 30000 — AB

## 2022-06-25 NOTE — Telephone Encounter (Signed)
-----   Message from Derwood Kaplan, MD sent at 06/25/2022  3:40 PM EDT ----- Regarding: call Tell Ava, sister, that urine looks okay, just a few bacteria and the type from the skin, not a UTI

## 2022-06-25 NOTE — Telephone Encounter (Signed)
Ava notified.

## 2022-06-28 ENCOUNTER — Inpatient Hospital Stay: Payer: Medicare Other

## 2022-06-28 LAB — SURGICAL PATHOLOGY

## 2022-06-29 ENCOUNTER — Ambulatory Visit: Payer: Medicare Other | Admitting: Dietician

## 2022-06-29 ENCOUNTER — Telehealth: Payer: Self-pay

## 2022-06-29 NOTE — Telephone Encounter (Signed)
Ladon Heney,RN: I notified Ava, pt's sister of Dr Remi Deter recommendation below. She verbalized understanding.  Dr Hinton Rao: I would try every 4 hours scheduled and if that doesn't work by Thursday, we can try oxycodone.    Unrelieved pain w/ hydrocodone every 6 hrs scheduled. Her pain is in hips and legs per her sister. Sister asking for recommendations. I sent above message to Dr Hinton Rao and Rhunette Croft @ [phone]

## 2022-06-29 NOTE — Progress Notes (Signed)
Nutrition Follow-up:   Called patient at her telephone number to follow up on PO intake and tolerance of ONS samples that I gave to sister AVA.  Patient said she is very weak, trying to eat better but sleeping a lots.  When I asked about the samples she put Ava on the phone.  Ava said she tolerated the Ensure complete and had one for breakfast.  Sister reports she has been eating ok had pot roast and sweet potatoes yesterday and used the Ensure complete for breakfast.  She's been eating chicken.  She like the Pedialyte but didn't buy anymore and isn't using now as patient is no longer having diarrhea she's now having trouble with constipation. Ava is trying to encourage some yogurt since patient is taking antibiotics.   Medications: ABX   Labs: 8/16/233  Na 134, Albumin 3.4, Hgb 8.7  Anthropometrics:  Weight loss continues down another 5# past week  Height: 66" Weight:  06/23/22  198# 06/09/22  203.9# 04/20/22  214# 04/01/22  220.9# UBW: 220-225 BMI: 32.9   Estimated Energy Needs  Kcals: 2200-2700 Protein: 90-108 Fluid: 3L  NUTRITION DIAGNOSIS: Inadequate energy intake r/t fatigue and anemia with increased energy needs for  cancer AEB significant weight loss and poor report energy intake.    MALNUTRITION DIAGNOSIS: Suspect Moderate malnutrition of chronic disease  MONITORING, EVALUATION, GOAL: weight, PO intake, Nutrition Impact Symptoms, labs  Intervention:  Encourage use of  1.5 calorie ONS as meal replacement when too tired to eat.  Encouraged hot liquids for constipation.  Goal is weight maintenance, fluid intake greater than 100oz.  Encouraged use of yogurts for increasing protein calories and probiotics.    Next Visit: Telephone follow up next month  April Manson, RDN, LDN Registered Dietitian, [place] Part Time Remote (Usual office hours: Tuesday-Thursday) Cell: [phone]

## 2022-06-30 ENCOUNTER — Inpatient Hospital Stay (INDEPENDENT_AMBULATORY_CARE_PROVIDER_SITE_OTHER): Payer: Medicare Other | Admitting: Oncology

## 2022-06-30 ENCOUNTER — Ambulatory Visit
Admission: RE | Admit: 2022-06-30 | Discharge: 2022-06-30 | Disposition: A | Discharge status: Home / Self Care | Payer: Medicare Other | Source: Ambulatory Visit | Attending: Radiation Oncology | Admitting: Radiation Oncology

## 2022-06-30 ENCOUNTER — Inpatient Hospital Stay: Payer: Medicare Other

## 2022-06-30 ENCOUNTER — Encounter (HOSPITAL_COMMUNITY): Payer: Self-pay | Admitting: Oncology

## 2022-06-30 ENCOUNTER — Encounter: Payer: Self-pay | Admitting: Oncology

## 2022-06-30 VITALS — BP 113/66 | HR 126 | Temp 98.3°F | Resp 21 | Ht 66.0 in | Wt 193.6 lb

## 2022-06-30 DIAGNOSIS — D469 Myelodysplastic syndrome, unspecified: Secondary | ICD-10-CM

## 2022-06-30 DIAGNOSIS — C7931 Secondary malignant neoplasm of brain: Secondary | ICD-10-CM

## 2022-06-30 DIAGNOSIS — D649 Anemia, unspecified: Secondary | ICD-10-CM

## 2022-06-30 DIAGNOSIS — C7801 Secondary malignant neoplasm of right lung: Secondary | ICD-10-CM

## 2022-06-30 DIAGNOSIS — C541 Malignant neoplasm of endometrium: Secondary | ICD-10-CM | POA: Diagnosis not present

## 2022-06-30 DIAGNOSIS — C7802 Secondary malignant neoplasm of left lung: Secondary | ICD-10-CM

## 2022-06-30 LAB — CBC: RBC: 3.36 — AB (ref 3.87–5.11)

## 2022-06-30 LAB — CBC AND DIFFERENTIAL
HCT: 28 — AB (ref 36–46)
Hemoglobin: 9 — AB (ref 12.0–16.0)
Neutrophils Absolute: 5.11
Platelets: 145 10*3/uL — AB (ref 150–400)
WBC: 7.3

## 2022-06-30 LAB — BASIC METABOLIC PANEL
BUN: 10 (ref 4–21)
CO2: 26 — AB (ref 13–22)
Chloride: 99 (ref 99–108)
Creatinine: 0.5 (ref 0.5–1.1)
Glucose: 231
Potassium: 4 mEq/L (ref 3.5–5.1)
Sodium: 133 — AB (ref 137–147)

## 2022-06-30 LAB — COMPREHENSIVE METABOLIC PANEL
Albumin: 3.5 (ref 3.5–5.0)
Calcium: 9.3 (ref 8.7–10.7)

## 2022-06-30 LAB — HEPATIC FUNCTION PANEL
ALT: 44 U/L — AB (ref 7–35)
AST: 42 — AB (ref 13–35)
Alkaline Phosphatase: 142 — AB (ref 25–125)
Bilirubin, Total: 1.1

## 2022-06-30 NOTE — Progress Notes (Signed)
[facility]  [address] 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 female with recurrent endometrial carcinoma with lung metastasis.  She was originally diagnosed with stage IB (T1b N0 M0) high-grade endometrial carcinoma in late 2017. She had a robotic hysterectomy and bilateral salpingo-oophorectomy in January 2018.  Pathology revealed a 4 cm, grade 3, endometrioid adenocarcinoma with over 50% invasion of the myometrium and lymphovascular invasion.  Six lymph nodes were negative for metastasis.  Margins were clear.  She received adjuvant external beam radiation, completed in April 2018, followed by vaginal brachytherapy to the vaginal cuff.   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  MSI was high.  A referral to [facility] was ordered in 2018, but the patient never followed through.   CT chest, abdomen and pelvis prior to surgery revealed subtle low-attenuation in the central uterus, as well as small bibasilar pulmonary nodules, which were suspicious for metastatic disease, but only up to 6 mm in size.  PET scan in February 2018 did not reveal hypermetabolic activity in the lung nodules or in the left inguinal node or portacaval node.  There was mild uptake of the pelvic sidewall, which was felt to represent postoperative changes.   The patient was seen in routine follow-up in March 2019.  Repeat CT chest revealed new pulmonary nodules measuring up to 1.8 cm in the right middle lobe and not in the area of the prior tiny nodules, with a 1.5 cm left lower lobe cavitary nodule.  The previously seen changes of the pelvic  sidewall on the prior PET scan had resolved.  PET in May revealed hypermetabolic activity within the pulmonary nodules consistent with metastatic disease.  There was mild hypermetabolic activity within the portacaval node, which was still felt to be reactive.  We recommended 6 cycles of carboplatin and paclitaxel, but when she presented for chemotherapy education on May 29, her sister requested a referral to a pulmonologist for a definitive tissue diagnosis.  Dr. Melvyn Novas at [facility] pulmonology went over all the risks and benefits of having a bronchoscopy.  The patient decided it was not worth the risk to undergo biopsy and decided to proceed with chemotherapy.  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  We did obtain MLH1 methylation analysis and there was hypermethylation of MLH1, so this does not likely represent Lynch syndrome.    She received palliative carboplatin/paclitaxel from June to September 2019, completing 6 cycles.  Repeat CT imaging after 3 cycles of carboplatin/paclitaxel revealed a good response with a decrease in the size and number of pulmonary nodules.  She had chronic hematuria and saw Dr. Nila Nephew, and cystoscopy revealed findings of radiation cystitis.  She has also had recurrent urinary tract infections, so was placed on D-mannose for prevention.  CT chest, abdomen and pelvis in October 2019 revealed stable to slightly decreased metastatic lung lesions with no other sites of metastatic disease.  Chemotherapy was discontinued and she was placed on tamoxifen 20 mg daily for maintenance.  Repeat CT scans in January  2020 revealed scattered small bilateral pulmonary nodules that were overall grossly unchanged, including a 10 mm irregular nodule of the right upper lobe and two 6 mm nodules of the right lower lobe.  CT imaging in April was fairly stable with just a slight increase in the  right middle lobe pulmonary nodule, which was difficult to assess due to motion artifact.  Unfortunately, CT imaging in August revealed considerable enlargement of the right middle lobe pulmonary nodule from 1 cm to 2.3 cm consistent with progressive pulmonary metastasis.  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  [facility] CDx testing reveals MSI high with an increased TMB (tumor mutational burden) making the patient eligible for immunotherapy. Tamoxifen was discontinued.  She was placed on palliative pembrolizumab every 3 weeks in October 2020.  She presented to the emergency department with shortness of breath in November 2020.  CT angiogram chest revealed small filling defects in the right lower lobe and probably right upper lobe pulmonary arteries consistent with small pulmonary emboli. The metastatic lesion within the right middle lobe was stable at 3 cm.  There was scattered subsegmental atelectasis.  The patient was hospitalized at that time, as she presented with hypoxia.  She initially was given enoxaparin and then transitioned to rivaroxaban.    After completion of 6 cycles of pembrolizumab, she had progressive disease within the chest.  CT chest, abdomen, and pelvis in February 2021 revealed a dramatic increase in the size of the dominant right middle lobe pulmonary nodule, now measuring 5.1 x 5.1 cm, previously measuring 2.4 x 2.3 cm.  The smaller pulmonary nodules in the right lower lobe were stable, but one new pulmonary nodule was seen within the central right middle lobe measuring 14 mm.  There was no evidence of local recurrence of endometrial carcinoma in the pelvis or evidence of metastatic disease in the abdomen or pelvis.  The bladder, kidneys and ureters appeared normal.  In light of these results, her treatment was changed to  carboplatin/paclitaxel/bevacizumab beginning in February. She presented to the emergency department with vaginal bleeding in March.  This was felt to be hematuria due to urinary tract infection, and she was found to have Klebsiella pneumoniae in the urine and was discharged on [place].  She had anemia felt to be secondary to blood loss and her hemoglobin dropped to 8.8, but was back up to 9.5 at the time of discharge. CT chest angiogram did not reveal any evidence of pulmonary embolism.  There was an interval decrease in the pulmonary nodules.  The rivaroxaban was resumed on discharge.  Bevacizumab was permanently discontinued due to continued bleeding.  She saw Dr. Denman George for a vaginal exam in some time.  No vaginal mass or source of bleeding was identified on vaginal examination.  She had recurrent bleeding, so Dr. Venetia Maxon had held her rivaroxaban.  She was found to have another urinary tract infection, this time with Staphylococcal hemolyticus, which was treated with nitrofurantoin.     CT chest, abdomen and pelvis  in June of 2021 revealed an excellent response to treatment.  The large right middle lobe mass had resolved, and the right lower lobe pulmonary nodules remain stable at 5 mm.  There was no evidence of metastatic disease in the abdomen or pelvis.  She had had right calf tenderness, redness and warmth and was seen by by Dr. Venetia Maxon.  Ultrasound was negative for DVT.  He felt this represented cellulitis, so treated her with cephalexin 500 mg. CT chest in September 2021 revealed stable small pulmonary nodules without evidence of metastatic disease within the abdomen and pelvis.  She was doing well at the time, so was scheduled for 3 month follow up with repeat imaging.  The patient presented in November 2021 with headache, nausea and vomiting, as well as dizziness and weight loss.  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 Pathology confirmed metastatic endometrial adenocarcinoma.  She did well postop and was tapered off dexamethasone. She continues to follow with Dr. Mickeal Skinner and MRI brain from September 2022 revealed stable post-surgical and post-treatment appearance of the right cerebellar hemisphere. No new intracranial metastatic lesion is identified. CT chest, abdomen and pelvis from October 28th,2022 revealed no new evidence of metastatic disease in the chest, abdomen or pelvis, with stable 5 mm right lower lobe pulmonary nodules.  There is a right thyroid nodule stable with respect imaged portions less than 1.5 cm on previous imaging, and mild hepatic steatosis.  MRI of the brain in December with Dr. Mickeal Skinner remains stable.  Her last CA  125 in February remains normal.  INTERVAL HISTORY:  Brianna Herring is here for routine follow up and she has been feeling poorly she has had multiple trips to the emergency room and several transfusions for severe anemia.  CT of chest abdomen and pelvis was negative for recurrent metastatic disease and MRI of the brain was stable.  She has had multiple complications including cellulitis of the foot, acute bronchitis, urinary tract infection with Klebsiella, and now increasing bone pain.  I have prescribed hydrocodone 5 mg every 4 hours as needed for the pain.  Stool hemoccults were negative in the emergency room but she does report blood in the stool . We are trying to get her in with a Gastroenterologist but there is a delay in getting an appointment.her cough is improved somewhat and she still has some swelling of the right foot but stable.  She has 2 more days of the antibiotics now.  She is spending at least half of her time in bed now but her fevers are down.  Due to the worsening anemia and appearance of occasional immature granulocytes, I did perform a bone marrow and they are here for those results.  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  The cellularity is 95% and there is a significant hyperplasia of the myeloid series with a left shift.  CD34 blasts by IHC were 8% and 10% by flow cytometry.  This is a multilineage dysplasia with excess blasts, cytogenetics are pending.  I have explained this diagnosis to Brianna Herring and her Sister Brianna Herring.  We could give her supportive care with Procrit injections to lessen her need for transfusions.  The other option would be a CT chemotherapy but I doubt this patient could tolerate that as she is wheelchair-bound and has an  ECOG performance status of 2-3.  The patient is unable to make any decisions.  Her hemoglobin today is 9.0 so she will not need transfusion.  CMP reveals a nonfasting blood sugar of 231, and sodium of 133, with just mild elevation of the liver transaminases.  Her appetite is variable and she has lost another 4-1/2 pounds.  She denies nausea, vomiting, bowel issues, or abdominal pain.  She denies sore  throat, cough, dyspnea, or chest pain.  REVIEW OF SYSTEMS:  Review of Systems  Constitutional:  Positive for fatigue. Negative for appetite change, chills, fever and unexpected weight change.  HENT:  Negative.    Eyes: Negative.   Respiratory: Negative.  Negative for chest tightness, cough, hemoptysis, shortness of breath and wheezing.   Cardiovascular: Negative.  Negative for chest pain, leg swelling and palpitations.  Gastrointestinal: Negative.  Negative for abdominal distention, abdominal pain, blood in stool, constipation, diarrhea, nausea and vomiting.  Endocrine: Negative.   Genitourinary: Negative.  Negative for difficulty urinating, dysuria, frequency and hematuria.   Musculoskeletal: Negative.  Negative for arthralgias, back pain, flank pain, gait problem and myalgias.  Skin: Negative.   Neurological:  Negative for extremity weakness, gait problem, headaches, light-headedness, numbness, seizures and speech difficulty.  Hematological: Negative.    Psychiatric/Behavioral: Negative.  Negative for depression and sleep disturbance. The patient is not nervous/anxious.      VITALS:  Blood pressure 113/66, pulse (!) 126, temperature 98.3 F (36.8 C), temperature source Oral, resp. rate (!) 21, height '5\' 6"'  (1.676 m), weight 193 lb 9.6 oz (87.8 kg), SpO2 96 %.  Wt Readings from Last 3 Encounters:  07/23/22 193 lb (87.5 kg)  07/22/22 192 lb 11.2 oz (87.4 kg)  07/14/22 192 lb 12.8 oz (87.5 kg)    Body mass index is 31.25 kg/m.  Performance status (ECOG): 0 - Asymptomatic  PHYSICAL EXAM:  Physical Exam Constitutional:      General: She is not in acute distress.    Appearance: Normal appearance. She is normal weight.  HENT:     Head: Normocephalic and atraumatic.  Eyes:     General: No scleral icterus.    Extraocular Movements: Extraocular movements intact.     Conjunctiva/sclera: Conjunctivae normal.     Pupils: Pupils are equal, round, and reactive to light.  Cardiovascular:     Rate and Rhythm: Regular rhythm. Tachycardia present.     Pulses: Normal pulses.     Heart sounds: Normal heart sounds. No murmur heard.    No friction rub. No gallop.  Pulmonary:     Effort: Pulmonary effort is normal. No respiratory distress.     Breath sounds: Normal breath sounds.  Abdominal:     General: Bowel sounds are normal. There is no distension.     Palpations: Abdomen is soft. There is no hepatomegaly, splenomegaly or mass.     Tenderness: There is no abdominal tenderness.  Musculoskeletal:        General: Normal range of motion.     Cervical back: Normal range of motion and neck supple.     Right lower leg: No edema.     Left lower leg: No edema.  Lymphadenopathy:     Cervical: No cervical adenopathy.  Skin:    General: Skin is warm and dry.  Neurological:     General: No focal deficit present.     Mental Status: She is alert and oriented to person, place, and time. Mental status is at baseline.  Psychiatric:        Mood and  Affect: Mood normal.        Behavior: Behavior normal.        Thought Content: Thought content normal.        Judgment: Judgment normal.    LABS:      Latest Ref Rng & Units 07/22/2022   12:00 AM 07/14/2022   12:00 AM 07/07/2022   12:00 AM  CBC  WBC  5.6     4.5     4.9      Hemoglobin 12.0 - 16.0 8.1     9.1     8.2      Hematocrit 36 - 46 '25     28     25      ' Platelets 150 - 400 K/uL 54     74     113         This result is from an external source.      Latest Ref Rng & Units 07/22/2022   12:00 AM 07/14/2022   12:00 AM 07/07/2022   12:00 AM  CMP  BUN 4 - '21 9     9     9      ' Creatinine 0.5 - 1.1 0.4     0.5     0.5      Sodium 137 - 147 134     137     135      Potassium 3.5 - 5.1 mEq/L 3.7     3.6     3.8      Chloride 99 - 108 98     97     98      CO2 13 - '22 28     29     28      ' Calcium 8.7 - 10.7 8.6     9.2     9.3      Alkaline Phos 25 - 125 90     95     94      AST 13 - 35 32     23     34      ALT 7 - 35 U/L '18     23     30         ' This result is from an external source.    Lab Results  Component Value Date   CAN125 7.2 12/10/2021      STUDIES:  EXAM 06/16/22 CT ANGIOGRAPHY CHEST WITH CONTRAST IMPRESSION No pulmonary embolus Interval increase in size of trace to small volume pericardial effusion. Interval development of trace bilateral, right greater than left, pleural effusion. Couple pulmonary micronodules. No follow-up needed if patient is low risk (and has no known or suspected primary neoplasm). Non-contrast chest CT can  be considered in 12 months if patient is high-risk. This recommendation follows the consensus statement: Guidelines for Management of Incidental Pulmonary Nodules Detected on CT images: From the Fleischner Society 2017; Radiology 2017; 284:228-243  EXAM:06/11/22 CT CHEST, ABDOMEN, AND PELVIS WITH CONTRAST IMPRESSION: Trace pericardial effusion, new since prior study. Otherwise stable examination, without evidence of recurrent or  progressive metastatic disease. Stbale sub 5 mm pulmonary nodules. No acute intra-abdominal or intrapelvic process. Aortic Atherosclerosis (ICD10-170.0)   HISTORY:   Allergies: No Known Allergies  Current Medications: Current Outpatient Medications  Medication Sig Dispense Refill   acetaminophen (TYLENOL) 500 MG tablet Take 500 mg by mouth every 6 (six) hours as needed for pain or headache.     ampicillin (PRINCIPEN) 500 MG capsule Take 1 capsule (500 mg total) by mouth 4 (four) times daily. 40 capsule 0   Calcium Carbonate (CALTRATE 600 PO) Take 1 tablet by mouth in the morning and at bedtime.     HYDROcodone-acetaminophen (NORCO/VICODIN) 5-325 MG tablet Take 1 tablet by mouth every 4 (four) hours as needed.     metoprolol succinate (TOPROL-XL) 25 MG 24  hr tablet Take 1 tablet (25 mg total) by mouth 2 (two) times daily. 180 tablet 3   Multiple Vitamin (MULTIVITAMIN WITH MINERALS) TABS tablet Take 1 tablet by mouth daily. Centrum Silver     ondansetron (ZOFRAN) 4 MG tablet Take 1 tablet (4 mg total) by mouth every 4 (four) hours as needed for nausea. 90 tablet 3   potassium chloride SA (KLOR-CON M) 20 MEQ tablet Take 40 mEq by mouth 2 (two) times daily. Takes 5 tablets daily while taking Torsemide     Probiotic Product (PROBIOTIC PO) Take 1 capsule by mouth daily.     prochlorperazine (COMPAZINE) 10 MG tablet Take 1 tablet (10 mg total) by mouth every 6 (six) hours as needed for nausea or vomiting. 90 tablet 3   No current facility-administered medications for this visit.     ASSESSMENT & PLAN:   Assessment: 1. History of stage IB high-grade endometrial carcinoma treated with hysterectomy/bilateral salpingo-oophorectomy, followed by adjuvant radiation and brachytherapy in 2018.   2. 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  She had progression of her  disease in August 2020. As her tumor was MSI-high, she was placed on palliative pembrolizumab. Unfortunately, she had progressive disease after 6 cycles of pembrolizumab.  She was therefore placed on palliative carboplatin/paclitaxel/bevacizumb. Bevacizumab was discontinued due to persistent vaginal bleeding/hematuria and epistaxis.  She completed 6 cycles of carboplatin/paclitaxel in May 2020. CT imaging of August 2023 remains stable.   3. Pulmonary emboli in February 2021.  She is at increased risk for thrombosis due to her malignancy, but due to recurrent bleeding, we discontinued rivaroxaban .  She has not had evidence of recurrent thrombosis.  4. Solitary right cerebellar metastasis, status post preoperative stereotactic radio surgery followed by surgical resection.   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  She continues to follow with Dr. Mickeal Skinner of Neuro-Oncology.  MRI in December 2022 revealed stable post-surgical and post-treatment appearance of the right cerebellar hemisphere. No new intracranial metastatic lesion is identified.   5.  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  Severe anemia, much worse than previous. She has no evidence of hemolytic anemia, nutritional deficiency, or multiple myeloma.  However the other possibility would be bone marrow dysfunction. However her white and platelets remain normal. We will schedule a bone marrow for next week. Since her iron test equivocal, I will check a transferrin receptor today.  6.  Multiple bilateral pulmonary nodules which are stable and measuring up to 5 mm in diameter.   Plan:   I have tried to explain the diagnosis of myelodysplasia to the patient and her sister and the fact that she has a serious variant.  We could give her Procrit injections to help her transfusion requirements, but this does have a small risk of thromboembolic disease.  We also discussed the possibility of AC-T chemotherapy.   I explained this would eventually lower her transfusion needs if it is successful and it could delay her transformation to acute leukemia.  I will plan weekly follow-up for now as we try to make decisions and continue supportive care.  She will get CBC, CMP and type and crossmatch weekly.  We will continue to try to get her in with a  GI ASAP. She will be transfused with 2 units of red packed blood cells tomorrow.  Then we can decide if she needs IV iron as well.The patient  and her sister understand the plans discussed today and are in agreement with them.  She knows to contact our office if she develops concerns prior to her next appointment.  I provided 30 minutes of face-to-face time during this this encounter and > 50% was spent counseling as documented under my assessment and plan.

## 2022-07-01 DIAGNOSIS — R072 Precordial pain: Secondary | ICD-10-CM | POA: Insufficient documentation

## 2022-07-01 NOTE — Progress Notes (Signed)
Cardiology Office Note   Date:  07/02/2022   ID:  Brianna, Herring [birth year], MRN [id number]  PCP:  Herring, Brianna Mt, MD  Cardiologist:   None Referring:  Herring, Brianna Mt, MD  Chief Complaint  Patient presents with   Shortness of Breath      History of Present Illness: Brianna Herring is a 66 y.o. female who presents for evaluation of chest pain.  I saw her previously for this.  She has been diagnosed with endometrial cancer with brian mets.  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  They are considering Procrit to avoid transfusions but they are concerned about the possibility of thrombosis.  A coronary CT was ordered.    I had previously seen her because of some vague left arm discomfort when she was hospitalized.  I tried to do a coronary CT but her heart rate was too elevated and we could not get it down.  Since then she has had the above diagnosis.  She gets around slowly with a walker in her home.  She does always have a heart rate that is about 110 or higher when she exerts herself.  She has breathlessness but there is no apparent oxygen desaturations.  She is not having any new pain.  She has had some right leg mild swelling with some possible cellulitis and is on antibiotics for this.   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[place])    History of radiation therapy 03/02/17-03/16/17   vaginal cuff treated to 18 Gy with 3 fractions of 6 Gy   Hyperglycemia    Labile blood pressure    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   Morbid obesity ([place])    Multiple lung nodules on CT 03/14/2018   CT 11/15/16  MPN's largest 6 mm - PET 12/15/16 neg but largest < 84m - CT chest 01/31/18  Largest ?RML proximal/central  @ 1.8 x 1.5 and LLL peripheral cavitary x  1.5 x1.4  - PET 03/22/18 :  Two distinct nodules, def growing since 01/31/18 both hypermetabolic    Phlebitis alone left leg  4-5 yrs ago   Pulmonary embolism on right (HCC)    Redness and swelling of lower leg    [place]/18[place]   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) ([facility] 01/18/2018    Past Surgical History:  Procedure Laterality Date   APPLICATION OF CRANIAL NAVIGATION N/A 10/08/2020   Procedure: APPLICATION OF CRANIAL NAVIGATION;  Surgeon: TVallarie Mare MD;  [facility]  Service: Neurosurgery;  Laterality: N/A;   CRANIOTOMY N/A 10/08/2020   Procedure: CRANIOTOMY - Suboccipital TUMOR EXCISION - Brain Lab;  Surgeon: TVallarie Mare MD;  [place] [facility]  Service: Neurosurgery;  Laterality: N/A;  suboccipital   ROBOTIC ASSISTED TOTAL HYSTERECTOMY WITH BILATERAL SALPINGO OOPHERECTOMY N/A 11/18/2016   Procedure: XI ROBOTIC ASSISTED TOTAL HYSTERECTOMY WITH BILATERAL SALPINGO OOPHORECTOMY;  Surgeon: EEveritt Amber MD;  Location: [facility];  Service: Gynecology;  Laterality:  N/A;   SENTINEL NODE BIOPSY N/A 11/18/2016   Procedure: SENTINEL NODE BIOPSY;  Surgeon: Everitt Amber, MD;  Location: [facility];  Service: Gynecology;  Laterality: N/A;     Current Outpatient Medications  Medication Sig Dispense Refill   acetaminophen (TYLENOL) 500 MG tablet Take 500 mg by mouth every 6 (six) hours as needed for pain or headache.     Calcium Carbonate (CALTRATE 600 PO) Take 1 tablet by mouth in the morning and at bedtime.     HYDROcodone-acetaminophen (NORCO/VICODIN) 5-325 MG tablet Take 1 tablet by mouth every 4 (four) hours as needed.     Multiple Vitamin (MULTIVITAMIN  WITH MINERALS) TABS tablet Take 1 tablet by mouth daily. Centrum Silver     ondansetron (ZOFRAN) 4 MG tablet Take 1 tablet (4 mg total) by mouth every 4 (four) hours as needed for nausea. 90 tablet 3   potassium chloride SA (KLOR-CON M) 20 MEQ tablet Take 40 mEq by mouth 2 (two) times daily. Takes 5 tablets daily while taking Torsemide     Probiotic Product (PROBIOTIC PO) Take 1 capsule by mouth daily.     prochlorperazine (COMPAZINE) 10 MG tablet Take 1 tablet (10 mg total) by mouth every 6 (six) hours as needed for nausea or vomiting. 90 tablet 3   sulfamethoxazole-trimethoprim (BACTRIM DS) 800-160 MG tablet Take 1 tablet by mouth daily. 10 tablet 0   metoprolol succinate (TOPROL-XL) 25 MG 24 hr tablet Take 1 tablet (25 mg total) by mouth 2 (two) times daily. 180 tablet 3   No current facility-administered medications for this visit.    Allergies:   Patient has no known allergies.    ROS:  Please see the history of present illness.   Otherwise, review of systems are positive for none.   All other systems are reviewed and negative.    PHYSICAL EXAM: VS:  BP 122/72   Pulse (!) 123   Ht _0  (1.676 m)   Wt 193 lb (87.5 kg)   LMP  (LMP Unknown)   SpO2 93%   BMI 31.15 kg/m  , BMI Body mass index is 31.15 kg/m. GEN:  No distress, chronically ill appearing NECK:  No jugular venous distention at 90 degrees, waveform within normal limits, carotid upstroke brisk and symmetric, no bruits, no thyromegaly LYMPHATICS:  No cervical adenopathy LUNGS:  Clear to auscultation bilaterally BACK:  No CVA tenderness CHEST:  Unremarkable HEART:  S1 and S2 within normal limits, no S3, no S4, no clicks, no rubs, no murmurs ABD:  Positive bowel sounds normal in frequency in pitch, no bruits, no rebound, no guarding, unable to assess midline mass or bruit with the patient seated. EXT:  2 plus pulses throughout, mild right greater than left leg edema, no cyanosis no clubbing SKIN:  No rashes no  nodules NEURO:  Cranial nerves II through XII grossly intact, motor grossly intact throughout PSYCH:  Cognitively intact, oriented to person place and time   EKG:  EKG is not not ordered today.   Recent Labs: 05/17/2022: TSH 1.370 06/30/2022: ALT 44; BUN 10; Creatinine 0.5; Hemoglobin 9.0; Platelets 145; Potassium 4.0; Sodium 133    Lipid Panel    Component Value Date/Time   CHOL 127 05/17/2022 1256   TRIG 61 05/17/2022 1256   HDL 54 05/17/2022 1256   CHOLHDL 2.4 05/17/2022 1256   LDLCALC 60 05/17/2022 1256      Wt Readings from Last 3 Encounters:  07/02/22 193 lb (87.5 kg)  06/30/22 193 lb  9.6 oz (87.8 kg)  06/23/22 198 lb 3.2 oz (89.9 kg)      Other studies Reviewed: Additional studies/ records that were reviewed today include: Oncology records . Review of the above records demonstrates:  Please see elsewhere in the note.     ASSESSMENT AND PLAN:  CHEST PAIN: Her chest discomfort was atypical.  She is not a good candidate for further imaging any particular invasive imaging.  She would not be a good candidate for any therapy and she is not having any ongoing symptoms.  I am not going to attempt further imaging.   TACHYCARDIA: She has had longstanding sinus tachycardia.  I am going to increase her beta-blocker slightly but suspect this is multifactorial.  ANEMIA: There was a question whether she could use Procrit with some hesitancy because of the potential for thrombosis.  I would not see a cardiac reason she could not use this if she and her oncologist decide.  I discussed this with patient today.  I will send this message.   Current medicines are reviewed at length with the patient today.  The patient does not have concerns regarding medicines.  The following changes have been made:  None  Labs/ tests ordered today include: None  No orders of the defined types were placed in this encounter.    Disposition:   FU with me in six months. Ronnell Guadalajara, MD  07/02/2022 5:27 PM    [facility]

## 2022-07-02 ENCOUNTER — Telehealth: Payer: Self-pay

## 2022-07-02 ENCOUNTER — Encounter: Payer: Self-pay | Admitting: Cardiology

## 2022-07-02 ENCOUNTER — Ambulatory Visit (INDEPENDENT_AMBULATORY_CARE_PROVIDER_SITE_OTHER): Payer: Medicare Other | Admitting: Cardiology

## 2022-07-02 VITALS — BP 122/72 | HR 123 | Ht 66.0 in | Wt 193.0 lb

## 2022-07-02 DIAGNOSIS — R072 Precordial pain: Secondary | ICD-10-CM

## 2022-07-02 MED ORDER — METOPROLOL SUCCINATE ER 25 MG PO TB24: 25.0000 mg | ORAL_TABLET | Freq: Two times a day (BID) | ORAL | 3 refills | Status: AC

## 2022-07-02 NOTE — Telephone Encounter (Signed)
-----   Message from Derwood Kaplan, MD sent at 06/30/2022 10:35 AM EDT ----- Regarding: call Tell her BS 231 but rest looks good

## 2022-07-02 NOTE — Patient Instructions (Signed)
Medication Instructions:   -Increase metoprolol succinate (toprol-xl) '25mg'$  twice daily.  *If you need a refill on your cardiac medications before your next appointment, please call your pharmacy*   Follow-Up: At [facility], you and your health needs are our priority.  As part of our continuing mission to provide you with exceptional heart care, we have created designated Provider Care Teams.  These Care Teams include your primary Cardiologist (physician) and Advanced Practice Providers (APPs -  Physician Assistants and Nurse Practitioners) who all work together to provide you with the care you need, when you need it.  We recommend signing up for the patient portal called "MyChart".  Sign up information is provided on this After Visit Summary.  MyChart is used to connect with patients for Virtual Visits (Telemedicine).  Patients are able to view lab/test results, encounter notes, upcoming appointments, etc.  Non-urgent messages can be sent to your provider as well.   To learn more about what you can do with MyChart, go to [email or web address].    Your next appointment:   6 month(s)  The format for your next appointment:   In Person  Provider:   Minus Breeding, MD

## 2022-07-07 ENCOUNTER — Inpatient Hospital Stay: Payer: Medicare Other

## 2022-07-07 ENCOUNTER — Other Ambulatory Visit: Payer: Self-pay

## 2022-07-07 ENCOUNTER — Inpatient Hospital Stay (INDEPENDENT_AMBULATORY_CARE_PROVIDER_SITE_OTHER): Payer: Medicare Other | Admitting: Hematology and Oncology

## 2022-07-07 ENCOUNTER — Encounter: Payer: Self-pay | Admitting: Hematology and Oncology

## 2022-07-07 VITALS — BP 108/73 | HR 106 | Temp 99.2°F | Resp 20 | Ht 66.0 in | Wt 188.0 lb

## 2022-07-07 DIAGNOSIS — D469 Myelodysplastic syndrome, unspecified: Secondary | ICD-10-CM | POA: Diagnosis not present

## 2022-07-07 DIAGNOSIS — C7801 Secondary malignant neoplasm of right lung: Secondary | ICD-10-CM | POA: Diagnosis not present

## 2022-07-07 DIAGNOSIS — C541 Malignant neoplasm of endometrium: Secondary | ICD-10-CM | POA: Diagnosis not present

## 2022-07-07 DIAGNOSIS — D649 Anemia, unspecified: Secondary | ICD-10-CM

## 2022-07-07 DIAGNOSIS — D696 Thrombocytopenia, unspecified: Secondary | ICD-10-CM | POA: Insufficient documentation

## 2022-07-07 DIAGNOSIS — C7931 Secondary malignant neoplasm of brain: Secondary | ICD-10-CM | POA: Diagnosis not present

## 2022-07-07 DIAGNOSIS — D4621 Refractory anemia with excess of blasts 1: Secondary | ICD-10-CM | POA: Diagnosis not present

## 2022-07-07 LAB — CBC AND DIFFERENTIAL
HCT: 25 — AB (ref 36–46)
Hemoglobin: 8.2 — AB (ref 12.0–16.0)
Neutrophils Absolute: 3.04
Platelets: 113 10*3/uL — AB (ref 150–400)
WBC: 4.9

## 2022-07-07 LAB — COMPREHENSIVE METABOLIC PANEL
Albumin: 3.5 (ref 3.5–5.0)
Calcium: 9.3 (ref 8.7–10.7)

## 2022-07-07 LAB — HEPATIC FUNCTION PANEL
ALT: 30 U/L (ref 7–35)
AST: 34 (ref 13–35)
Alkaline Phosphatase: 94 (ref 25–125)
Bilirubin, Total: 0.8

## 2022-07-07 LAB — BASIC METABOLIC PANEL
BUN: 9 (ref 4–21)
CO2: 28 — AB (ref 13–22)
Chloride: 98 — AB (ref 99–108)
Creatinine: 0.5 (ref 0.5–1.1)
Glucose: 190
Potassium: 3.8 mEq/L (ref 3.5–5.1)
Sodium: 135 — AB (ref 137–147)

## 2022-07-07 LAB — CBC: RBC: 3.08 — AB (ref 3.87–5.11)

## 2022-07-07 LAB — PREPARE RBC (CROSSMATCH)

## 2022-07-07 NOTE — Assessment & Plan Note (Signed)
Solitary right cerebellar metastasis, status post preoperative stereotactic radiosurgery followed by surgical resection.Pathology was consistent with adenocarcinoma from the endometrium.She continues to follow with Dr. Mickeal Skinner of Neuro-Oncology. MRI on August 17 revealed stable post-surgical and post-treatment appearance of the right cerebellar hemisphere. No new intracranial metastatic lesion is identified.  Stable white matter changes consistent with chronic microvascular ischemia seen.

## 2022-07-07 NOTE — Progress Notes (Signed)
[id number] TALKED TO LAB TO CONFIRM THAT THIS PATIENT HAS AN APPT ON 07/07/22 FOR TYPE AND CROSSMATCH FOR POSSIBLE BLOOD ON 07/08/22. THE LAB STATED THAT THEY WOULD CHECK FOR AN ORDER FOR THIS PATIENT. THE PATIENT IS DOWN FOR A POSSIBLE BLOOD TRANSFUSION. CODY IN LAB CONFIRMED THAT HE WOULD CHECK FOR AN ORDER FOR THIS PATIENT FOR THE ABOVE.

## 2022-07-07 NOTE — Assessment & Plan Note (Signed)
Newly diagnosed myelodysplasia-excess blasts.  She has associated anemia requiring regular transfusion.  Her hemoglobin has drifted down from 9 to 8.2, so I will arrange for her to have 1 unit of packed red blood cells as an outpatient tomorrow.  I discussed with treatment of her anemia with epoetin injections, but the patient and her sister are uncertain due to the increased risk of blood clots.  I also discussed treatment of her myelodysplasia with azacitidine, as well as potential side effects.  She is not keen on taking any therapy.  I advised her of the risk of the myelodysplasia progressing to acute anemia.  I gave her written education regarding myelodysplasia, as well as azacitidine.  We will plan to see her back in 1 week for repeat clinical assessment as previously scheduled.

## 2022-07-07 NOTE — Progress Notes (Signed)
[facility]  [address] [place],  [place]  [address] [phone]  Clinic Day:  07/07/2022  Referring physician: Street, [name], *  ASSESSMENT & PLAN:   Assessment & Plan: Myelodysplasia (myelodysplastic syndrome) ([place]) Newly diagnosed myelodysplasia-excess blasts.  She has associated anemia requiring regular transfusion.  Her hemoglobin has drifted down from 9 to 8.2, so I will arrange for her to have 1 unit of packed red blood cells as an outpatient tomorrow.  I discussed with treatment of her anemia with epoetin injections, but the patient and her sister are uncertain due to the increased risk of blood clots.  I also discussed treatment of her myelodysplasia with azacitidine, as well as potential side effects.  She is not keen on taking any therapy.  I advised her of the risk of the myelodysplasia progressing to acute anemia.  I gave her written education regarding myelodysplasia, as well as azacitidine.  We will plan to see her back in 1 week for repeat clinical assessment as previously scheduled.  Malignant neoplasm metastatic to brain [facility]) Solitary right cerebellar metastasis, status post preoperative stereotactic radiosurgery followed by surgical resection.   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  She continues to follow with Dr. Mickeal Skinner of Neuro-Oncology.  MRI on August 17 revealed stable post-surgical and post-treatment appearance of the right cerebellar hemisphere. No new intracranial metastatic lesion is identified.  Stable white matter changes consistent with chronic microvascular ischemia seen.  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  She had progression of her disease in August 2020. As her tumor was MSI-high, she was placed on palliative  pembrolizumab. Unfortunately, she had progressive disease after 6 cycles of pembrolizumab.  She was therefore placed on palliative carboplatin/paclitaxel/bevacizumb. Bevacizumab was discontinued due to persistent vaginal bleeding/hematuria and epistaxis.  She completed 6 cycles of carboplatin/paclitaxel in May 2020. CT imaging April 2023 remained stable.  There was no definite evidence of recurrence on CT imaging on August 4.  Stable sub-5 mm pulmonary nodules in the right lung were identified.  Endometrial adenocarcinoma ([place]) History of stage IB high-grade endometrial carcinoma treated with hysterectomy/bilateral salpingo-oophorectomy, followed by adjuvant radiation and brachytherapy in 2018.   The patient understands the plans discussed today and is in agreement with them.  She knows to contact our office if she develops concerns prior to her next appointment.   I provided 30 minutes of face-to-face time during this encounter and > 50% was spent counseling as documented under my assessment and plan.    [name], PA-C  [facility] [address] [place] [address] Dept: [phone] Dept Fax: [phone]   Orders Placed This Encounter  Procedures   CBC and differential    This external order was created through the Results Console.   CBC    This external order was created through the Results Console.   Basic metabolic panel    This external order was created through the Results Console.   Comprehensive metabolic panel    This external order was created through the Results Console.   Hepatic function panel    This external order was created through the Results Console.      CHIEF COMPLAINT:  CC: Myelodysplasia with excess blasts  Current Treatment: Supportive care  HISTORY OF PRESENT ILLNESS:  Brianna Herring  Capshaw is a 66 y.o. female with recurrent endometrial carcinoma with lung metastasis.  She was  originally diagnosed with stage IB (T1b N0 M0) high-grade endometrial carcinoma in late 2017. She had a robotic hysterectomy and bilateral salpingo-oophorectomy in January 2018.  Pathology revealed a 4 cm, grade 3, endometrioid adenocarcinoma with over 50% invasion of the myometrium and lymphovascular invasion.  Six lymph nodes were negative for metastasis.  Margins were clear.  She received adjuvant external beam radiation, completed in April 2018, followed by vaginal brachytherapy to the vaginal cuff.   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  MSI was high.  A referral to [facility] was ordered in 2018, but the patient never followed through.   CT chest, abdomen and pelvis prior to surgery revealed subtle low-attenuation in the central uterus, as well as small bibasilar pulmonary nodules, which were suspicious for metastatic disease, but only up to 6 mm in size.  PET scan in February 2018 did not reveal hypermetabolic activity in the lung nodules or in the left inguinal node or portacaval node.  There was mild uptake of the pelvic sidewall, which was felt to represent postoperative changes.   The patient was seen in routine follow-up in March 2019.  Repeat CT chest revealed new pulmonary nodules measuring up to 1.8 cm in the right middle lobe and not in the area of the prior tiny nodules, with a 1.5 cm left lower lobe cavitary nodule.  The previously seen changes of the pelvic sidewall on the prior PET scan had resolved.  PET in May revealed hypermetabolic activity within the pulmonary nodules consistent with metastatic disease.  There was mild hypermetabolic activity within the portacaval node, which was still felt to be reactive.  We recommended 6 cycles of carboplatin and paclitaxel, but when she presented for chemotherapy education on May 29, her sister requested a referral to a pulmonologist for a definitive tissue diagnosis.  Dr. Melvyn Novas at [facility]  pulmonology went over all the risks and benefits of having a bronchoscopy.  The patient decided it was not worth the risk to undergo biopsy and decided to proceed with chemotherapy.  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  We did obtain MLH1 methylation analysis and there was hypermethylation of MLH1, so this does not likely represent Lynch syndrome.    She received palliative carboplatin/paclitaxel from June to September 2019, completing 6 cycles.  Repeat CT imaging after 3 cycles of carboplatin/paclitaxel revealed a good response with a decrease in the size and number of pulmonary nodules.  She had chronic hematuria and saw Dr. Nila Nephew, and cystoscopy revealed findings of radiation cystitis.  She has also had recurrent urinary tract infections, so was placed on D-mannose for prevention.  CT chest, abdomen and pelvis in October 2019 revealed stable to slightly decreased metastatic lung lesions with no other sites of metastatic disease.  Chemotherapy was discontinued and she was placed on tamoxifen 20 mg daily for maintenance.  Repeat CT scans in January 2020 revealed scattered small bilateral pulmonary nodules that were overall grossly unchanged, including a 10 mm irregular nodule of the right upper lobe and two 6 mm nodules of the right lower lobe.  CT imaging in April was fairly stable with just a slight increase in the right middle lobe pulmonary nodule, which was difficult to assess due to motion artifact.  Unfortunately,  CT imaging in August revealed considerable enlargement of the right middle lobe pulmonary nodule from 1 cm to 2.3 cm consistent with progressive pulmonary metastasis.  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  Foundation  One CDx testing reveals MSI high with an increased TMB (tumor mutational burden) making the patient eligible for immunotherapy. Tamoxifen was discontinued.  She was placed on palliative pembrolizumab every 3 weeks in October 2020.  She presented to the emergency department with shortness of breath in November 2020.  CT angiogram chest revealed small filling defects in the right lower lobe and probably right upper lobe pulmonary arteries consistent with small pulmonary emboli. The metastatic lesion within the right middle lobe was stable at 3 cm.  There was scattered subsegmental atelectasis.  The patient was hospitalized at that time, as she presented with hypoxia.  She initially was given enoxaparin and then transitioned to rivaroxaban.     After completion of 6 cycles of pembrolizumab, she had progressive disease within the chest.  CT chest, abdomen, and pelvis in February 2021 revealed a dramatic increase in the size of the dominant right middle lobe pulmonary nodule, now measuring 5.1 x 5.1 cm, previously measuring 2.4 x 2.3 cm.  The smaller pulmonary nodules in the right lower lobe were stable, but one new pulmonary nodule was seen within the central right middle lobe measuring 14 mm.  There was no evidence of local recurrence of endometrial carcinoma in the pelvis or evidence of metastatic disease in the abdomen or pelvis.  The bladder, kidneys and ureters appeared normal.  In light of these results, her treatment was changed to carboplatin/paclitaxel/bevacizumab beginning in February. She presented to the emergency department with vaginal bleeding in March.  This was felt to be hematuria due to urinary tract infection, and she was found to have Klebsiella pneumoniae in the urine and was discharged on [place].  She had anemia felt to be secondary to blood loss and her hemoglobin dropped to 8.8, but was back up to 9.5 at the time of discharge. CT chest angiogram did not reveal any evidence of pulmonary  embolism.  There was an interval decrease in the pulmonary nodules.  The rivaroxaban was resumed on discharge.  Bevacizumab was permanently discontinued due to continued bleeding.  She saw Dr. Denman George for a vaginal exam in some time.  No vaginal mass or source of bleeding was identified on vaginal examination.  She had recurrent bleeding, so Dr. Venetia Maxon had held her rivaroxaban.  She was found to have another urinary tract infection, this time with Staphylococcal hemolyticus, which was treated with nitrofurantoin.     CT chest, abdomen and pelvis  in June of 2021 revealed an excellent response to treatment.  The large right middle lobe mass had resolved, and the right lower lobe pulmonary nodules remain stable at 5 mm.  There was no evidence of metastatic disease in the abdomen or pelvis.  She had right calf tenderness, redness and warmth and was seen by Dr. Venetia Maxon.  Ultrasound was negative for DVT.  He felt this represented cellulitis, so treated her with cephalexin 500 mg.  CT chest in September 2021 revealed stable small pulmonary nodules without evidence of metastatic disease within the abdomen and pelvis.  She was doing well at the time, so was scheduled for 3 month follow up with repeat imaging.  The patient presented in November  2021 with headache, nausea and vomiting, as well as dizziness and weight loss.  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 Pathology confirmed metastatic endometrial adenocarcinoma.  She did well postop and was tapered off dexamethasone. She continues to follow with Dr. Mickeal Skinner and MRI brain from September 2022 revealed stable post-surgical and post-treatment appearance of the right cerebellar hemisphere. No new intracranial metastatic lesion is identified. CT chest, abdomen and pelvis 2022 revealed no new evidence of metastatic disease in the chest, abdomen or pelvis, with  stable 5 mm right lower lobe pulmonary nodules.  There is a right thyroid nodule stable with respect imaged portions less than 1.5 cm on previous imaging, and mild hepatic steatosis.  MRI of the brain in December with Dr. Mickeal Skinner remains stable.  CT imaging in April did not reveal any evidence of recurrent disease.  There were stable sub-5 mm bilateral pulmonary nodules.  There was mild hepatomegaly and hepatic steatosis.  She recently presented with severe anemia requiring transfusion.  Evaluation did not reveal a specific etiology.  CT chest, abdomen and pelvis in the emergency room on August 4 revealed a trace pericardial effusion, otherwise stable examination without evidence of recurrent or progressive metastasis.  She underwent bone marrow evaluation which revealed myelodysplasia with excess blasts.  She has not made a decision regarding epoetin injections or treatment with azacitidine.  INTERVAL HISTORY:  Ayshia is here today for repeat clinical assessment and states she remains fatigued.  She denies significant shortness of breath.  She is still very inactive but has been walking with a walker for several days.  She requires assistance with her normal ADLs.  She is staying with her sister.  She has not cooking or cleaning.  She denies any neurologic symptoms.  She denies easy bruising or abnormal bleeding.  She denies fevers or chills. She denies pain. Her appetite is good. Her weight has decreased 5 pounds over last 5 days .  She and her sister are still unsure about epoetin injections due to the risk of blood clots given.  She has a history of pulmonary embolism.  Her sister said the patient previously states she would not wish to have any further chemotherapy.  REVIEW OF SYSTEMS:  Review of Systems  Constitutional:  Negative for appetite change, chills, fatigue, fever and unexpected weight change.  HENT:   Negative for lump/mass, mouth sores and sore throat.   Respiratory:  Negative for cough and  shortness of breath.   Cardiovascular:  Negative for chest pain and leg swelling.  Gastrointestinal:  Negative for abdominal pain, constipation, diarrhea, nausea and vomiting.  Endocrine: Negative for hot flashes.  Genitourinary:  Negative for difficulty urinating, dysuria, frequency and hematuria.   Musculoskeletal:  Negative for arthralgias, back pain and myalgias.  Skin:  Negative for rash.  Neurological:  Negative for dizziness and headaches.  Hematological:  Negative for adenopathy. Does not bruise/bleed easily.  Psychiatric/Behavioral:  Negative for depression and sleep disturbance. The patient is not nervous/anxious.      VITALS:  Blood pressure 108/73, pulse (!) 106, temperature 99.2 F (37.3 C), temperature source Tympanic, resp. rate 20, height '5\' 6"'  (1.676 m), weight 188 lb (85.3 kg), SpO2 98 %.  Wt Readings from Last 3 Encounters:  07/07/22 188 lb (85.3 kg)  07/02/22 193 lb (87.5 kg)  06/30/22 193 lb 9.6 oz (87.8 kg)    Body mass index is 30.34 kg/m.  Performance status (ECOG): 2 - Symptomatic, <50% confined to bed  PHYSICAL EXAM:  Physical Exam Vitals and nursing note reviewed.  Constitutional:      General: She is not in acute distress.    Appearance: Normal appearance.  HENT:     Head: Normocephalic and atraumatic.     Mouth/Throat:     Mouth: Mucous membranes are moist.     Pharynx: Oropharynx is clear. No oropharyngeal exudate or posterior oropharyngeal erythema.  Eyes:     General: No scleral icterus.    Extraocular Movements: Extraocular movements intact.     Conjunctiva/sclera: Conjunctivae normal.     Pupils: Pupils are equal, round, and reactive to light.  Cardiovascular:     Rate and Rhythm: Normal rate and regular rhythm.     Heart sounds: Normal heart sounds. No murmur heard.    No friction rub. No gallop.  Pulmonary:     Effort: Pulmonary effort is normal.     Breath sounds: Normal breath sounds. No wheezing, rhonchi or rales.  Abdominal:      General: There is no distension.     Palpations: Abdomen is soft. There is no hepatomegaly, splenomegaly or mass.     Tenderness: There is no abdominal tenderness.  Musculoskeletal:        General: Normal range of motion.     Cervical back: Normal range of motion and neck supple. No tenderness.     Right lower leg: No edema.     Left lower leg: No edema.  Lymphadenopathy:     Cervical: No cervical adenopathy.     Upper Body:     Right upper body: No supraclavicular or axillary adenopathy.     Left upper body: No supraclavicular or axillary adenopathy.  Skin:    General: Skin is warm and dry.     Coloration: Skin is not jaundiced.     Findings: No rash.  Neurological:     Mental Status: She is alert and oriented to person, place, and time.     Cranial Nerves: No cranial nerve deficit.  Psychiatric:        Mood and Affect: Mood normal.        Behavior: Behavior normal.        Thought Content: Thought content normal.     LABS:      Latest Ref Rng & Units 07/07/2022   12:00 AM 06/30/2022   12:00 AM 06/23/2022   12:00 AM  CBC  WBC  4.9     7.3     5.3      Hemoglobin 12.0 - 16.0 8.2     9.0     8.7      Hematocrit 36 - 46 '25     28     26      ' Platelets 150 - 400 K/uL 113     145     161         This result is from an external source.      Latest Ref Rng & Units 07/07/2022   12:00 AM 06/30/2022   12:00 AM 06/23/2022   12:00 AM  CMP  BUN 4 - '21 9     10     10      ' Creatinine 0.5 - 1.1 0.5     0.5     0.6      Sodium 137 - 147 135     133     134  Potassium 3.5 - 5.1 mEq/L 3.8     4.0     4.2      Chloride 99 - 108 98     99     99      CO2 13 - '22 28     26     27      ' Calcium 8.7 - 10.7 9.3     9.3     8.8      Alkaline Phos 25 - 125 94     142     113      AST 13 - 35 34     42     41      ALT 7 - 35 U/L 30     44     29         This result is from an external source.     No results found for: "CEA1", "CEA" / No results found for: "CEA1", "CEA" No results  found for: "PSA1" No results found for: "CAN199" Lab Results  Component Value Date   CAN125 7.2 12/10/2021    Lab Results  Component Value Date   TOTALPROTELP 7.8 06/14/2022   ALBUMINELP 2.5 (L) 06/14/2022   A1GS 0.5 (H) 06/14/2022   A2GS 1.3 (H) 06/14/2022   BETS 1.0 06/14/2022   GAMS 2.4 (H) 06/14/2022   MSPIKE Not Observed 06/14/2022   SPEI Comment 06/14/2022   Lab Results  Component Value Date   TIBC 227 (L) 06/10/2022   FERRITIN 762 (H) 06/10/2022   IRONPCTSAT 12 06/10/2022   Lab Results  Component Value Date   LDH 192 06/09/2022    STUDIES:  MR Brain W Wo Contrast  Result Date: 06/24/2022 CLINICAL DATA:  Follow-up brain metastasis. EXAM: MRI HEAD WITHOUT AND WITH CONTRAST TECHNIQUE: Multiplanar, multiecho pulse sequences of the brain and surrounding structures were obtained without and with intravenous contrast. CONTRAST:  60m GADAVIST GADOBUTROL 1 MMOL/ML IV SOLN COMPARISON:  MRI head 03/30/2022 FINDINGS: Brain: Right suboccipital craniotomy for tumor resection in the cerebellum. Mild enhancement in the resection site is stable. Mild adjacent FLAIR hyperintensity is stable. Mild chronic blood products at the resection site stable. No recurrent tumor. No other enhancing lesions identified. Hyperintensities in the white matter bilaterally are stable. No acute infarct. Negative for hydrocephalus. Vascular: Normal arterial flow voids. Skull and upper cervical spine: Bone marrow is diffusely low signal on T1 which has progressed. This may be treatment related. No focal bone lesion. Sinuses/Orbits: Paranasal sinuses clear.  Negative orbit Other: None IMPRESSION: Stable postsurgical changes right cerebellum without recurrent local tumor. No other areas of metastatic disease Stable white matter changes consistent with chronic microvascular ischemia. Electronically Signed   By: CFranchot GalloM.D.   On: 06/24/2022 14:36   DG Chest 2 View  Result Date: 06/19/2022 CLINICAL DATA:   Suspected sepsis EXAM: CHEST - 2 VIEW COMPARISON:  Chest x-ray 06/16/2022 FINDINGS: Right chest port catheter tip projects over the SVC, unchanged. The heart size and mediastinal contours are within normal limits. Both lungs are clear. The visualized skeletal structures are unremarkable. IMPRESSION: No active cardiopulmonary disease. Electronically Signed   By: ARonney AstersM.D.   On: 06/19/2022 16:59      HISTORY:   Past Medical History:  Diagnosis Date   Acquired thrombophilia ([place]    Anemia of chronic disease    Aortic atherosclerosis (HCC)    Atherosclerosis of native coronary artery of native heart with angina pectoris ([place]  BMI 34.0-34.9,adult    Body mass index (BMI) 35.0-35.9, adult 09/29/2020   Cancer [facility])    endometrial   Cancer, metastatic to lung [facility]    Endometrial adenocarcinoma ([place]) 11/03/2016   Endometrioid adenocarcinoma of uterus ([place])    History of radiation therapy 03/02/17-03/16/17   vaginal cuff treated to 18 Gy with 3 fractions of 6 Gy   Hyperglycemia    Labile blood pressure    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[place]) 09/19/2020   Morbid obesity ([place])    Multiple lung nodules on CT 03/14/2018   CT 11/15/16  MPN's largest 6 mm - PET 12/15/16 neg but largest < 71m - CT chest 01/31/18  Largest ?RML proximal/central  @ 1.8 x 1.5 and LLL peripheral cavitary x  1.5 x1.4  - PET 03/22/18 :  Two distinct nodules, def growing since 01/31/18 both hypermetabolic    Phlebitis alone left leg  4-5 yrs ago   Pulmonary embolism on right (HCC)    Redness and swelling of lower leg    Secondary malignant neoplasm of lung ([place] 07/18/2019   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) ([place] 01/18/2018    Past Surgical History:  Procedure Laterality Date   APPLICATION OF CRANIAL NAVIGATION N/A 10/08/2020   Procedure: APPLICATION OF CRANIAL NAVIGATION;  Surgeon: TVallarie Mare MD;  [facility]  Service: Neurosurgery;  Laterality: N/A;    CRANIOTOMY N/A 10/08/2020   Procedure: CRANIOTOMY - Suboccipital TUMOR EXCISION - Brain Lab;  Surgeon: TVallarie Mare MD;  [facility]  Service: Neurosurgery;  Laterality: N/A;  suboccipital   ROBOTIC ASSISTED TOTAL HYSTERECTOMY WITH BILATERAL SALPINGO OOPHERECTOMY N/A 11/18/2016   Procedure: XI ROBOTIC ASSISTED TOTAL HYSTERECTOMY WITH BILATERAL SALPINGO OOPHORECTOMY;  Surgeon: EEveritt Amber MD;  Location: [facility];  Service: Gynecology;  Laterality: N/A;   SENTINEL NODE BIOPSY N/A 11/18/2016   Procedure: SENTINEL NODE BIOPSY;  Surgeon: EEveritt Amber MD;  Location: [facility];  Service: Gynecology;  Laterality: N/A;    Family History  Problem Relation Age of Onset   Atrial fibrillation Mother    Lung cancer Father    Breast cancer Sister    Heart failure Sister     Social History:  reports that she has never smoked. She has never used smokeless tobacco. She reports that she does not drink alcohol and does not use drugs.The patient is accompanied by her sister today.  Allergies: No Known Allergies  Current Medications: Current Outpatient Medications  Medication Sig Dispense Refill   acetaminophen (TYLENOL) 500 MG tablet Take 500 mg by mouth every 6 (six) hours as needed for pain or headache.     Calcium Carbonate (CALTRATE 600 PO) Take 1 tablet by mouth in the morning and at bedtime.     HYDROcodone-acetaminophen (NORCO/VICODIN) 5-325 MG tablet Take 1 tablet by mouth every 4 (four) hours as needed.     metoprolol succinate (TOPROL-XL) 25 MG 24 hr tablet Take 1 tablet (25 mg total) by mouth 2 (two) times daily. 180 tablet 3   Multiple Vitamin (MULTIVITAMIN WITH MINERALS) TABS tablet Take 1 tablet by mouth daily. Centrum Silver     ondansetron (ZOFRAN) 4 MG tablet Take 1 tablet (4 mg total) by mouth every 4 (four) hours as needed for nausea. 90 tablet 3   potassium chloride SA (KLOR-CON M) 20 MEQ tablet Take 40 mEq by mouth 2 (two) times daily. Takes 5 tablets daily while taking Torsemide      Probiotic Product (PROBIOTIC PO) Take  1 capsule by mouth daily.     prochlorperazine (COMPAZINE) 10 MG tablet Take 1 tablet (10 mg total) by mouth every 6 (six) hours as needed for nausea or vomiting. 90 tablet 3   No current facility-administered medications for this visit.

## 2022-07-07 NOTE — Assessment & Plan Note (Addendum)
History of stage IB high-grade endometrial carcinoma treated with hysterectomy/bilateral salpingo-oophorectomy, followed by adjuvant radiation and brachytherapy in 2018.

## 2022-07-07 NOTE — Assessment & Plan Note (Signed)
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 She had progression of her disease in August 2020. As her tumor was MSI-high, she was placed on palliative pembrolizumab. Unfortunately, she had progressive disease after 6 cycles of pembrolizumab. She was therefore placed on palliative carboplatin/paclitaxel/bevacizumb. Bevacizumab was discontinued due to persistent vaginal bleeding/hematuria and epistaxis. She completed 6 cycles of carboplatin/paclitaxel in May 2020. CT imaging April 2023 remained stable.  There was no definite evidence of recurrence on CT imaging on August 4.  Stable sub-5 mm pulmonary nodules in the right lung were identified. 

## 2022-07-08 ENCOUNTER — Other Ambulatory Visit: Payer: Self-pay

## 2022-07-08 ENCOUNTER — Inpatient Hospital Stay: Payer: Medicare Other

## 2022-07-08 DIAGNOSIS — D469 Myelodysplastic syndrome, unspecified: Secondary | ICD-10-CM

## 2022-07-08 DIAGNOSIS — D4621 Refractory anemia with excess of blasts 1: Secondary | ICD-10-CM | POA: Diagnosis not present

## 2022-07-08 MED ORDER — ACETAMINOPHEN 325 MG PO TABS
ORAL_TABLET | ORAL | Status: AC
  Filled 2022-07-08: qty 2

## 2022-07-08 MED ORDER — ACETAMINOPHEN 325 MG PO TABS
650.0000 mg | ORAL_TABLET | Freq: Once | ORAL | Status: AC
  Administered 2022-07-08: 650 mg via ORAL

## 2022-07-08 MED ORDER — SODIUM CHLORIDE 0.9% IV SOLUTION
250.0000 mL | Freq: Once | INTRAVENOUS | Status: AC
  Administered 2022-07-08: 250 mL via INTRAVENOUS

## 2022-07-08 MED ORDER — DIPHENHYDRAMINE HCL 25 MG PO CAPS
25.0000 mg | ORAL_CAPSULE | Freq: Once | ORAL | Status: AC
  Administered 2022-07-08: 25 mg via ORAL

## 2022-07-08 NOTE — Progress Notes (Signed)
[facility]  [address] 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 female with recurrent endometrial carcinoma with lung metastasis.  She was originally diagnosed with stage IB (T1b N0 M0) high-grade endometrial carcinoma in late 2017. She had a robotic hysterectomy and bilateral salpingo-oophorectomy in January 2018.  Pathology revealed a 4 cm, grade 3, endometrioid adenocarcinoma with over 50% invasion of the myometrium and lymphovascular invasion.  Six lymph nodes were negative for metastasis.  Margins were clear.  She received adjuvant external beam radiation, completed in April 2018, followed by vaginal brachytherapy to the vaginal cuff.   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  MSI was high.  A referral to [facility] was ordered in 2018, but the patient never followed through.   CT chest, abdomen and pelvis prior to surgery revealed subtle low-attenuation in the central uterus, as well as small bibasilar pulmonary nodules, which were suspicious for metastatic disease, but only up to 6 mm in size.  PET scan in February 2018 did not reveal hypermetabolic activity in the lung nodules or in the left inguinal node or portacaval node.  There was mild uptake of the pelvic sidewall, which was felt to represent postoperative changes.   The patient was seen in routine follow-up in March 2019.  Repeat CT chest revealed new pulmonary nodules measuring up to 1.8 cm in the right middle lobe and not in the area of the prior tiny nodules, with a 1.5 cm left lower lobe cavitary nodule.  The previously seen changes of the pelvic sidewall on the prior PET scan had  resolved.  PET in May revealed hypermetabolic activity within the pulmonary nodules consistent with metastatic disease.  There was mild hypermetabolic activity within the portacaval node, which was still felt to be reactive.  We recommended 6 cycles of carboplatin and paclitaxel, but when she presented for chemotherapy education on May 29, her sister requested a referral to a pulmonologist for a definitive tissue diagnosis.  Dr. Melvyn Novas at [facility] pulmonology went over all the risks and benefits of having a bronchoscopy.  The patient decided it was not worth the risk to undergo biopsy and decided to proceed with chemotherapy.  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  We did obtain MLH1 methylation analysis and there was hypermethylation of MLH1, so this does not likely represent Lynch syndrome.    She received palliative carboplatin/paclitaxel from June to September 2019, completing 6 cycles.  Repeat CT imaging after 3 cycles of carboplatin/paclitaxel revealed a good response with a decrease in the size and number of pulmonary nodules.  She had chronic hematuria and saw Dr. Nila Nephew, and cystoscopy revealed findings of radiation cystitis.  She has also had recurrent urinary tract infections, so was placed on D-mannose for prevention.  CT chest, abdomen and pelvis in October 2019 revealed stable to slightly decreased metastatic lung lesions with no other sites of metastatic disease.  Chemotherapy was discontinued and she was placed on tamoxifen 20 mg daily for maintenance.  Repeat CT scans in  January 2020 revealed scattered small bilateral pulmonary nodules that were overall grossly unchanged, including a 10 mm irregular nodule of the right upper lobe and two 6 mm nodules of the right lower lobe.  CT imaging in April was fairly stable with just a slight increase in the right middle lobe pulmonary nodule,  which was difficult to assess due to motion artifact.  Unfortunately, CT imaging in August revealed considerable enlargement of the right middle lobe pulmonary nodule from 1 cm to 2.3 cm consistent with progressive pulmonary metastasis.  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  [facility] CDx testing reveals MSI high with an increased TMB (tumor mutational burden) making the patient eligible for immunotherapy. Tamoxifen was discontinued.  She was placed on palliative pembrolizumab every 3 weeks in October 2020.  She presented to the emergency department with shortness of breath in November 2020.  CT angiogram chest revealed small filling defects in the right lower lobe and probably right upper lobe pulmonary arteries consistent with small pulmonary emboli. The metastatic lesion within the right middle lobe was stable at 3 cm.  There was scattered subsegmental atelectasis.  The patient was hospitalized at that time, as she presented with hypoxia.  She initially was given enoxaparin and then transitioned to rivaroxaban.     After completion of 6 cycles of pembrolizumab, she had progressive disease within the chest.  CT chest, abdomen, and pelvis in February 2021 revealed a dramatic increase in the size of the dominant right middle lobe pulmonary nodule, now measuring 5.1 x 5.1 cm, previously measuring 2.4 x 2.3 cm.  The smaller pulmonary nodules in the right lower lobe were stable, but one new pulmonary nodule was seen within the central right middle lobe measuring 14 mm.  There was no evidence of local recurrence of endometrial carcinoma in the pelvis or evidence of metastatic disease in the abdomen or pelvis.  The bladder, kidneys and ureters appeared normal.  In light of these results, her treatment was changed to carboplatin/paclitaxel/bevacizumab  beginning in February. She presented to the emergency department with vaginal bleeding in March.  This was felt to be hematuria due to urinary tract infection, and she was found to have Klebsiella pneumoniae in the urine and was discharged on [place].  She had anemia felt to be secondary to blood loss and her hemoglobin dropped to 8.8, but was back up to 9.5 at the time of discharge. CT chest angiogram did not reveal any evidence of pulmonary embolism.  There was an interval decrease in the pulmonary nodules.  The rivaroxaban was resumed on discharge.  Bevacizumab was permanently discontinued due to continued bleeding.  She saw Dr. Denman George for a vaginal exam in some time.  No vaginal mass or source of bleeding was identified on vaginal examination.  She had recurrent bleeding, so Dr. Venetia Maxon had held her rivaroxaban.  She was found to have another urinary tract infection, this time with Staphylococcal hemolyticus, which was treated with nitrofurantoin.     CT chest, abdomen and pelvis  in June of 2021 revealed an excellent response to treatment.  The large right middle lobe mass had resolved, and the right lower lobe pulmonary nodules remain stable at 5 mm.  There was no evidence of metastatic disease in the abdomen or pelvis.  She had had right calf tenderness, redness and warmth and was seen by by  Dr. Venetia Maxon.  Ultrasound was negative for DVT.  He felt this represented cellulitis, so treated her with cephalexin 500 mg. CT chest in September 2021 revealed stable small pulmonary nodules without evidence of metastatic disease within the abdomen and pelvis.  She was doing well at the time, so was scheduled for 3 month follow up with repeat imaging.  The patient presented in November 2021 with headache, nausea and vomiting, as well as dizziness and weight loss.  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  Pathology confirmed metastatic endometrial adenocarcinoma.  She did well postop and was tapered off dexamethasone. She continues to follow with Dr. Mickeal Skinner and MRI brain from September 2022 revealed stable post-surgical and post-treatment appearance of the right cerebellar hemisphere. No new intracranial metastatic lesion is identified. CT chest, abdomen and pelvis from October 28th,2022 revealed no new evidence of metastatic disease in the chest, abdomen or pelvis, with stable 5 mm right lower lobe pulmonary nodules.  There is a right thyroid nodule stable with respect imaged portions less than 1.5 cm on previous imaging, and mild hepatic steatosis.  MRI of the brain in December with Dr. Mickeal Skinner remains stable.  Her last CA  125 in February remains normal.   INTERVAL HISTORY:  Aliceson is here for routine follow up and she feels fairly poorly.  She went to the emergency room 06/16/2022. She reports of coughing up phlegm and does complain of sore throat. She has had severe anemia and is requiring transfusions. Stool hemoccults were negative in the emergency room but she does report blood in the stool . We are trying to get her in with a Gastroenterologist but there is a delay in getting an appointment.She reports of having a fever and her sister reports its been on and off and occasionally spikes for 2-3 weeks. We know she had a UTI with klebsiella and is on cefdinir for this but continues to have fevers. I am concerned that she has acute bronchitis as well and we need to broaden her antibiotic coverage. I will add Zithromax 500 mg daily for one week. She is set to get two units of packed red blood cells on 06/18/2022. Her  appetite is good, but she has lost 12 pounds since June.      Wt Readings from Last 3 Encounters:  07/07/22 188 lb (85.3 kg)  07/02/22 193 lb (87.5 kg)  06/30/22 193 lb 9.6 oz (87.8 kg)  Her hemoglobin is at 7.7, otherwise blood counts and chemistries are normal. We will start her on Zithromax  500 mg. She now has a severe left shift in her white blood cells with presence of promyelocytes and possible blasts. I am concerned about the possibility of chemotherapy induced myelodysplasia or leukemia. I have recommended she get a bone marrow test next week 06/23/2022. We will see her one week later to review the results. She denies nausea, vomiting, bowel issues, or abdominal pain.  She denies sore throat, cough, dyspnea, or chest pain.   REVIEW OF SYSTEMS:  Review of Systems  Constitutional: Negative.  Negative for appetite change, chills, fatigue, fever and unexpected weight change.  HENT:  Negative.    Eyes: Negative.   Respiratory: Negative.  Negative for chest tightness, cough, hemoptysis, shortness of breath and wheezing.   Cardiovascular: Negative.  Negative for chest pain, leg swelling and palpitations.  Gastrointestinal: Negative.  Negative for abdominal distention, abdominal pain, blood  in stool, constipation, diarrhea, nausea and vomiting.  Endocrine: Negative.   Genitourinary: Negative.  Negative for difficulty urinating, dysuria, frequency and hematuria.   Musculoskeletal: Negative.  Negative for arthralgias, back pain, flank pain, gait problem and myalgias.  Skin: Negative.   Neurological:  Negative for extremity weakness, gait problem, headaches, light-headedness, numbness, seizures and speech difficulty.  Hematological: Negative.   Psychiatric/Behavioral: Negative.  Negative for depression and sleep disturbance. The patient is not nervous/anxious.       VITALS:  Blood pressure 130/67, pulse (!) 122, temperature 99.7 F (37.6 C), temperature source Oral, resp. rate (!) 22, height '5\' 6"'  (1.676 m), weight 202 lb 14.4 oz (92 kg), SpO2 98 %.     Wt Readings from Last 3 Encounters:  07/07/22 188 lb (85.3 kg)  07/02/22 193 lb (87.5 kg)  06/30/22 193 lb 9.6 oz (87.8 kg)    Body mass index is 32.75 kg/m.   Performance status (ECOG): 0 - Asymptomatic   PHYSICAL EXAM:   Physical Exam Constitutional:      General: She is not in acute distress.    Appearance: Normal appearance. She is normal weight.  HENT:     Head: Normocephalic and atraumatic.  Eyes:     General: No scleral icterus.    Extraocular Movements: Extraocular movements intact.     Conjunctiva/sclera: Conjunctivae normal.     Pupils: Pupils are equal, round, and reactive to light.  Cardiovascular:     Rate and Rhythm: Regular rhythm. Tachycardia present.     Pulses: Normal pulses.     Heart sounds: Normal heart sounds. No murmur heard.    No friction rub. No gallop.  Pulmonary:     Effort: Pulmonary effort is normal. No respiratory distress.     Breath sounds: Normal breath sounds.  Abdominal:     General: Bowel sounds are normal. There is no distension.     Palpations: Abdomen is soft. There is no hepatomegaly, splenomegaly or mass.     Tenderness: There is no abdominal tenderness.  Musculoskeletal:        General: Normal range of motion.     Cervical back: Normal range of motion and neck supple.     Right lower leg: No edema.     Left lower leg: No edema.  Lymphadenopathy:     Cervical: No cervical adenopathy.  Skin:    General: Skin is warm and dry.  Neurological:     General: No focal deficit present.     Mental Status: She is alert and oriented to person, place, and time. Mental status is at baseline.  Psychiatric:        Mood and Affect: Mood normal.        Behavior: Behavior normal.        Thought Content: Thought content normal.        Judgment: Judgment normal.      LABS:        Latest Ref Rng & Units 07/07/2022   12:00 AM 06/30/2022   12:00 AM 06/23/2022   12:00 AM  CBC  WBC   4.9     7.3     5.3      Hemoglobin 12.0 - 16.0 8.2     9.0     8.7      Hematocrit 36 - 46 '25     28     26      ' Platelets 150 - 400 K/uL 113     145  161         This result is from an external source.        Latest Ref Rng & Units 07/07/2022   12:00 AM 06/30/2022   12:00 AM  06/23/2022   12:00 AM  CMP  BUN 4 - '21 9     10     10      ' Creatinine 0.5 - 1.1 0.5     0.5     0.6      Sodium 137 - 147 135     133     134      Potassium 3.5 - 5.1 mEq/L 3.8     4.0     4.2      Chloride 99 - 108 98     99     99      CO2 13 - '22 28     26     27      ' Calcium 8.7 - 10.7 9.3     9.3     8.8      Alkaline Phos 25 - 125 94     142     113      AST 13 - 35 34     42     41      ALT 7 - 35 U/L 30     44     29         This result is from an external source.      Recent Labs       Lab Results  Component Value Date    ZOX096 7.2 12/10/2021          STUDIES:  EXAM 06/16/22 CT ANGIOGRAPHY CHEST WITH CONTRAST IMPRESSION No pulmonary embolus Interval increase in size of trace to small volume pericardial effusion. Interval development of trace bilateral, right greater than left, pleural effusion. Couple pulmonary micronodules. No follow-up needed if patient is low risk (and has no known or suspected primary neoplasm). Non-contrast chest CT can  be considered in 12 months if patient is high-risk. This recommendation follows the consensus statement: Guidelines for Management of Incidental Pulmonary Nodules Detected on CT images: From the Fleischner Society 2017; Radiology 2017; [id number]   EXAM:06/11/22 CT CHEST, ABDOMEN, AND PELVIS WITH CONTRAST IMPRESSION: Trace pericardial effusion, new since prior study. Otherwise stable examination, without evidence of recurrent or progressive metastatic disease. Stbale sub 5 mm pulmonary nodules. No acute intra-abdominal or intrapelvic process. Aortic Atherosclerosis (ICD10-170.0)       EXAM:03/30/2022 MRI HEAD WITHOUT AND WITH CONTRAST   FINDINGS: Brain:   Mild generalized parenchymal atrophy.   Sequela of prior right suboccipital craniotomy and right cerebellar mass resection. Unchanged mild non-masslike enhancement, and small amount of chronic blood products, at the resection site.   No new enhancing intracranial  lesions are identified.   Multifocal T2 FLAIR hyperintense signal abnormality within the cerebral white matter, nonspecific but compatible with moderate chronic small vessel ischemic disease.   There is no acute infarct.   No extra-axial fluid collection.   No midline shift.   Vascular: Maintained flow voids within the proximal large arterial vessels.   Skull and upper cervical spine: Right suboccipital cranioplasty. No focal suspicious marrow lesion. Incompletely assessed cervical spondylosis.   Sinuses/Orbits: No mass or acute finding within the imaged orbits. Tiny mucous retention cyst within the right maxillary sinus.   Other: Trace fluid within the right mastoid air cells.   IMPRESSION: Stable  post-treatment changes within the right cerebellar hemisphere.   No new intracranial metastasis is identified.   Moderate chronic small vessel ischemic disease within the cerebral white matter, stable.     HISTORY:    Allergies: No Known Allergies   Current Medications:       Current Outpatient Medications  Medication Sig Dispense Refill   acetaminophen (TYLENOL) 500 MG tablet Take 500 mg by mouth every 6 (six) hours as needed for pain or headache.       Calcium Carbonate (CALTRATE 600 PO) Take 1 tablet by mouth in the morning and at bedtime.       HYDROcodone-acetaminophen (NORCO/VICODIN) 5-325 MG tablet Take 1 tablet by mouth every 4 (four) hours as needed.       metoprolol succinate (TOPROL-XL) 25 MG 24 hr tablet Take 1 tablet (25 mg total) by mouth 2 (two) times daily. 180 tablet 3   Multiple Vitamin (MULTIVITAMIN WITH MINERALS) TABS tablet Take 1 tablet by mouth daily. Centrum Silver       ondansetron (ZOFRAN) 4 MG tablet Take 1 tablet (4 mg total) by mouth every 4 (four) hours as needed for nausea. 90 tablet 3   potassium chloride SA (KLOR-CON M) 20 MEQ tablet Take 40 mEq by mouth 2 (two) times daily. Takes 5 tablets daily while taking Torsemide       Probiotic  Product (PROBIOTIC PO) Take 1 capsule by mouth daily.       prochlorperazine (COMPAZINE) 10 MG tablet Take 1 tablet (10 mg total) by mouth every 6 (six) hours as needed for nausea or vomiting. 90 tablet 3    No current facility-administered medications for this visit.        ASSESSMENT & PLAN:    Assessment: 1. History of stage IB high-grade endometrial carcinoma treated with hysterectomy/bilateral salpingo-oophorectomy, followed by adjuvant radiation and brachytherapy in 2018.   2. 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  She had progression of her disease in August 2020. As her tumor was MSI-high, she was placed on palliative pembrolizumab. Unfortunately, she had progressive disease after 6 cycles of pembrolizumab.  She was therefore placed on palliative carboplatin/paclitaxel/bevacizumb. Bevacizumab was discontinued due to persistent vaginal bleeding/hematuria and epistaxis.  She completed 6 cycles of carboplatin/paclitaxel in May 2020. CT imaging of April 2023 remains stable.   3. Pulmonary emboli in February 2021.  She is at increased risk for thrombosis due to her malignancy, but due to recurrent bleeding, we discontinued rivaroxaban .  She has not had evidence of recurrent thrombosis.   4. Solitary right cerebellar metastasis, status post preoperative stereotactic radio surgery followed by surgical resection.   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  She continues to follow with Dr. Mickeal Skinner of Neuro-Oncology.  MRI in December 2022 revealed stable post-surgical and post-treatment appearance of the right cerebellar hemisphere. No new intracranial metastatic lesion is identified.   5. Severe anemia, much worse than previous. She has no evidence of hemolytic anemia, nutritional deficiency, or multiple myeloma. I therefore think that she is  losing blood in the GI tract. However the other possibility would be bone marrow dysfunction, but her white and red cells remain normal. We will schedule a bone marrow for next week. Since her iron test equivocal, I will check a transferrin receptor today.   6.  Multiple bilateral pulmonary nodules which are stable and measuring up to 5 mm in diameter.  Plan:   I will schedule a bone marrow next week and recheck her labs at that time. I will bring her back in 2 weeks to review the results. We will continue to try to get her in with a  GI specialist ASAP. She will be transfused with 2 units of red packed blood cells tomorrow.  Since her iron test equivocal, I will check a transferrin receptor today. Then we can decide if she needs IV iron as well.The patient and her sister understand the plans discussed today and are in agreement with them.  She knows to contact our office if she develops concerns prior to her next appointment.   I provided 30 minutes of face-to-face time during this this encounter and > 50% was spent counseling as documented under my assessment and plan.        I,Gabriella Ballesteros,acting as a scribe for Derwood Kaplan, MD.,have documented all relevant documentation on the behalf of Derwood Kaplan, MD,as directed by  Derwood Kaplan, MD while in the presence of Derwood Kaplan, MD.    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 female with recurrent endometrial carcinoma with lung metastasis.  She was originally diagnosed with stage IB (T1b N0 M0) high-grade endometrial carcinoma in late 2017. She had a robotic hysterectomy and  bilateral salpingo-oophorectomy in January 2018.  Pathology revealed a 4 cm, grade 3, endometrioid adenocarcinoma with over 50% invasion of the myometrium and lymphovascular invasion.  Six lymph nodes were negative for metastasis.  Margins were clear.  She received adjuvant external beam radiation, completed in April 2018, followed by vaginal brachytherapy to the vaginal cuff.   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  MSI was high.  A referral to [facility] was ordered in 2018, but the patient never followed through.   CT chest, abdomen and pelvis prior to surgery revealed subtle low-attenuation in the central uterus, as well as small bibasilar pulmonary nodules, which were suspicious for metastatic disease, but only up to 6 mm in size.  PET scan in February 2018 did not reveal hypermetabolic activity in the lung nodules or in the left inguinal node or portacaval node.  There was mild uptake of the pelvic sidewall, which was felt to represent postoperative changes.   The patient was seen in routine follow-up in March 2019.  Repeat CT chest revealed new pulmonary nodules measuring up to 1.8 cm in the right middle lobe and not in the area of the prior tiny nodules, with a 1.5 cm left lower lobe cavitary nodule.  The previously seen changes of the pelvic sidewall on the prior PET scan had resolved.  PET in May revealed hypermetabolic activity within the pulmonary nodules consistent with metastatic disease.  There was mild hypermetabolic activity within the portacaval node, which was still felt to be reactive.  We recommended 6 cycles of carboplatin and paclitaxel, but when she presented for chemotherapy education on May 29, her sister requested a referral to a pulmonologist for a definitive tissue diagnosis.  Dr. Melvyn Novas at [facility] pulmonology went over all the risks  and benefits of having a bronchoscopy.  The patient decided it was not worth the risk to undergo biopsy  and decided to proceed with chemotherapy.  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  We did obtain MLH1 methylation analysis and there was hypermethylation of MLH1, so this does not likely represent Lynch syndrome.    She received palliative carboplatin/paclitaxel from June to September 2019, completing 6 cycles.  Repeat CT imaging after 3 cycles of carboplatin/paclitaxel revealed a good response with a decrease in the size and number of pulmonary nodules.  She had chronic hematuria and saw Dr. Nila Nephew, and cystoscopy revealed findings of radiation cystitis.  She has also had recurrent urinary tract infections, so was placed on D-mannose for prevention.  CT chest, abdomen and pelvis in October 2019 revealed stable to slightly decreased metastatic lung lesions with no other sites of metastatic disease.  Chemotherapy was discontinued and she was placed on tamoxifen 20 mg daily for maintenance.  Repeat CT scans in January 2020 revealed scattered small bilateral pulmonary nodules that were overall grossly unchanged, including a 10 mm irregular nodule of the right upper lobe and two 6 mm nodules of the right lower lobe.  CT imaging in April was fairly stable with just a slight increase in the right middle lobe pulmonary nodule, which was difficult to assess due to motion artifact.  Unfortunately, CT imaging in August revealed considerable enlargement of the right middle lobe pulmonary nodule from 1 cm to 2.3 cm consistent with progressive pulmonary metastasis.  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  [facility] CDx testing reveals MSI high with an increased TMB (tumor mutational burden) making the patient eligible for immunotherapy. Tamoxifen  was discontinued.  She was placed on palliative pembrolizumab every 3 weeks in October 2020.  She presented to the emergency department with shortness of breath in November 2020.  CT angiogram chest revealed small filling defects in the right lower lobe and probably right upper lobe pulmonary arteries consistent with small pulmonary emboli. The metastatic lesion within the right middle lobe was stable at 3 cm.  There was scattered subsegmental atelectasis.  The patient was hospitalized at that time, as she presented with hypoxia.  She initially was given enoxaparin and then transitioned to rivaroxaban.     After completion of 6 cycles of pembrolizumab, she had progressive disease within the chest.  CT chest, abdomen, and pelvis in February 2021 revealed a dramatic increase in the size of the dominant right middle lobe pulmonary nodule, now measuring 5.1 x 5.1 cm, previously measuring 2.4 x 2.3 cm.  The smaller pulmonary nodules in the right lower lobe were stable, but one new pulmonary nodule was seen within the central right middle lobe measuring 14 mm.  There was no evidence of local recurrence of endometrial carcinoma in the pelvis or evidence of metastatic disease in the abdomen or pelvis.  The bladder, kidneys and ureters appeared normal.  In light of these results, her treatment was changed to carboplatin/paclitaxel/bevacizumab beginning in February. She presented to the emergency department with vaginal bleeding in March.  This was felt to be hematuria due to urinary tract infection, and she was found to have Klebsiella pneumoniae in the urine and was discharged on [place].  She had anemia  felt to be secondary to blood loss and her hemoglobin dropped to 8.8, but was back up to 9.5 at the time of discharge. CT chest angiogram did not reveal any evidence of pulmonary embolism.  There was an interval decrease in the pulmonary nodules.  The rivaroxaban was resumed on discharge.  Bevacizumab was permanently  discontinued due to continued bleeding.  She saw Dr. Denman George for a vaginal exam in some time.  No vaginal mass or source of bleeding was identified on vaginal examination.  She had recurrent bleeding, so Dr. Venetia Maxon had held her rivaroxaban.  She was found to have another urinary tract infection, this time with Staphylococcal hemolyticus, which was treated with nitrofurantoin.     CT chest, abdomen and pelvis  in June of 2021 revealed an excellent response to treatment.  The large right middle lobe mass had resolved, and the right lower lobe pulmonary nodules remain stable at 5 mm.  There was no evidence of metastatic disease in the abdomen or pelvis.  She had had right calf tenderness, redness and warmth and was seen by by Dr. Venetia Maxon.  Ultrasound was negative for DVT.  He felt this represented cellulitis, so treated her with cephalexin 500 mg. CT chest in September 2021 revealed stable small pulmonary nodules without evidence of metastatic disease within the abdomen and pelvis.  She was doing well at the time, so was scheduled for 3 month follow up with repeat imaging.  The patient presented in November 2021 with headache, nausea and vomiting, as well as dizziness and weight loss.  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 Pathology confirmed metastatic endometrial adenocarcinoma.  She did well postop and was tapered off dexamethasone. She continues to follow with Dr. Mickeal Skinner and MRI brain from September 2022 revealed stable post-surgical and post-treatment appearance of the right cerebellar hemisphere. No new intracranial metastatic lesion is identified. CT chest, abdomen and pelvis from October 28th,2022 revealed no new evidence of metastatic disease in the chest, abdomen or pelvis, with stable 5 mm right lower lobe pulmonary nodules.  There is a right thyroid nodule stable with respect imaged portions  less than 1.5 cm on previous imaging, and mild hepatic steatosis.  MRI of the brain in December with Dr. Mickeal Skinner remains stable.  Her last CA  125 in February remains normal.   INTERVAL HISTORY:  Kelli is here for routine follow up and she feels fairly poorly.  She went to the emergency room 06/16/2022. She reports of coughing up phlegm and does complain of sore throat. She has had severe anemia and is requiring transfusions. Stool hemoccults were negative in the emergency room but she does report blood in the stool . We are trying to get her in with a Gastroenterologist but there is a delay in getting an appointment.She reports of having a fever and her sister reports its been on and off and occasionally spikes for 2-3 weeks. We know she had a UTI with klebsiella and is on cefdinir for this but continues to have fevers. I am concerned that she has acute bronchitis as well and we need to broaden her antibiotic coverage. I will add Zithromax 500 mg daily for one week. She is set to get two units of packed red blood cells on 06/18/2022. Her  appetite is good, but she has lost 12 pounds since June.      Wt Readings from Last 3 Encounters:  07/07/22 188 lb (85.3 kg)  07/02/22 193 lb (87.5 kg)  06/30/22 193 lb 9.6 oz (87.8 kg)  Her hemoglobin is at 7.7, otherwise blood counts and chemistries are normal. We will start her on Zithromax 500 mg. She now has a severe left shift in her white blood cells with presence of promyelocytes and possible blasts. I am concerned about the possibility of chemotherapy induced myelodysplasia or leukemia. I have recommended she get a bone marrow test next week 06/23/2022. We will see her one week later to review the results. She denies nausea, vomiting, bowel issues, or abdominal pain.  She denies sore throat, cough, dyspnea, or chest pain.   REVIEW OF SYSTEMS:  Review of Systems  Constitutional: Negative.  Negative for appetite change, chills, fatigue, fever and unexpected weight  change.  HENT:  Negative.    Eyes: Negative.   Respiratory: Negative.  Negative for chest tightness, cough, hemoptysis, shortness of breath and wheezing.   Cardiovascular: Negative.  Negative for chest pain, leg swelling and palpitations.  Gastrointestinal: Negative.  Negative for abdominal distention, abdominal pain, blood in stool, constipation, diarrhea, nausea and vomiting.  Endocrine: Negative.   Genitourinary: Negative.  Negative for difficulty urinating, dysuria, frequency and hematuria.   Musculoskeletal: Negative.  Negative for arthralgias, back pain, flank pain, gait problem and myalgias.  Skin: Negative.   Neurological:  Negative for extremity weakness, gait problem, headaches, light-headedness, numbness, seizures and speech difficulty.  Hematological: Negative.   Psychiatric/Behavioral: Negative.  Negative for depression and sleep disturbance. The patient is not nervous/anxious.       VITALS:  Blood pressure 130/67, pulse (!) 122, temperature 99.7 F (37.6 C), temperature source Oral, resp. rate (!) 22, height '5\' 6"'  (1.676 m), weight 202 lb 14.4 oz (92 kg), SpO2 98 %.     Wt Readings from Last 3 Encounters:  07/07/22 188 lb (85.3 kg)  07/02/22 193 lb (87.5 kg)  06/30/22 193 lb 9.6 oz (87.8 kg)    Body mass index is 32.75 kg/m.   Performance status (ECOG): 0 - Asymptomatic   PHYSICAL EXAM:  Physical Exam Constitutional:      General: She is not in acute distress.    Appearance: Normal appearance. She is normal weight.  HENT:     Head: Normocephalic and atraumatic.  Eyes:     General: No scleral icterus.    Extraocular Movements: Extraocular movements intact.     Conjunctiva/sclera: Conjunctivae normal.     Pupils: Pupils are equal, round, and reactive to light.  Cardiovascular:     Rate and Rhythm: Regular rhythm. Tachycardia present.     Pulses: Normal pulses.     Heart sounds: Normal heart sounds. No murmur heard.    No friction rub. No gallop.  Pulmonary:      Effort: Pulmonary effort is normal. No respiratory distress.     Breath sounds: Normal breath sounds.  Abdominal:     General: Bowel sounds are normal. There is no distension.     Palpations: Abdomen is soft. There is no hepatomegaly, splenomegaly or mass.     Tenderness: There is no abdominal tenderness.  Musculoskeletal:        General: Normal range of motion.     Cervical back: Normal range of motion and neck supple.     Right lower leg: No edema.     Left lower leg: No edema.  Lymphadenopathy:     Cervical: No cervical adenopathy.  Skin:    General: Skin is warm and  dry.  Neurological:     General: No focal deficit present.     Mental Status: She is alert and oriented to person, place, and time. Mental status is at baseline.  Psychiatric:        Mood and Affect: Mood normal.        Behavior: Behavior normal.        Thought Content: Thought content normal.        Judgment: Judgment normal.      LABS:        Latest Ref Rng & Units 07/07/2022   12:00 AM 06/30/2022   12:00 AM 06/23/2022   12:00 AM  CBC  WBC   4.9     7.3     5.3      Hemoglobin 12.0 - 16.0 8.2     9.0     8.7      Hematocrit 36 - 46 '25     28     26      ' Platelets 150 - 400 K/uL 113     145     161         This result is from an external source.        Latest Ref Rng & Units 07/07/2022   12:00 AM 06/30/2022   12:00 AM 06/23/2022   12:00 AM  CMP  BUN 4 - '21 9     10     10      ' Creatinine 0.5 - 1.1 0.5     0.5     0.6      Sodium 137 - 147 135     133     134      Potassium 3.5 - 5.1 mEq/L 3.8     4.0     4.2      Chloride 99 - 108 98     99     99      CO2 13 - '22 28     26     27      ' Calcium 8.7 - 10.7 9.3     9.3     8.8      Alkaline Phos 25 - 125 94     142     113      AST 13 - 35 34     42     41      ALT 7 - 35 U/L 30     44     29         This result is from an external source.      Recent Labs       Lab Results  Component Value Date    PYP950 7.2 12/10/2021           STUDIES:  EXAM 06/16/22 CT ANGIOGRAPHY CHEST WITH CONTRAST IMPRESSION No pulmonary embolus Interval increase in size of trace to small volume pericardial effusion. Interval development of trace bilateral, right greater than left, pleural effusion. Couple pulmonary micronodules. No follow-up needed if patient is low risk (and has no known or suspected primary neoplasm). Non-contrast chest CT can  be considered in 12 months if patient is high-risk. This recommendation follows the consensus statement: Guidelines for Management of Incidental Pulmonary Nodules Detected on CT images: From the Fleischner Society 2017; Radiology 2017; [id number]   EXAM:06/11/22 CT CHEST, ABDOMEN, AND PELVIS WITH CONTRAST IMPRESSION: Trace pericardial effusion, new since prior study. Otherwise stable examination, without evidence of  recurrent or progressive metastatic disease. Stbale sub 5 mm pulmonary nodules. No acute intra-abdominal or intrapelvic process. Aortic Atherosclerosis (ICD10-170.0)       EXAM:03/30/2022 MRI HEAD WITHOUT AND WITH CONTRAST   FINDINGS: Brain:   Mild generalized parenchymal atrophy.   Sequela of prior right suboccipital craniotomy and right cerebellar mass resection. Unchanged mild non-masslike enhancement, and small amount of chronic blood products, at the resection site.   No new enhancing intracranial lesions are identified.   Multifocal T2 FLAIR hyperintense signal abnormality within the cerebral white matter, nonspecific but compatible with moderate chronic small vessel ischemic disease.   There is no acute infarct.   No extra-axial fluid collection.   No midline shift.   Vascular: Maintained flow voids within the proximal large arterial vessels.   Skull and upper cervical spine: Right suboccipital cranioplasty. No focal suspicious marrow lesion. Incompletely assessed cervical spondylosis.   Sinuses/Orbits: No mass or acute finding within the imaged  orbits. Tiny mucous retention cyst within the right maxillary sinus.   Other: Trace fluid within the right mastoid air cells.   IMPRESSION: Stable post-treatment changes within the right cerebellar hemisphere.   No new intracranial metastasis is identified.   Moderate chronic small vessel ischemic disease within the cerebral white matter, stable.     HISTORY:    Allergies: No Known Allergies   Current Medications:       Current Outpatient Medications  Medication Sig Dispense Refill   acetaminophen (TYLENOL) 500 MG tablet Take 500 mg by mouth every 6 (six) hours as needed for pain or headache.       Calcium Carbonate (CALTRATE 600 PO) Take 1 tablet by mouth in the morning and at bedtime.       HYDROcodone-acetaminophen (NORCO/VICODIN) 5-325 MG tablet Take 1 tablet by mouth every 4 (four) hours as needed.       metoprolol succinate (TOPROL-XL) 25 MG 24 hr tablet Take 1 tablet (25 mg total) by mouth 2 (two) times daily. 180 tablet 3   Multiple Vitamin (MULTIVITAMIN WITH MINERALS) TABS tablet Take 1 tablet by mouth daily. Centrum Silver       ondansetron (ZOFRAN) 4 MG tablet Take 1 tablet (4 mg total) by mouth every 4 (four) hours as needed for nausea. 90 tablet 3   potassium chloride SA (KLOR-CON M) 20 MEQ tablet Take 40 mEq by mouth 2 (two) times daily. Takes 5 tablets daily while taking Torsemide       Probiotic Product (PROBIOTIC PO) Take 1 capsule by mouth daily.       prochlorperazine (COMPAZINE) 10 MG tablet Take 1 tablet (10 mg total) by mouth every 6 (six) hours as needed for nausea or vomiting. 90 tablet 3    No current facility-administered medications for this visit.        ASSESSMENT & PLAN:    Assessment: 1. History of stage IB high-grade endometrial carcinoma treated with hysterectomy/bilateral salpingo-oophorectomy, followed by adjuvant radiation and brachytherapy in 2018.   2. 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  She had progression of her disease in August 2020. As her tumor was MSI-high, she was placed on palliative pembrolizumab. Unfortunately, she had progressive disease after 6 cycles of pembrolizumab.  She was therefore placed on palliative carboplatin/paclitaxel/bevacizumb. Bevacizumab was discontinued due to persistent vaginal bleeding/hematuria and epistaxis.  She completed 6 cycles of carboplatin/paclitaxel in May 2020. CT imaging of April 2023 remains stable.  3. Pulmonary emboli in February 2021.  She is at increased risk for thrombosis due to her malignancy, but due to recurrent bleeding, we discontinued rivaroxaban .  She has not had evidence of recurrent thrombosis.   4. Solitary right cerebellar metastasis, status post preoperative stereotactic radio surgery followed by surgical resection.   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  She continues to follow with Dr. Mickeal Skinner of Neuro[facility]Oncology.  MRI in December 2022 revealed stable post-surgical and post-treatment appearance of the right cerebellar hemisphere. No new intracranial metastatic lesion is identified.   5. Severe anemia, much worse than previous. She has no evidence of hemolytic anemia, nutritional deficiency, or multiple myeloma. I therefore think that she is losing blood in the GI tract. However the other possibility would be bone marrow dysfunction, but her white and red cells remain normal. We will schedule a bone marrow for next week. Since her iron test equivocal, I will check a transferrin receptor today.   6.  Multiple bilateral pulmonary nodules which are stable and measuring up to 5 mm in diameter.     Plan:   I will schedule a bone marrow next week and recheck her labs at that time. I will bring her back in 2 weeks to review the results. We will continue to try to get her in with a  GI specialist ASAP. She will be  transfused with 2 units of red packed blood cells tomorrow.  Since her iron test equivocal, I will check a transferrin receptor today. Then we can decide if she needs IV iron as well.The patient and her sister understand the plans discussed today and are in agreement with them.  She knows to contact our office if she develops concerns prior to her next appointment.   I provided 30 minutes of face-to-face time during this this encounter and > 50% was spent counseling as documented under my assessment and plan.        I,Gabriella Ballesteros,acting as a scribe for Derwood Kaplan, MD.,have documented all relevant documentation on the behalf of Derwood Kaplan, MD,as directed by  Derwood Kaplan, MD while in the presence of Derwood Kaplan, MD.

## 2022-07-09 ENCOUNTER — Encounter: Payer: Self-pay | Admitting: Hematology and Oncology

## 2022-07-09 ENCOUNTER — Ambulatory Visit: Payer: Medicare Other

## 2022-07-09 LAB — TYPE AND SCREEN
ABO/RH(D): O POS
Antibody Screen: NEGATIVE
Unit division: 0

## 2022-07-09 LAB — BPAM RBC
Blood Product Expiration Date: 202310032359
ISSUE DATE / TIME: 202308310731
Unit Type and Rh: 5100

## 2022-07-14 ENCOUNTER — Other Ambulatory Visit: Payer: Self-pay | Admitting: Oncology

## 2022-07-14 ENCOUNTER — Encounter: Payer: Self-pay | Admitting: Oncology

## 2022-07-14 ENCOUNTER — Inpatient Hospital Stay: Payer: Medicare Other

## 2022-07-14 ENCOUNTER — Inpatient Hospital Stay: Payer: Medicare Other | Attending: Internal Medicine | Admitting: Oncology

## 2022-07-14 VITALS — BP 127/64 | HR 118 | Temp 98.0°F | Resp 18 | Ht 66.0 in | Wt 192.8 lb

## 2022-07-14 DIAGNOSIS — B952 Enterococcus as the cause of diseases classified elsewhere: Secondary | ICD-10-CM | POA: Insufficient documentation

## 2022-07-14 DIAGNOSIS — M545 Low back pain, unspecified: Secondary | ICD-10-CM

## 2022-07-14 DIAGNOSIS — R82998 Other abnormal findings in urine: Secondary | ICD-10-CM

## 2022-07-14 DIAGNOSIS — Z79899 Other long term (current) drug therapy: Secondary | ICD-10-CM | POA: Insufficient documentation

## 2022-07-14 DIAGNOSIS — D649 Anemia, unspecified: Secondary | ICD-10-CM

## 2022-07-14 DIAGNOSIS — D469 Myelodysplastic syndrome, unspecified: Secondary | ICD-10-CM | POA: Diagnosis present

## 2022-07-14 DIAGNOSIS — C541 Malignant neoplasm of endometrium: Secondary | ICD-10-CM | POA: Diagnosis not present

## 2022-07-14 DIAGNOSIS — N390 Urinary tract infection, site not specified: Secondary | ICD-10-CM | POA: Insufficient documentation

## 2022-07-14 LAB — CBC AND DIFFERENTIAL
HCT: 28 — AB (ref 36–46)
Hemoglobin: 9.1 — AB (ref 12.0–16.0)
Neutrophils Absolute: 2.34
Platelets: 74 10*3/uL — AB (ref 150–400)
WBC: 4.5

## 2022-07-14 LAB — BASIC METABOLIC PANEL
BUN: 9 (ref 4–21)
CO2: 29 — AB (ref 13–22)
Chloride: 97 — AB (ref 99–108)
Creatinine: 0.5 (ref 0.5–1.1)
Glucose: 159
Potassium: 3.6 mEq/L (ref 3.5–5.1)
Sodium: 137 (ref 137–147)

## 2022-07-14 LAB — CBC: RBC: 3.33 — AB (ref 3.87–5.11)

## 2022-07-14 LAB — HEPATIC FUNCTION PANEL
ALT: 23 U/L (ref 7–35)
AST: 23 (ref 13–35)
Alkaline Phosphatase: 95 (ref 25–125)
Bilirubin, Total: 0.8

## 2022-07-14 LAB — COMPREHENSIVE METABOLIC PANEL
Albumin: 3.6 (ref 3.5–5.0)
Calcium: 9.2 (ref 8.7–10.7)

## 2022-07-14 NOTE — Progress Notes (Signed)
Brianna Herring  [address] [place],  [place]  [address] [phone]  Clinic Day: 07/14/22  Referring physician: Venetia Maxon, [name], *  ASSESSMENT & PLAN:   Assessment & Plan: Myelodysplasia (myelodysplastic syndrome) ([place]) Newly diagnosed myelodysplasia-excess blasts.  She has associated anemia requiring regular transfusion.  Her hemoglobin has drifted down from 9 to 8.2, so I will arrange for her to have 1 unit of packed red blood cells as an outpatient tomorrow.  I discussed with treatment of her anemia with epoetin injections, but the patient and her sister are uncertain due to the increased risk of blood clots.  I also discussed treatment of her myelodysplasia with azacitidine, as well as potential side effects.  She is not keen on taking any therapy.  I advised her of the risk of the myelodysplasia progressing to acute leukemia.  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  She continues to follow with Dr. Mickeal Skinner of Neuro-Oncology.  MRI on August 17 revealed stable post-surgical and post-treatment appearance of the right cerebellar hemisphere. No new intracranial metastatic lesion is identified.  Stable white matter changes consistent with chronic microvascular ischemia seen.   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  She had progression of her disease in August 2020. As her tumor was MSI-high, she was placed on palliative pembrolizumab. Unfortunately, she had progressive disease after 6 cycles of pembrolizumab.  She was therefore placed on  palliative carboplatin/paclitaxel/bevacizumb. Bevacizumab was discontinued due to persistent vaginal bleeding/hematuria and epistaxis.  She completed 6 cycles of carboplatin/paclitaxel in May 2020.  There was no definite evidence of recurrence on CT imaging on August 4.  Stable sub-5 mm pulmonary nodules in the right lung were identified.   Endometrial adenocarcinoma ([place]) History of stage IB high-grade endometrial carcinoma treated with hysterectomy/bilateral salpingo-oophorectomy, followed by adjuvant radiation and brachytherapy in 2018.    The patient's sister has requested a referral to [facility] in [place] and we will be glad to arrange that. I will recheck a urine and culture today. We will need to check her weekly with labs due to her frequent transfusion requirements. They are still unable to decide whether she would want to pursue chemotherapy but I do not think she is physically capable of doing so. She appears mentally unable to make decisions at this time and I discussed this with Ava, but they still wish for her to decide what she wishes to do.The patient seems to understand the plans discussed today and is in agreement with them.  She knows to contact our office if she develops concerns prior to her next appointment.     I provided 20 minutes of face-to-face time during this encounter and > 50% was spent counseling as documented under my assessment and plan.       Derwood Kaplan, MD  [place] [address] [place] [address] Dept: [phone] Dept Fax: [phone]   Orders Placed This Encounter  Procedures  . CBC and differential    This external order was created through the Results Console.  . CBC    This external order was created through the  Results Console.  . Basic metabolic panel    This external order was created through the Results Console.  . Comprehensive metabolic panel     This external order was created through the Results Console.  . Hepatic function panel    This external order was created through the Results Console.      CHIEF COMPLAINT:  CC: Myelodysplasia with excess blasts  Current Treatment: Supportive care  HISTORY OF PRESENT ILLNESS:  Brianna Herring is a 66 y.o. female with recurrent endometrial carcinoma with lung metastasis.  She was originally diagnosed with stage IB (T1b N0 M0) high-grade endometrial carcinoma in late 2017. She had a robotic hysterectomy and bilateral salpingo-oophorectomy in January 2018.  Pathology revealed a 4 cm, grade 3, endometrioid adenocarcinoma with over 50% invasion of the myometrium and lymphovascular invasion.  Six lymph nodes were negative for metastasis.  Margins were clear.  She received adjuvant external beam radiation, completed in April 2018, followed by vaginal brachytherapy to the vaginal cuff.   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  MSI was high.  A referral to [facility] was ordered in 2018, but the patient never followed through.   CT chest, abdomen and pelvis prior to surgery revealed subtle low-attenuation in the central uterus, as well as small bibasilar pulmonary nodules, which were suspicious for metastatic disease, but only up to 6 mm in size.  PET scan in February 2018 did not reveal hypermetabolic activity in the lung nodules or in the left inguinal node or portacaval node.  There was mild uptake of the pelvic sidewall, which was felt to represent postoperative changes.   The patient was seen in routine follow-up in March 2019.  Repeat CT chest revealed new pulmonary nodules measuring up to 1.8 cm in the right middle lobe and not in the area of the prior tiny nodules, with a 1.5 cm left lower lobe cavitary nodule.  The previously seen changes of the pelvic sidewall on the prior PET scan had resolved.  PET in May revealed hypermetabolic  activity within the pulmonary nodules consistent with metastatic disease.  There was mild hypermetabolic activity within the portacaval node, which was still felt to be reactive.  We recommended 6 cycles of carboplatin and paclitaxel, but when she presented for chemotherapy education on May 29, her sister requested a referral to a pulmonologist for a definitive tissue diagnosis.  Dr. Melvyn Novas at [facility] pulmonology went over all the risks and benefits of having a bronchoscopy.  The patient decided it was not worth the risk to undergo biopsy and decided to proceed with chemotherapy.  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  We did obtain MLH1 methylation analysis and there was hypermethylation of MLH1, so this does not likely represent Lynch syndrome.    She received palliative carboplatin/paclitaxel from June to September 2019, completing 6 cycles.  Repeat CT imaging after 3 cycles of carboplatin/paclitaxel revealed a good response with a decrease in the size and number of pulmonary nodules.  She had chronic hematuria and saw Dr. Nila Nephew, and cystoscopy revealed findings of radiation cystitis.  She has also had recurrent urinary tract infections, so was placed on D-mannose for prevention.  CT chest, abdomen and pelvis in October 2019 revealed stable to slightly decreased metastatic lung lesions with no other sites of metastatic disease.  Chemotherapy was discontinued and she  was placed on tamoxifen 20 mg daily for maintenance.  Repeat CT scans in January 2020 revealed scattered small bilateral pulmonary nodules that were overall grossly unchanged, including a 10 mm irregular nodule of the right upper lobe and two 6 mm nodules of the right lower lobe.  CT imaging in April was fairly stable with just a slight increase in the right middle lobe pulmonary nodule, which was difficult to assess due to motion  artifact.  Unfortunately, CT imaging in August revealed considerable enlargement of the right middle lobe pulmonary nodule from 1 cm to 2.3 cm consistent with progressive pulmonary metastasis.  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  [facility] CDx testing reveals MSI high with an increased TMB (tumor mutational burden) making the patient eligible for immunotherapy. Tamoxifen was discontinued.  She was placed on palliative pembrolizumab every 3 weeks in October 2020.  She presented to the emergency department with shortness of breath in November 2020.  CT angiogram chest revealed small filling defects in the right lower lobe and probably right upper lobe pulmonary arteries consistent with small pulmonary emboli. The metastatic lesion within the right middle lobe was stable at 3 cm.  There was scattered subsegmental atelectasis.  The patient was hospitalized at that time, as she presented with hypoxia.  She initially was given enoxaparin and then transitioned to rivaroxaban.     After completion of 6 cycles of pembrolizumab, she had progressive disease within the chest.  CT chest, abdomen, and pelvis in February 2021 revealed a dramatic increase in the size of the dominant right middle lobe pulmonary nodule, now measuring 5.1 x 5.1 cm, previously measuring 2.4 x 2.3 cm.  The smaller pulmonary nodules in the right lower lobe were stable, but one new pulmonary nodule was seen within the central right middle lobe measuring 14 mm.  There was no evidence of local recurrence of endometrial carcinoma in the pelvis or evidence of metastatic disease in the abdomen or pelvis.  The bladder, kidneys and ureters appeared normal.  In light of these results, her treatment was changed to carboplatin/paclitaxel/bevacizumab beginning in February. She presented to the  emergency department with vaginal bleeding in March.  This was felt to be hematuria due to urinary tract infection, and she was found to have Klebsiella pneumoniae in the urine and was discharged on [place].  She had anemia felt to be secondary to blood loss and her hemoglobin dropped to 8.8, but was back up to 9.5 at the time of discharge. CT chest angiogram did not reveal any evidence of pulmonary embolism.  There was an interval decrease in the pulmonary nodules.  The rivaroxaban was resumed on discharge.  Bevacizumab was permanently discontinued due to continued bleeding.  She saw Dr. Denman George for a vaginal exam in some time.  No vaginal mass or source of bleeding was identified on vaginal examination.  She had recurrent bleeding, so Dr. Venetia Maxon had held her rivaroxaban.  She was found to have another urinary tract infection, this time with Staphylococcal hemolyticus, which was treated with nitrofurantoin.     CT chest, abdomen and pelvis  in June of 2021 revealed an excellent response to treatment.  The large right middle lobe mass had resolved, and the right lower lobe pulmonary nodules remain stable at 5 mm.  There was no evidence of metastatic disease in the abdomen or pelvis. CT  chest in September 2021 revealed stable small pulmonary nodules without evidence of metastatic disease within the abdomen and pelvis.  She was doing well at the time, so was scheduled for 3 month follow up with repeat imaging.  The patient presented in November 2021 with headache, nausea and vomiting, as well as dizziness and weight loss.  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 Pathology confirmed metastatic endometrial adenocarcinoma.  She did well postop and was tapered off dexamethasone. She continues to follow with Dr. Mickeal Skinner and MRI brain from September 2022 revealed stable post-surgical and post-treatment appearance  of the right cerebellar hemisphere. No new intracranial metastatic lesion is identified. CT chest, abdomen and pelvis 2022 revealed no new evidence of metastatic disease in the chest, abdomen or pelvis, with stable 5 mm right lower lobe pulmonary nodules.  MRI of the brain in December with Dr. Mickeal Skinner remains stable.  CT imaging in April did not reveal any evidence of recurrent disease.  There were stable sub-5 mm bilateral pulmonary nodules.    She recently presented with severe anemia requiring transfusion.  Evaluation did not reveal a specific etiology.  CT chest, abdomen and pelvis in the emergency room on August 4 revealed a trace pericardial effusion, otherwise stable examination without evidence of recurrent or progressive metastasis.  She underwent bone marrow evaluation which revealed myelodysplasia with excess blasts.  She has not made a decision regarding epoetin injections or treatment with azacitidine, which we have discussed on several occasions.  INTERVAL HISTORY:  Angalena is here today for repeat clinical assessment and states she remains fatigued. She sleeps most of the day, with an ECOG performance status of 2-3.  She denies significant shortness of breath.  She is still very inactive and has been using a walker now.  She requires assistance with her normal ADLs.  She is staying with her sister.  She denies any neurologic symptoms.  She denies easy bruising or abnormal bleeding.  She denies fevers or chills. She denies pain. Her appetite is good. Her sister said the patient previously states she would not wish to have any further chemotherapy. They have requested a referral to [facility] in [place] and we will arrange that.  She went to the ER over the weekend for painful bumps of her legs and U/S was negative for blood clots. Her blood pressure medication has been increased. She did receive 1 unit of PRBC's last week. She complains of lower back pain and we will recheck her urine  since she has had a couple of UTI's recently. She asks about a letter stating her disability, she has not been able to work since the end of July. Her WBC's ar 4.5, hemoglobin 9.1 and platelets 74,000. She has a dramatic left shift with 2% blasts. I explained this is evolving into acute leukemia.  REVIEW OF SYSTEMS:  Review of Systems  Constitutional:  Positive for fatigue. Negative for appetite change, chills, fever and unexpected weight change.  HENT:  Negative.  Negative for lump/mass, mouth sores and sore throat.   Eyes: Negative.   Respiratory: Negative.  Negative for cough and shortness of breath.   Cardiovascular: Negative.  Negative for chest pain and leg swelling.  Gastrointestinal: Negative.  Negative for abdominal pain, constipation, diarrhea, nausea and vomiting.  Endocrine: Negative.  Negative for hot flashes.  Genitourinary: Negative.  Negative for difficulty urinating, dyspareunia, dysuria, frequency and hematuria.  Musculoskeletal:  Positive for back pain and gait problem. Negative for arthralgias and myalgias.  Skin: Negative.  Negative for rash.  Neurological:  Positive for extremity weakness and gait problem. Negative for dizziness and headaches.  Hematological:  Negative for adenopathy.  Psychiatric/Behavioral:  Positive for confusion, decreased concentration and depression. Negative for sleep disturbance. The patient is not nervous/anxious.      VITALS:  Blood pressure 127/64, pulse (!) 118, temperature 98 F (36.7 C), temperature source Oral, resp. rate 18, height _0  (1.676 m), weight 192 lb 12.8 oz (87.5 kg), SpO2 96 %.  Wt Readings from Last 3 Encounters:  07/28/22 190 lb 8 oz (86.4 kg)  07/23/22 193 lb (87.5 kg)  07/22/22 192 lb 11.2 oz (87.4 kg)    Body mass index is 31.12 kg/m.  Performance status (ECOG): 2 - Symptomatic, <50% confined to bed  PHYSICAL EXAM:  Physical Exam Vitals and nursing note reviewed.  Constitutional:      General: She is not in  acute distress.    Appearance: She is ill-appearing.  HENT:     Head: Normocephalic and atraumatic.     Nose: Nose normal.     Mouth/Throat:     Mouth: Mucous membranes are moist.     Pharynx: Oropharynx is clear. No oropharyngeal exudate or posterior oropharyngeal erythema.  Eyes:     General: No scleral icterus.    Extraocular Movements: Extraocular movements intact.     Conjunctiva/sclera: Conjunctivae normal.     Pupils: Pupils are equal, round, and reactive to light.  Cardiovascular:     Rate and Rhythm: Regular rhythm. Tachycardia present.     Heart sounds: Normal heart sounds. No murmur heard.    No friction rub. No gallop.  Pulmonary:     Effort: Pulmonary effort is normal.     Breath sounds: Normal breath sounds. No wheezing, rhonchi or rales.  Abdominal:     General: There is no distension.     Palpations: Abdomen is soft. There is no hepatomegaly, splenomegaly or mass.     Tenderness: There is no abdominal tenderness.  Musculoskeletal:        General: Normal range of motion.     Cervical back: Normal range of motion and neck supple. No tenderness.     Right lower leg: No edema.     Left lower leg: No edema.  Lymphadenopathy:     Cervical: No cervical adenopathy.     Upper Body:     Right upper body: No supraclavicular or axillary adenopathy.     Left upper body: No supraclavicular or axillary adenopathy.  Skin:    General: Skin is warm and dry.     Coloration: Skin is not jaundiced.     Findings: No rash.  Neurological:     General: No focal deficit present.     Cranial Nerves: No cranial nerve deficit.     Motor: Weakness present.     Gait: Gait abnormal.  Psychiatric:        Mood and Affect: Mood normal.        Behavior: Behavior normal.    LABS:      Latest Ref Rng & Units 07/22/2022   12:00 AM 07/14/2022   12:00 AM 07/07/2022   12:00 AM  CBC  WBC  5.6     4.5     4.9      Hemoglobin 12.0 - 16.0 8.1     9.1     8.2  Hematocrit 36 - 46 _0 Platelets 150 - 400 K/uL 54     74     113         This result is from an external source.      Latest Ref Rng & Units 07/22/2022   12:00 AM 07/14/2022   12:00 AM 07/07/2022   12:00 AM  CMP  BUN 4 - _1 Creatinine 0.5 - 1.1 0.4     0.5     0.5      Sodium 137 - 147 134     137     135      Potassium 3.5 - 5.1 mEq/L 3.7     3.6     3.8      Chloride 99 - 108 98     97     98      CO2 13 - _2 Calcium 8.7 - 10.7 8.6     9.2     9.3      Alkaline Phos 25 - 125 90     95     94      AST 13 - 35 32     23     34      ALT 7 - 35 U/L _3 This result is from an external source.     No results found for: "CEA1", "CEA" / No results found for: "CEA1", "CEA" No results found for: "PSA1" No results found for: "CAN199" Lab Results  Component Value Date   CAN125 7.2 12/10/2021    Lab Results  Component Value Date   TOTALPROTELP 7.8 06/14/2022   ALBUMINELP 2.5 (L) 06/14/2022   A1GS 0.5 (H) 06/14/2022   A2GS 1.3 (H) 06/14/2022   BETS 1.0 06/14/2022   GAMS 2.4 (H) 06/14/2022   MSPIKE Not Observed 06/14/2022   SPEI Comment 06/14/2022   Lab Results  Component Value Date   TIBC 227 (L) 06/10/2022   FERRITIN 762 (H) 06/10/2022   IRONPCTSAT 12 06/10/2022   Lab Results  Component Value Date   LDH 192 06/09/2022    STUDIES:  No results found.    HISTORY:   Past Medical History:  Diagnosis Date  . Acquired thrombophilia ([place])   . Anemia of chronic disease   . Aortic atherosclerosis ([place])   . Atherosclerosis of native coronary artery of native heart with angina pectoris ([place])   . BMI 34.0-34.9,adult   . Body mass index (BMI) 35.0-35.9, adult 09/29/2020  . Cancer [facility])    endometrial  . Cancer, metastatic to lung ([place])   . Endometrial adenocarcinoma ([place]) 11/03/2016  . Endometrioid adenocarcinoma of uterus ([place])   . History of radiation therapy 03/02/17-03/16/17   vaginal cuff treated to 18 Gy with 3  fractions of 6 Gy  . Hyperglycemia   . Labile blood pressure   . 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[place]) 09/19/2020  . Morbid obesity ([place])   . Multiple lung nodules on CT 03/14/2018   CT 11/15/16  MPN's largest 6 mm - PET 12/15/16 neg but largest < 20m - CT chest 01/31/18  Largest ?RML  proximal/central  @ 1.8 x 1.5 and LLL peripheral cavitary x  1.5 x1.4  - PET 03/22/18 :  Two distinct nodules, def growing since 01/31/18 both hypermetabolic   . Phlebitis alone left leg  4-5 yrs ago  . Pulmonary embolism on right ([place])   . Redness and swelling of lower leg   . Secondary malignant neoplasm of lung ([place]) 07/18/2019  . 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 APPLICATION OF CRANIAL NAVIGATION N/A 10/08/2020   Procedure: APPLICATION OF CRANIAL NAVIGATION;  Surgeon: Vallarie Mare, MD;  Location: [place];  Service: Neurosurgery;  Laterality: N/A;  . CRANIOTOMY N/A 10/08/2020   Procedure: CRANIOTOMY - Suboccipital TUMOR EXCISION - Brain Lab;  Surgeon: Vallarie Mare, MD;  Location: [place];  Service: Neurosurgery;  Laterality: N/A;  suboccipital  . ROBOTIC ASSISTED TOTAL HYSTERECTOMY WITH BILATERAL SALPINGO OOPHERECTOMY N/A 11/18/2016   Procedure: XI ROBOTIC ASSISTED TOTAL HYSTERECTOMY WITH BILATERAL SALPINGO OOPHORECTOMY;  Surgeon: Everitt Amber, MD;  Location: [facility];  Service: Gynecology;  Laterality: N/A;  . SENTINEL NODE BIOPSY N/A 11/18/2016   Procedure: SENTINEL NODE BIOPSY;  Surgeon: Everitt Amber, MD;  Location: [facility];  Service: Gynecology;  Laterality: N/A;    Family History  Problem Relation Age of Onset  . Atrial fibrillation Mother   . Lung cancer Father   . Breast cancer Sister   . Heart failure Sister     Social History:  reports that she has never smoked. She has never used smokeless tobacco. She reports that she does not drink alcohol and does not use drugs.The patient is  accompanied by her sister today.  Allergies: No Known Allergies  Current Medications: Current Outpatient Medications  Medication Sig Dispense Refill  . acetaminophen (TYLENOL) 500 MG tablet Take 500 mg by mouth every 6 (six) hours as needed for pain or headache.    . Calcium Carbonate (CALTRATE 600 PO) Take 1 tablet by mouth in the morning and at bedtime.    . fentaNYL (DURAGESIC) 12 MCG/HR Place 1 patch onto the skin every 3 (three) days. 5 patch 0  . HYDROcodone-acetaminophen (NORCO/VICODIN) 5-325 MG tablet Take 1 tablet by mouth every 4 (four) hours as needed. 100 tablet 0  . LORazepam (ATIVAN) 0.5 MG tablet Take 1 tablet (0.5 mg total) by mouth every 6 (six) hours as needed for anxiety. 30 tablet 0  . metoprolol succinate (TOPROL-XL) 25 MG 24 hr tablet Take 1 tablet (25 mg total) by mouth 2 (two) times daily. 180 tablet 3  . Multiple Vitamin (MULTIVITAMIN WITH MINERALS) TABS tablet Take 1 tablet by mouth daily. Centrum Silver    . ondansetron (ZOFRAN) 4 MG tablet Take 1 tablet (4 mg total) by mouth every 4 (four) hours as needed for nausea. 90 tablet 3  . potassium chloride SA (KLOR-CON M) 20 MEQ tablet Take 40 mEq by mouth 2 (two) times daily. Takes 5 tablets daily while taking Torsemide    . Probiotic Product (PROBIOTIC PO) Take 1 capsule by mouth daily.    . prochlorperazine (COMPAZINE) 10 MG tablet Take 1 tablet (10 mg total) by mouth every 6 (six) hours as needed for nausea or vomiting. 90 tablet 3  . promethazine-dextromethorphan (PROMETHAZINE-DM) 6.25-15 MG/5ML syrup Take 5 mLs by mouth 4 (four) times daily as needed for cough. 118 mL 0   No current facility-administered medications for this visit.

## 2022-07-15 ENCOUNTER — Telehealth: Payer: Self-pay

## 2022-07-15 LAB — URINALYSIS, COMPLETE (UACMP) WITH MICROSCOPIC
Bilirubin Urine: NEGATIVE
Glucose, UA: NEGATIVE mg/dL
Hgb urine dipstick: NEGATIVE
Ketones, ur: NEGATIVE mg/dL
Leukocytes,Ua: NEGATIVE
Nitrite: NEGATIVE
Protein, ur: 30 mg/dL — AB
Specific Gravity, Urine: 1.028 (ref 1.005–1.030)
pH: 5 (ref 5.0–8.0)

## 2022-07-15 NOTE — Telephone Encounter (Signed)
Attempted to return call to sister Ava. No answer

## 2022-07-17 LAB — URINE CULTURE: Culture: >=100000 — AB

## 2022-07-20 ENCOUNTER — Telehealth: Payer: Self-pay

## 2022-07-20 ENCOUNTER — Other Ambulatory Visit: Payer: Self-pay | Admitting: Oncology

## 2022-07-20 DIAGNOSIS — Z87440 Personal history of urinary (tract) infections: Secondary | ICD-10-CM | POA: Insufficient documentation

## 2022-07-20 MED ORDER — AMPICILLIN 500 MG PO CAPS: 500.0000 mg | ORAL_CAPSULE | Freq: Four times a day (QID) | ORAL | 0 refills | Status: DC

## 2022-07-20 NOTE — Telephone Encounter (Signed)
Attempted to contact Brianna Herring to let her know about the message. No answer

## 2022-07-20 NOTE — Telephone Encounter (Signed)
-----   Message from Derwood Kaplan, MD sent at 07/20/2022 10:45 AM EDT ----- Regarding: call Tell Ava that culture from last week shows another UTI, with a different bacteria.  I will send in Ampicillin, rec start it today

## 2022-07-22 ENCOUNTER — Inpatient Hospital Stay (INDEPENDENT_AMBULATORY_CARE_PROVIDER_SITE_OTHER): Payer: Medicare Other | Admitting: Hematology and Oncology

## 2022-07-22 ENCOUNTER — Encounter: Payer: Self-pay | Admitting: Hematology and Oncology

## 2022-07-22 ENCOUNTER — Inpatient Hospital Stay: Payer: Medicare Other

## 2022-07-22 VITALS — BP 114/58 | HR 111 | Temp 98.6°F | Ht 66.0 in | Wt 192.7 lb

## 2022-07-22 DIAGNOSIS — D469 Myelodysplastic syndrome, unspecified: Secondary | ICD-10-CM

## 2022-07-22 DIAGNOSIS — N390 Urinary tract infection, site not specified: Secondary | ICD-10-CM | POA: Diagnosis not present

## 2022-07-22 DIAGNOSIS — B952 Enterococcus as the cause of diseases classified elsewhere: Secondary | ICD-10-CM | POA: Diagnosis not present

## 2022-07-22 DIAGNOSIS — D649 Anemia, unspecified: Secondary | ICD-10-CM

## 2022-07-22 LAB — CBC AND DIFFERENTIAL
HCT: 25 — AB (ref 36–46)
Hemoglobin: 8.1 — AB (ref 12.0–16.0)
Platelets: 54 10*3/uL — AB (ref 150–400)
WBC: 5.6

## 2022-07-22 LAB — HEPATIC FUNCTION PANEL
ALT: 18 U/L (ref 7–35)
AST: 32 (ref 13–35)
Alkaline Phosphatase: 90 (ref 25–125)
Bilirubin, Total: 0.8

## 2022-07-22 LAB — BASIC METABOLIC PANEL
BUN: 9 (ref 4–21)
CO2: 28 — AB (ref 13–22)
Chloride: 98 — AB (ref 99–108)
Creatinine: 0.4 — AB (ref 0.5–1.1)
Glucose: 120
Potassium: 3.7 mEq/L (ref 3.5–5.1)
Sodium: 134 — AB (ref 137–147)

## 2022-07-22 LAB — CBC
MCV: 83 (ref 81–99)
RBC: 3.02 — AB (ref 3.87–5.11)

## 2022-07-22 LAB — COMPREHENSIVE METABOLIC PANEL
Albumin: 3.6 (ref 3.5–5.0)
Calcium: 8.6 — AB (ref 8.7–10.7)

## 2022-07-22 NOTE — Assessment & Plan Note (Signed)
Newly diagnosed myelodysplasia-excess blasts.  She has associated anemia requiring regular transfusion.  Her hemoglobin has drifted down from 9.1 to 8.1, so I will arrange for her to have 1 unit of packed red blood cells as an outpatient tomorrow.  We have discussed with treatment of her anemia with epoetin injections, but the patient and her sister are uncertain due to the increased risk of blood clots.  We have also discussed treatment of her myelodysplasia with azacitidine, as well as potential side effects.  She is not keen on taking any therapy.  They are waiting on a second opinion at [place].  We will plan to see her back in 1 week for continued supportive care.

## 2022-07-22 NOTE — Assessment & Plan Note (Signed)
Urine culture from last week revealed greater than 100,000 colonies of Enterococcus faecalis.  She has been started on ampicillin 500 mg 4 times daily and is tolerating this without difficulty.  She knows to complete the entire prescription.

## 2022-07-22 NOTE — Progress Notes (Signed)
[name]  [address] [place],  [place]  [address] [phone]  Clinic Day:  07/22/2022  Referring physician: Venetia Herring, Brianna Herring, *  ASSESSMENT & PLAN:   Assessment & Plan: Myelodysplasia (myelodysplastic syndrome) ([place]) Newly diagnosed myelodysplasia-excess blasts.  She has associated anemia requiring regular transfusion.  Her hemoglobin has drifted down from 9.1 to 8.1, so I will arrange for her to have 1 unit of packed red blood cells as an outpatient tomorrow.  We have discussed with treatment of her anemia with epoetin injections, but the patient and her sister are uncertain due to the increased risk of blood clots.  We have also discussed treatment of her myelodysplasia with azacitidine, as well as potential side effects.  She is not keen on taking any therapy.  They are waiting on a second opinion at [place].  We will plan to see her back in 1 week for continued supportive care.  UTI (urinary tract infection) due to Enterococcus Urine culture from last week revealed greater than 100,000 colonies of Enterococcus faecalis.  She has been started on ampicillin 500 mg 4 times daily and is tolerating this without difficulty.  She knows to complete the entire prescription.   The patient understands the plans discussed today and is in agreement with them.  She knows to contact our office if she develops concerns prior to her next appointment.   I provided 15 minutes of face-to-face time during this encounter and > 50% was spent counseling as documented under my assessment and plan.    Brianna Pickles, PA-C  [facility] AT [facility] [address] [place] [address] Dept: [phone] Dept Fax: [phone]   Orders Placed This Encounter  Procedures   CBC and differential    This external order was created through the Results Console.   CBC    This external order was created through the  Results Console.   Basic metabolic panel    This external order was created through the Results Console.   Comprehensive metabolic panel    This external order was created through the Results Console.   Hepatic function panel    This external order was created through the Results Console.   CBC    This order was created through External Result Entry      CHIEF COMPLAINT:  CC: Myelodysplasia with excess blasts  Current Treatment: Transfusions as needed  HISTORY OF PRESENT ILLNESS:   Oncology History  Endometrial adenocarcinoma ([place])  10/19/2016 Cancer Staging   Staging form: Corpus Uteri - Carcinoma, AJCC 7th Edition - Clinical stage from 10/19/2016: FIGO Stage IB (T1b, N0, M0) - Signed by Derwood Kaplan, MD on 09/14/2021 Staged by: Managing physician Diagnostic confirmation: Positive histology Specimen type: Excision Histopathologic type: Adenocarcinoma, endocervical type Stage prefix: Initial diagnosis Laterality: Bilateral Tumor size (mm): 40 Histologic grade (G): G3 Lymph-vascular invasion (LVI): LVI present/identified, NOS Residual tumor (R): R0 - None Peritoneal cytology results: Negative Pelvic nodal status: Negative Para-aortic status: Negative Stage used in treatment planning: Yes National guidelines used in treatment planning: Yes Type of national guideline used in treatment planning: NCCN   11/03/2016 Initial Diagnosis   Endometrial adenocarcinoma ([place])   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  Last week she was  having some fevers and chills and was found to have a urinary tract infection.  She started ampicillin for Enterococcus yesterday.  She denies continued fevers or chills.  She denies suprapubic pain, dysuria or frequency of urination she remains fatigued.  She has  occasional nausea without vomiting, for which medication is effective.  She has a slight cough occasionally productive of white sputum.  She denies dyspnea or chest pain.  She denies pain. Her appetite is good. Her weight has been stable.  REVIEW OF SYSTEMS:  Review of Systems  Constitutional:  Positive for fatigue. Negative for appetite change, chills, fever and unexpected weight change.  HENT:   Negative for lump/mass, mouth sores and sore throat.   Respiratory:  Negative for cough and shortness of breath.   Cardiovascular:  Negative for chest pain and leg swelling.  Gastrointestinal:  Positive for nausea. Negative for abdominal pain, constipation, diarrhea and vomiting.  Endocrine: Negative for hot flashes.  Genitourinary:  Negative for difficulty urinating, dysuria, frequency and hematuria.   Musculoskeletal:  Negative for arthralgias, back pain and myalgias.  Skin:  Negative for rash.  Neurological:  Negative for dizziness and headaches.  Hematological:  Negative for adenopathy. Does not bruise/bleed easily.  Psychiatric/Behavioral:  Negative for depression and sleep disturbance. The patient is not nervous/anxious.     VITALS:  Blood pressure (!) 114/58, pulse (!) 111, temperature 98.6 F (37 C), temperature source Oral, height '5\' 6"'  (1.676 m), weight 192 lb 11.2 oz (87.4 kg), SpO2 98 %.  Wt Readings from Last 3 Encounters:  07/22/22 192 lb 11.2 oz (87.4 kg)  07/14/22 192 lb 12.8 oz (87.5 kg)  07/07/22 188 lb (85.3 kg)    Body mass index is 31.1 kg/m.  Performance status (ECOG): 2 - Symptomatic, <50% confined to bed  PHYSICAL EXAM:  Physical Exam Vitals and nursing note reviewed.  Constitutional:      General: She is not in acute distress.    Appearance: Normal appearance.  HENT:     Head: Normocephalic and atraumatic.     Mouth/Throat:     Mouth: Mucous membranes are moist.     Pharynx: Oropharynx is clear. No oropharyngeal exudate or posterior oropharyngeal erythema.   Eyes:     General: No scleral icterus.    Extraocular Movements: Extraocular movements intact.     Conjunctiva/sclera: Conjunctivae normal.     Pupils: Pupils are equal, round, and reactive to light.  Cardiovascular:     Rate and Rhythm: Normal rate and regular rhythm.     Heart sounds: Normal heart sounds. No murmur heard.    No friction rub. No gallop.  Pulmonary:     Effort: Pulmonary effort is normal.     Breath sounds: Normal breath sounds. No wheezing, rhonchi or rales.  Abdominal:     General: There is no distension.     Palpations: Abdomen is soft. There is no hepatomegaly, splenomegaly or mass.     Tenderness: There is no abdominal tenderness.  Musculoskeletal:        General: Normal range of motion.     Cervical back: Normal range of motion and neck supple. No tenderness.     Right lower leg: No edema.     Left lower leg: No edema.  Lymphadenopathy:     Cervical: No cervical adenopathy.     Upper Body:     Right upper body: No supraclavicular or axillary adenopathy.     Left upper body: No supraclavicular or axillary adenopathy.  Skin:    General: Skin is warm and dry.     Coloration: Skin is not jaundiced.     Findings: No rash.  Neurological:     Mental Status: She is alert and oriented to person, place, and time.     Cranial Nerves: No cranial nerve deficit.  Psychiatric:        Mood and Affect: Mood normal.        Behavior: Behavior normal.        Thought Content: Thought content normal.   LABS:      Latest Ref Rng & Units 07/22/2022   12:00 AM 07/14/2022   12:00 AM 07/07/2022   12:00 AM  CBC  WBC  5.6     4.5     4.9      Hemoglobin 12.0 - 16.0 8.1     9.1     8.2      Hematocrit 36 - 46 '25     28     25      ' Platelets 150 - 400 K/uL 54     74     113         This result is from an external source.      Latest Ref Rng & Units 07/22/2022   12:00 AM 07/14/2022   12:00 AM 07/07/2022   12:00 AM  CMP  BUN 4 - '21 9     9     9      ' Creatinine 0.5 - 1.1  0.4     0.5     0.5      Sodium 137 - 147 134     137     135      Potassium 3.5 - 5.1 mEq/L 3.7     3.6     3.8      Chloride 99 - 108 98     97     98      CO2 13 - '22 28     29     28      ' Calcium 8.7 - 10.7 8.6     9.2     9.3      Alkaline Phos 25 - 125 90     95     94      AST 13 - 35 32     23     34      ALT 7 - 35 U/L '18     23     30         ' This result is from an external source.     No results found for: "CEA1", "CEA" / No results found for: "CEA1", "CEA" No results found for: "PSA1" No results found for: "CAN199" Lab Results  Component Value Date   CAN125 7.2 12/10/2021    Lab Results  Component Value Date   TOTALPROTELP 7.8 06/14/2022   ALBUMINELP 2.5 (L) 06/14/2022   A1GS 0.5 (H) 06/14/2022   A2GS 1.3 (H) 06/14/2022   BETS 1.0 06/14/2022   GAMS 2.4 (H) 06/14/2022   MSPIKE Not Observed 06/14/2022   SPEI Comment 06/14/2022   Lab Results  Component Value Date   TIBC 227 (L) 06/10/2022   FERRITIN 762 (H) 06/10/2022   IRONPCTSAT 12 06/10/2022   Lab Results  Component Value Date   LDH 192 06/09/2022    STUDIES:  MR Brain W Wo Contrast  Result Date: 06/24/2022 CLINICAL DATA:  Follow-up  brain metastasis. EXAM: MRI HEAD WITHOUT AND WITH CONTRAST TECHNIQUE: Multiplanar, multiecho pulse sequences of the brain and surrounding structures were obtained without and with intravenous contrast. CONTRAST:  65m GADAVIST GADOBUTROL 1 MMOL/ML IV SOLN COMPARISON:  MRI head 03/30/2022 FINDINGS: Brain: Right suboccipital craniotomy for tumor resection in the cerebellum. Mild enhancement in the resection site is stable. Mild adjacent FLAIR hyperintensity is stable. Mild chronic blood products at the resection site stable. No recurrent tumor. No other enhancing lesions identified. Hyperintensities in the white matter bilaterally are stable. No acute infarct. Negative for hydrocephalus. Vascular: Normal arterial flow voids. Skull and upper cervical spine: Bone marrow is diffusely  low signal on T1 which has progressed. This may be treatment related. No focal bone lesion. Sinuses/Orbits: Paranasal sinuses clear.  Negative orbit Other: None IMPRESSION: Stable postsurgical changes right cerebellum without recurrent local tumor. No other areas of metastatic disease Stable white matter changes consistent with chronic microvascular ischemia. Electronically Signed   By: CFranchot GalloM.D.   On: 06/24/2022 14:36      HISTORY:   Past Medical History:  Diagnosis Date   [place]ophilia ([facility]    Anemia of chronic disease    Aortic atherosclerosis (HCC)    Atherosclerosis of native coronary artery of native heart with angina pectoris (HCC)    BMI 34.0-34.9,adult    Body mass index (BMI) 35.0-35.9, adult 09/29/2020   Cancer ([facility]    endometrial   Cancer, metastatic to lung ([place]    Endometrial adenocarcinoma ([facility] 11/03/2016   Endometrioid adenocarcinoma of uterus ([facility]    History of radiation therapy 03/02/17-03/16/17   vaginal cuff treated to 18 Gy with 3 fractions of 6 Gy   Hyperglycemia    Labile blood pressure    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[facility] 09/19/2020   Morbid obesity ([facility]    Multiple lung nodules on CT 03/14/2018   CT 11/15/16  MPN's largest 6 mm - PET 12/15/16 neg but largest < 668m- CT chest 01/31/18  Largest ?RML proximal/central  @ 1.8 x 1.5 and LLL peripheral cavitary x  1.5 x1.4  - PET 03/22/18 :  Two distinct nodules, def growing since 01/31/18 both hypermetabolic    Phlebitis alone left leg  4-5 yrs ago   Pulmonary embolism on right (HCC)    Redness and swelling of lower leg    Secondary malignant neoplasm of lung ([facility]9/07/2019   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) ([facility]/13/2019    Past Surgical History:  Procedure Laterality Date   APPLICATION OF CRANIAL NAVIGATION N/A 10/08/2020   Procedure: APPLICATION OF CRANIAL NAVIGATION;  Surgeon: ThVallarie MareMD;  [facility] Service:  Neurosurgery;  Laterality: N/A;   CRANIOTOMY N/A 10/08/2020   Procedure: CRANIOTOMY - Suboccipital TUMOR EXCISION - Brain Lab;  Surgeon: ThVallarie MareMD;  [place] Service: Neurosurgery;  Laterality: N/A;  suboccipital   ROBOTIC ASSISTED TOTAL HYSTERECTOMY WITH BILATERAL SALPINGO OOPHERECTOMY N/A 11/18/2016   Procedure: XI ROBOTIC ASSISTED TOTAL HYSTERECTOMY WITH BILATERAL SALPINGO OOPHORECTOMY;  Surgeon: EmEveritt AmberMD;  Location: [facility];  Service: Gynecology;  Laterality: N/A;   SENTINEL NODE BIOPSY N/A 11/18/2016   Procedure: SENTINEL NODE BIOPSY;  Surgeon: EmEveritt AmberMD;  Location: [facility];  Service: Gynecology;  Laterality: N/A;    Family History  Problem Relation Age of Onset   Atrial fibrillation Mother    Lung cancer Father    Breast cancer Sister    Heart failure  Sister     Social History:  reports that she has never smoked. She has never used smokeless tobacco. She reports that she does not drink alcohol and does not use drugs.The patient is accompanied by her sister today.  Allergies: No Known Allergies  Current Medications: Current Outpatient Medications  Medication Sig Dispense Refill   acetaminophen (TYLENOL) 500 MG tablet Take 500 mg by mouth every 6 (six) hours as needed for pain or headache.     ampicillin (PRINCIPEN) 500 MG capsule Take 1 capsule (500 mg total) by mouth 4 (four) times daily. 40 capsule 0   Calcium Carbonate (CALTRATE 600 PO) Take 1 tablet by mouth in the morning and at bedtime.     HYDROcodone-acetaminophen (NORCO/VICODIN) 5-325 MG tablet Take 1 tablet by mouth every 4 (four) hours as needed.     metoprolol succinate (TOPROL-XL) 25 MG 24 hr tablet Take 1 tablet (25 mg total) by mouth 2 (two) times daily. 180 tablet 3   Multiple Vitamin (MULTIVITAMIN WITH MINERALS) TABS tablet Take 1 tablet by mouth daily. Centrum Silver     ondansetron (ZOFRAN) 4 MG tablet Take 1 tablet (4 mg total) by mouth every 4 (four) hours as needed for nausea. 90  tablet 3   potassium chloride SA (KLOR-CON M) 20 MEQ tablet Take 40 mEq by mouth 2 (two) times daily. Takes 5 tablets daily while taking Torsemide     Probiotic Product (PROBIOTIC PO) Take 1 capsule by mouth daily.     prochlorperazine (COMPAZINE) 10 MG tablet Take 1 tablet (10 mg total) by mouth every 6 (six) hours as needed for nausea or vomiting. 90 tablet 3   No current facility-administered medications for this visit.

## 2022-07-23 ENCOUNTER — Inpatient Hospital Stay: Payer: Medicare Other

## 2022-07-23 ENCOUNTER — Ambulatory Visit: Payer: Medicare Other | Admitting: Gastroenterology

## 2022-07-23 DIAGNOSIS — D469 Myelodysplastic syndrome, unspecified: Secondary | ICD-10-CM

## 2022-07-23 LAB — PREPARE RBC (CROSSMATCH)

## 2022-07-23 MED ORDER — HEPARIN SOD (PORK) LOCK FLUSH 100 UNIT/ML IV SOLN
250.0000 [IU] | INTRAVENOUS | Status: AC | PRN
  Administered 2022-07-23: 500 [IU]

## 2022-07-23 MED ORDER — ACETAMINOPHEN 325 MG PO TABS
650.0000 mg | ORAL_TABLET | Freq: Once | ORAL | Status: AC
  Administered 2022-07-23: 650 mg via ORAL
  Filled 2022-07-23: qty 2

## 2022-07-23 MED ORDER — SODIUM CHLORIDE 0.9% IV SOLUTION
250.0000 mL | Freq: Once | INTRAVENOUS | Status: AC
  Administered 2022-07-23: 250 mL via INTRAVENOUS

## 2022-07-23 MED ORDER — SODIUM CHLORIDE 0.9% FLUSH
10.0000 mL | INTRAVENOUS | Status: AC | PRN
  Administered 2022-07-23: 10 mL

## 2022-07-23 MED ORDER — DIPHENHYDRAMINE HCL 25 MG PO CAPS
25.0000 mg | ORAL_CAPSULE | Freq: Once | ORAL | Status: AC
  Administered 2022-07-23: 25 mg via ORAL
  Filled 2022-07-23: qty 1

## 2022-07-23 NOTE — Patient Instructions (Signed)
Blood Transfusion, Adult, Care After The following information offers guidance on how to care for yourself after your procedure. Your health care provider may also give you more specific instructions. If you have problems or questions, contact your health care provider. What can I expect after the procedure? After the procedure, it is common to have: Bruising and soreness where the IV was inserted. A headache. Follow these instructions at home: IV insertion site care     Follow instructions from your health care provider about how to take care of your IV insertion site. Make sure you: Wash your hands with soap and water for at least 20 seconds before and after you change your bandage (dressing). If soap and water are not available, use hand sanitizer. Change your dressing as told by your health care provider. Check your IV insertion site every day for signs of infection. Check for: Redness, swelling, or pain. Bleeding from the site. Warmth. Pus or a bad smell. General instructions Take over-the-counter and prescription medicines only as told by your health care provider. Rest as told by your health care provider. Return to your normal activities as told by your health care provider. Keep all follow-up visits. Lab tests may need to be done at certain periods to recheck your blood counts. Contact a health care provider if: You have itching or red, swollen areas of skin (hives). You have a fever or chills. You have pain in the head, back, or chest. You feel anxious or you feel weak after doing your normal activities. You have redness, swelling, warmth, or pain around the IV insertion site. You have blood coming from the IV insertion site that does not stop with pressure. You have pus or a bad smell coming from your IV insertion site. If you received your blood transfusion in an outpatient setting, you will be told whom to contact to report any reactions. Get help right away if: You  have symptoms of a serious allergic or immune system reaction, including: Trouble breathing or shortness of breath. Swelling of the face, feeling flushed, or widespread rash. Dark urine or blood in the urine. Fast heartbeat. These symptoms may be an emergency. Get help right away. Call 911. Do not wait to see if the symptoms will go away. Do not drive yourself to the hospital. Summary Bruising and soreness around the IV insertion site are common. Check your IV insertion site every day for signs of infection. Rest as told by your health care provider. Return to your normal activities as told by your health care provider. Get help right away for symptoms of a serious allergic or immune system reaction to the blood transfusion. This information is not intended to replace advice given to you by your health care provider. Make sure you discuss any questions you have with your health care provider. Document Revised: 01/22/2022 Document Reviewed: 01/22/2022 Elsevier Patient Education  2023 Elsevier Inc.  

## 2022-07-24 LAB — TYPE AND SCREEN
ABO/RH(D): O POS
Antibody Screen: NEGATIVE
Unit division: 0

## 2022-07-24 LAB — BPAM RBC
Blood Product Expiration Date: 202310182359
ISSUE DATE / TIME: 202309150902
Unit Type and Rh: 5100

## 2022-07-26 ENCOUNTER — Telehealth: Payer: Self-pay

## 2022-07-26 ENCOUNTER — Other Ambulatory Visit: Payer: Self-pay | Admitting: Oncology

## 2022-07-26 DIAGNOSIS — N390 Urinary tract infection, site not specified: Secondary | ICD-10-CM

## 2022-07-26 NOTE — Telephone Encounter (Signed)
Appt with Dr. Hinton Rao on Wednesday 07/28/22.  Bringing patient in on 07/27/2022 to get labs and a urinalysis.  Triage patient and if necessary she can see Zachery Dauer., PA per Dr. Hinton Rao.  Labs scheduled on 07/27/22 @ 9:00am.  Ava aware.

## 2022-07-27 ENCOUNTER — Other Ambulatory Visit: Payer: Medicare Other

## 2022-07-28 ENCOUNTER — Inpatient Hospital Stay (INDEPENDENT_AMBULATORY_CARE_PROVIDER_SITE_OTHER): Payer: Medicare Other | Admitting: Oncology

## 2022-07-28 ENCOUNTER — Encounter: Payer: Self-pay | Admitting: Oncology

## 2022-07-28 ENCOUNTER — Other Ambulatory Visit: Payer: Medicare Other

## 2022-07-28 ENCOUNTER — Other Ambulatory Visit: Payer: Self-pay | Admitting: Oncology

## 2022-07-28 VITALS — BP 139/76 | HR 143 | Temp 97.7°F | Resp 19 | Ht 66.0 in | Wt 190.5 lb

## 2022-07-28 DIAGNOSIS — C7931 Secondary malignant neoplasm of brain: Secondary | ICD-10-CM | POA: Diagnosis not present

## 2022-07-28 DIAGNOSIS — C7801 Secondary malignant neoplasm of right lung: Secondary | ICD-10-CM

## 2022-07-28 DIAGNOSIS — C541 Malignant neoplasm of endometrium: Secondary | ICD-10-CM | POA: Diagnosis not present

## 2022-07-28 DIAGNOSIS — D469 Myelodysplastic syndrome, unspecified: Secondary | ICD-10-CM

## 2022-07-28 DIAGNOSIS — D696 Thrombocytopenia, unspecified: Secondary | ICD-10-CM

## 2022-07-28 MED ORDER — HYDROCODONE-ACETAMINOPHEN 5-325 MG PO TABS: 1.0000 | ORAL_TABLET | ORAL | 0 refills | Status: DC | PRN

## 2022-07-28 MED ORDER — FENTANYL 12 MCG/HR TD PT72: 1.0000 | MEDICATED_PATCH | TRANSDERMAL | 0 refills | Status: DC

## 2022-07-28 MED ORDER — PROMETHAZINE-DM 6.25-15 MG/5ML PO SYRP: 5.0000 mL | ORAL_SOLUTION | Freq: Four times a day (QID) | ORAL | 0 refills | Status: DC | PRN

## 2022-07-28 NOTE — Progress Notes (Signed)
[facility]  [address] [place],  [place]  [id number] [phone]  Clinic Day:  07/28/22  Referring physician: Venetia Maxon, [name], *  ASSESSMENT & PLAN:   Assessment & Plan: Myelodysplasia (myelodysplastic syndrome) ([place]) Newly diagnosed myelodysplasia-excess blasts.  She has associated anemia with excess blasts, and is requiring regular transfusion.  Her hemoglobin has drifted down from 9.1 to 8.1, so I will arrange for her to have 1 unit of packed red blood cells as an outpatient tomorrow.  We have discussed with treatment of her anemia with epoetin injections, but the patient and her sister are uncertain due to the increased risk of blood clots.  We have also discussed treatment of her myelodysplasia with azacitidine, as well as potential side effects.  She is not keen on taking any therapy.  They are waiting on a second opinion at [facility].  We will provide supportive care at this time.  Worsening thrombocytopenia Her platelets are falling faster now, from 74,000 to 54,000 in the last week.   UTI (urinary tract infection) due to Enterococcus Urine culture from 2 weeks ago revealed greater than 100,000 colonies of Enterococcus faecalis. This is a different organism from the UTI she had last month. She has been started on ampicillin 500 mg 4 times daily and was tolerating this without difficulty.  She was told by the ER to stop it since her urine was clear when they rechecked.  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  She continues to follow with Dr. Mickeal Skinner of Neuro-Oncology.  MRI on August 17 revealed stable post-surgical and post-treatment appearance of the right cerebellar hemisphere. No new intracranial metastatic lesion is identified.  Stable white matter  changes consistent with chronic microvascular ischemia seen.   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  She had progression of her disease in August 2020. As her tumor was MSI-high, she was placed on palliative pembrolizumab. Unfortunately, she had progressive disease after 6 cycles of pembrolizumab.  She was therefore placed on palliative carboplatin/paclitaxel/bevacizumb. Bevacizumab was discontinued due to persistent vaginal bleeding/hematuria and epistaxis.  She completed 6 cycles of carboplatin/paclitaxel in May 2020. CT imaging April 2023 remained stable.  There was no definite evidence of recurrence on CT imaging on August 4.     Endometrial adenocarcinoma ([place]) History of stage IB high-grade endometrial carcinoma treated with hysterectomy/bilateral salpingo-oophorectomy, followed by adjuvant radiation and brachytherapy in 2018.   Pain of hips and lower back The says this hurts more with ambulation and is less when she is sitting. This includes her lower extremities and onset has been since the diagnosis of her MDS. The hydrocodone only gives her partial relief. I will add fentanyl patch at lowest dose of 12.5 mcg.    I will try her on fentanyl patch of 12.5 mcg and she can continue to use the hydrocodone  5 mg as needed for breakthrough pain. I will refill promethazine DM 5 ml prn cough as requested. She can stop the antibiotic and I will plan to recheck her urine next week since we have documented UTI's with 2 different organisms in the last month. She still has occasional fevers. We have discussed her diagnosis of MDS again and the fact that she is  not likely to tolerate chemotherapy. We will continue to provide supportive care but they are waiting on a consultation appointment with [facility]. The patient  understands the plans discussed today and is in agreement with them.  She knows to contact our office if she develops concerns prior to her next appointment.   I provided 15 minutes of face-to-face time during this encounter and > 50% was spent counseling as documented under my assessment and plan.    Derwood Kaplan, MD  [facility] [address] [place] [address] Dept: [phone] Dept Fax: [phone]   No orders of the defined types were placed in this encounter.     CHIEF COMPLAINT:  CC: Myelodysplasia with excess blasts  Current Treatment: Transfusions as needed  HISTORY OF PRESENT ILLNESS:   Oncology History  Endometrial adenocarcinoma ([place])  10/19/2016 Cancer Staging   Staging form: Corpus Uteri - Carcinoma, AJCC 7th Edition - Clinical stage from 10/19/2016: FIGO Stage IB (T1b, N0, M0) - Signed by Derwood Kaplan, MD on 09/14/2021 Staged by: Managing physician Diagnostic confirmation: Positive histology Specimen type: Excision Histopathologic type: Adenocarcinoma, endocervical type Stage prefix: Initial diagnosis Laterality: Bilateral Tumor size (mm): 40 Histologic grade (G): G3 Lymph-vascular invasion (LVI): LVI present/identified, NOS Residual tumor (R): R0 - None Peritoneal cytology results: Negative Pelvic nodal status: Negative Para-aortic status: Negative Stage used in treatment planning: Yes National guidelines used in treatment planning: Yes Type of national guideline used in treatment planning: NCCN   11/03/2016 Initial Diagnosis   Endometrial adenocarcinoma ([place])   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[place])  09/19/2020 Initial Diagnosis   Brain metastasis ([place])       INTERVAL HISTORY:  Brianna Herring is here today for repeat clinical assessment and states she has been doing poorly, with still occasional fevers Her urinary tract infection of Enterococcus has been treated  with ampicillin and recheck of the urine in the ER was negative.  She went there for tachycardia and palpitations, and also noted increased pain and nausea.  She denies chills.  She denies suprapubic pain, dysuria or frequency of urination.  She remains fatigued.  She has occasional nausea without vomiting, for which medication is effective.  She has a slight cough occasionally productive of white sputum, and is using promethazine DM, and requests a refill. Hydrocodone is giving her partial relief of her pain, which she describes of her lower back and hips, especially when she tried to ambulate.  She denies dyspnea or chest pain.  She denies pain. Her appetite is poor. Her weight is down 3 pounds. While in the ER, she had a CT Angiogram which was negative for pulmonary emboli or any acute changes. She has tiny stable pulmonary nodules. She also had CT of abdomen and pelvis, which was unremarkable except for hepatomegaly.  Labs reveal normal WBC's, hemoglobin 8.1 and platelets decreased from 74,000 to 54,000.   REVIEW OF SYSTEMS:  Review of Systems  Constitutional:  Positive for fatigue. Negative for appetite change, chills, fever and unexpected weight change.  HENT:   Negative for lump/mass, mouth sores and sore throat.   Respiratory:  Negative for cough and shortness of breath.   Cardiovascular:  Negative for chest pain and leg swelling.  Gastrointestinal:  Positive for nausea. Negative for abdominal pain, constipation, diarrhea and vomiting.  Endocrine: Negative for hot flashes.  Genitourinary:  Negative for difficulty urinating, dysuria, frequency and hematuria.   Musculoskeletal:  Negative for arthralgias,  back pain and myalgias.  Skin:  Negative for rash.  Neurological:  Negative for dizziness and headaches.  Hematological:  Negative for adenopathy. Does not bruise/bleed easily.  Psychiatric/Behavioral:  Negative for depression and sleep disturbance. The patient is not nervous/anxious.       VITALS:  Blood pressure 139/76, pulse (!) 143, temperature 97.7 F (36.5 C), temperature source Oral, resp. rate 19, height '5\' 6"'  (1.676 m), weight 190 lb 8 oz (86.4 kg), SpO2 97 %.  Wt Readings from Last 3 Encounters:  08/12/22 182 lb 11.2 oz (82.9 kg)  07/28/22 190 lb 8 oz (86.4 kg)  07/23/22 193 lb (87.5 kg)    Body mass index is 30.75 kg/m.  Performance status (ECOG): 2 - Symptomatic, <50% confined to bed  PHYSICAL EXAM:  Physical Exam Vitals and nursing note reviewed.  Constitutional:      General: She is not in acute distress.    Appearance: Normal appearance.  HENT:     Head: Normocephalic and atraumatic.     Mouth/Throat:     Mouth: Mucous membranes are moist.     Pharynx: Oropharynx is clear. No oropharyngeal exudate or posterior oropharyngeal erythema.  Eyes:     General: No scleral icterus.    Extraocular Movements: Extraocular movements intact.     Conjunctiva/sclera: Conjunctivae normal.     Pupils: Pupils are equal, round, and reactive to light.  Cardiovascular:     Rate and Rhythm: Normal rate and regular rhythm.     Heart sounds: Normal heart sounds. No murmur heard.    No friction rub. No gallop.  Pulmonary:     Effort: Pulmonary effort is normal.     Breath sounds: Normal breath sounds. No wheezing, rhonchi or rales.  Abdominal:     General: There is no distension.     Palpations: Abdomen is soft. There is no hepatomegaly, splenomegaly or mass.     Tenderness: There is no abdominal tenderness.  Musculoskeletal:        General: Normal range of motion.     Cervical back: Normal range of motion and neck supple. No tenderness.     Right lower leg: No edema.     Left lower leg: No edema.  Lymphadenopathy:     Cervical: No cervical adenopathy.     Upper Body:     Right upper body: No supraclavicular or axillary adenopathy.     Left upper body: No supraclavicular or axillary adenopathy.  Skin:    General: Skin is warm and dry.     Coloration: Skin is  not jaundiced.     Findings: No rash.  Neurological:     Mental Status: She is alert and oriented to person, place, and time.     Cranial Nerves: No cranial nerve deficit.  Psychiatric:        Mood and Affect: Mood normal.        Behavior: Behavior normal.        Thought Content: Thought content normal.    LABS:      Latest Ref Rng & Units 08/12/2022   12:00 AM 07/22/2022   12:00 AM 07/14/2022   12:00 AM  CBC  WBC  6.6     5.6     4.5      Hemoglobin 12.0 - 16.0 9.0     8.1     9.1      Hematocrit 36 - 46 27     25     28  Platelets 150 - 400 K/uL 23     54     74         This result is from an external source.      Latest Ref Rng & Units 08/12/2022   12:00 AM 07/22/2022   12:00 AM 07/14/2022   12:00 AM  CMP  BUN 4 - '21 16     9     9      ' Creatinine 0.5 - 1.1 0.4     0.4     0.5      Sodium 137 - 147 133     134     137      Potassium 3.5 - 5.1 mEq/L 3.5     3.7     3.6      Chloride 99 - 108 98     98     97      CO2 13 - '22 27     28     29      ' Calcium 8.7 - 10.7 8.9     8.6     9.2      Alkaline Phos 25 - 125 93     90     95      AST 13 - 35 22     32     23      ALT 7 - 35 U/L '21     18     23         ' This result is from an external source.     No results found for: "CEA1", "CEA" / No results found for: "CEA1", "CEA" No results found for: "PSA1" No results found for: "CAN199" Lab Results  Component Value Date   CAN125 7.2 12/10/2021    Lab Results  Component Value Date   TOTALPROTELP 7.8 06/14/2022   ALBUMINELP 2.5 (L) 06/14/2022   A1GS 0.5 (H) 06/14/2022   A2GS 1.3 (H) 06/14/2022   BETS 1.0 06/14/2022   GAMS 2.4 (H) 06/14/2022   MSPIKE Not Observed 06/14/2022   SPEI Comment 06/14/2022   Lab Results  Component Value Date   TIBC 227 (L) 06/10/2022   FERRITIN 762 (H) 06/10/2022   IRONPCTSAT 12 06/10/2022   Lab Results  Component Value Date   LDH 192 06/09/2022    STUDIES:  No results found.    HISTORY:   Past Medical History:   Diagnosis Date   Acquired thrombophilia ([place])    Anemia of chronic disease    Aortic atherosclerosis ([place])    Atherosclerosis of native coronary artery of native heart with angina pectoris (HCC)    BMI 34.0-34.9,adult    Body mass index (BMI) 35.0-35.9, adult 09/29/2020   Cancer [facility])    endometrial   Cancer, metastatic to lung [facility])    Endometrial adenocarcinoma ([place]) 11/03/2016   Endometrioid adenocarcinoma of uterus ([place])    History of radiation therapy 03/02/17-03/16/17   vaginal cuff treated to 18 Gy with 3 fractions of 6 Gy   Hyperglycemia    Labile blood pressure    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[place]) 09/19/2020   Morbid obesity ([place])    Multiple lung nodules on CT 03/14/2018   CT 11/15/16  MPN's largest 6 mm - PET 12/15/16 neg but largest < 18m - CT chest 01/31/18  Largest ?RML proximal/central  @ 1.8 x 1.5 and LLL peripheral cavitary x  1.5 x1.4  - PET 03/22/18 :  Two  distinct nodules, def growing since 01/31/18 both hypermetabolic    Phlebitis alone left leg  4-5 yrs ago   Pulmonary embolism on right (HCC)    Redness and swelling of lower leg    Secondary malignant neoplasm of lung ([place]) 07/18/2019   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) ([place]) 01/18/2018    Past Surgical History:  Procedure Laterality Date   APPLICATION OF CRANIAL NAVIGATION N/A 10/08/2020   Procedure: APPLICATION OF CRANIAL NAVIGATION;  Surgeon: Vallarie Mare, MD;  Location: [facility];  Service: Neurosurgery;  Laterality: N/A;   CRANIOTOMY N/A 10/08/2020   Procedure: CRANIOTOMY - Suboccipital TUMOR EXCISION - Brain Lab;  Surgeon: Vallarie Mare, MD;  Location: [facility];  Service: Neurosurgery;  Laterality: N/A;  suboccipital   ROBOTIC ASSISTED TOTAL HYSTERECTOMY WITH BILATERAL SALPINGO OOPHERECTOMY N/A 11/18/2016   Procedure: XI ROBOTIC ASSISTED TOTAL HYSTERECTOMY WITH BILATERAL SALPINGO OOPHORECTOMY;  Surgeon: Everitt Amber, MD;  Location: [facility];  Service: Gynecology;   Laterality: N/A;   SENTINEL NODE BIOPSY N/A 11/18/2016   Procedure: SENTINEL NODE BIOPSY;  Surgeon: Everitt Amber, MD;  Location: [facility];  Service: Gynecology;  Laterality: N/A;    Family History  Problem Relation Age of Onset   Atrial fibrillation Mother    Lung cancer Father    Breast cancer Sister    Heart failure Sister     Social History:  reports that she has never smoked. She has never used smokeless tobacco. She reports that she does not drink alcohol and does not use drugs.The patient is accompanied by her sister today.  Allergies: No Known Allergies  Current Medications: Current Outpatient Medications  Medication Sig Dispense Refill   acetaminophen (TYLENOL) 500 MG tablet Take 500 mg by mouth every 6 (six) hours as needed for pain or headache.     amiodarone (PACERONE) 200 MG tablet Take 200 mg by mouth 2 (two) times daily.     Calcium Carbonate (CALTRATE 600 PO) Take 1 tablet by mouth in the morning and at bedtime.     cephALEXin (KEFLEX) 500 MG capsule Take 1 capsule (500 mg total) by mouth 3 (three) times daily for 10 days. 30 capsule 0   fentaNYL (DURAGESIC) 12 MCG/HR Place 1 patch onto the skin every 3 (three) days. 5 patch 0   HYDROcodone-acetaminophen (NORCO/VICODIN) 5-325 MG tablet Take 1 tablet by mouth every 4 (four) hours as needed. 100 tablet 0   LANTUS SOLOSTAR 100 UNIT/ML Solostar Pen SMARTSIG:10 Unit(s) SUB-Q Daily     LORazepam (ATIVAN) 0.5 MG tablet Take 1 tablet (0.5 mg total) by mouth every 6 (six) hours as needed for anxiety. 30 tablet 0   metoprolol succinate (TOPROL-XL) 25 MG 24 hr tablet Take 1 tablet (25 mg total) by mouth 2 (two) times daily. 180 tablet 3   Multiple Vitamin (MULTIVITAMIN WITH MINERALS) TABS tablet Take 1 tablet by mouth daily. Centrum Silver     ondansetron (ZOFRAN) 4 MG tablet Take 1 tablet (4 mg total) by mouth every 4 (four) hours as needed for nausea. 90 tablet 3   potassium chloride 20 MEQ/15ML (10%) SOLN Take 20 mEq by mouth daily.      Probiotic Product (PROBIOTIC PO) Take 1 capsule by mouth daily.     prochlorperazine (COMPAZINE) 10 MG tablet Take 1 tablet (10 mg total) by mouth every 6 (six) hours as needed for nausea or vomiting. 90 tablet 3   promethazine-dextromethorphan (PROMETHAZINE-DM) 6.25-15 MG/5ML syrup Take 5 mLs by mouth 4 (four) times  daily as needed for cough. 118 mL 0   No current facility-administered medications for this visit.

## 2022-07-29 ENCOUNTER — Ambulatory Visit: Payer: Medicare Other

## 2022-07-30 ENCOUNTER — Telehealth: Payer: Self-pay

## 2022-07-30 ENCOUNTER — Other Ambulatory Visit: Payer: Self-pay | Admitting: Oncology

## 2022-07-30 DIAGNOSIS — D469 Myelodysplastic syndrome, unspecified: Secondary | ICD-10-CM

## 2022-07-30 MED ORDER — LORAZEPAM 0.5 MG PO TABS: 0.5000 mg | ORAL_TABLET | Freq: Four times a day (QID) | ORAL | 0 refills | Status: AC | PRN

## 2022-07-30 NOTE — Telephone Encounter (Signed)
07/30/2022 - Dr. Hinton Rao : I can order some Ativan, will go with 0.5 mg  07/29/22 Ava has called req something for anxiety for Rashon.  She states that Lailana is having an anxiety attack and her BP and pulse is going up. Message sent to Dr Hinton Rao.

## 2022-08-02 ENCOUNTER — Telehealth: Payer: Self-pay

## 2022-08-02 NOTE — Telephone Encounter (Signed)
-----   Message from Derwood Kaplan, MD sent at 08/02/2022  2:57 PM EDT ----- Regarding: RE: disability Letter done, they asked at lunchtime and I told them I would do this afternoon ----- Message ----- From: Georgette Shell, RN Sent: 07/26/2022   1:45 PM EDT To: Derwood Kaplan, MD; Belva Chimes, LPN Subject: disability                                     Tiffany from Ms. [employer]cci's disability claim office called.  They are needing a work status letter faxed to [phone].  Tiffany's number [phone].

## 2022-08-02 NOTE — Telephone Encounter (Signed)
Letter faxed to [place] at [phone]

## 2022-08-02 NOTE — Telephone Encounter (Signed)
-----   Message from Georgette Shell, RN sent at 07/26/2022  1:43 PM EDT ----- Regarding: disability Tiffany from Ms[employer]'s disability claim office called.  They are needing a work status letter faxed to [phone].  Tiffany's number [phone].

## 2022-08-02 NOTE — Telephone Encounter (Signed)
Faxed letter head letter to [place] at [phone].

## 2022-08-03 DIAGNOSIS — C7931 Secondary malignant neoplasm of brain: Secondary | ICD-10-CM

## 2022-08-03 DIAGNOSIS — C541 Malignant neoplasm of endometrium: Secondary | ICD-10-CM

## 2022-08-03 DIAGNOSIS — I4892 Unspecified atrial flutter: Secondary | ICD-10-CM | POA: Diagnosis not present

## 2022-08-03 DIAGNOSIS — C7801 Secondary malignant neoplasm of right lung: Secondary | ICD-10-CM

## 2022-08-03 DIAGNOSIS — I4891 Unspecified atrial fibrillation: Secondary | ICD-10-CM | POA: Diagnosis not present

## 2022-08-04 ENCOUNTER — Ambulatory Visit: Payer: Medicare Other | Admitting: Hematology and Oncology

## 2022-08-04 ENCOUNTER — Inpatient Hospital Stay: Payer: Medicare Other

## 2022-08-04 DIAGNOSIS — D649 Anemia, unspecified: Secondary | ICD-10-CM | POA: Diagnosis not present

## 2022-08-04 DIAGNOSIS — C7931 Secondary malignant neoplasm of brain: Secondary | ICD-10-CM | POA: Diagnosis not present

## 2022-08-04 DIAGNOSIS — I4892 Unspecified atrial flutter: Secondary | ICD-10-CM | POA: Diagnosis not present

## 2022-08-04 DIAGNOSIS — I340 Nonrheumatic mitral (valve) insufficiency: Secondary | ICD-10-CM | POA: Diagnosis not present

## 2022-08-04 DIAGNOSIS — C541 Malignant neoplasm of endometrium: Secondary | ICD-10-CM | POA: Diagnosis not present

## 2022-08-04 DIAGNOSIS — C7801 Secondary malignant neoplasm of right lung: Secondary | ICD-10-CM | POA: Diagnosis not present

## 2022-08-04 DIAGNOSIS — I361 Nonrheumatic tricuspid (valve) insufficiency: Secondary | ICD-10-CM | POA: Diagnosis not present

## 2022-08-05 DIAGNOSIS — C541 Malignant neoplasm of endometrium: Secondary | ICD-10-CM | POA: Diagnosis not present

## 2022-08-05 DIAGNOSIS — I4892 Unspecified atrial flutter: Secondary | ICD-10-CM | POA: Diagnosis not present

## 2022-08-05 DIAGNOSIS — D649 Anemia, unspecified: Secondary | ICD-10-CM | POA: Diagnosis not present

## 2022-08-05 DIAGNOSIS — C7801 Secondary malignant neoplasm of right lung: Secondary | ICD-10-CM | POA: Diagnosis not present

## 2022-08-05 DIAGNOSIS — C7931 Secondary malignant neoplasm of brain: Secondary | ICD-10-CM | POA: Diagnosis not present

## 2022-08-05 NOTE — Progress Notes (Signed)
[facility]  [address] [place],  [place]  [address] [phone]  Clinic Day:  08/12/2022  Referring physician: Street, [name], *  ASSESSMENT & PLAN:   Assessment & Plan: Myelodysplasia (myelodysplastic syndrome) (Brianna Herring) Newly diagnosed myelodysplasia-excess blasts.  She has associated anemia requiring regular transfusion.  Her hemoglobin has drifted down from 9.1 to 8.1, so I will arrange for her to have 1 unit of packed red blood cells as an outpatient tomorrow.  We have discussed with treatment of her anemia with epoetin injections, but the patient and her sister are uncertain due to the increased risk of blood clots.  We have also discussed treatment of her myelodysplasia with azacitidine, as well as potential side effects.  She is not keen on taking any therapy.  They are waiting on a second opinion at [place].  We will plan to see her back in 1 week for continued supportive care.   UTI (urinary tract infection) due to Enterococcus Urine culture from last week revealed greater than 100,000 colonies of Enterococcus faecalis.  She has been started on ampicillin 500 mg 4 times daily and is tolerating this without difficulty.  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  She continues to follow with Dr. Mickeal Skinner of Neuro-Oncology.  MRI on August 17 revealed stable post-surgical and post-treatment appearance of the right cerebellar hemisphere. No new intracranial metastatic lesion is identified.  Stable white matter changes consistent with chronic microvascular ischemia seen.   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  She had progression of her disease in August 2020. As her tumor was MSI-high, she was placed on palliative pembrolizumab. Unfortunately, she had progressive disease after 6 cycles of pembrolizumab.  She was therefore placed on palliative carboplatin/paclitaxel/bevacizumb. Bevacizumab was discontinued due to persistent vaginal bleeding/hematuria and epistaxis.  She completed 6 cycles of carboplatin/paclitaxel in May 2020. CT imaging April 2023 remained stable.  There was no definite evidence of recurrence on CT imaging on August 4.  Stable sub-5 mm pulmonary nodules in the right lung were identified.   Endometrial adenocarcinoma ([place]) History of stage IB high-grade endometrial carcinoma treated with hysterectomy/bilateral salpingo-oophorectomy, followed by adjuvant radiation and brachytherapy in 2018.       Plan:She was in the hospital recently (07/31/22) for dyspnea and bilateral pneumonia. This was quite severe and she spent most of the stay in ICU for 10 days. During that time she had worsening thrombocytopenia and had 1-5% blasts in the peripheral blood. She was found to have acute hypoxic respiratory failure. We will see her back in 1 week with CBC and CMP. The patient understands the plans discussed today and is in agreement with them.  She knows to contact our office if she develops concerns prior to her next appointment.   I provided 15 minutes of face-to-face time during this encounter and > 50% was spent counseling as documented under my assessment and plan.    Conard Novak  [facility] AT [facility] [address] [place] [address] Dept: [phone] Dept Fax: [phone]   No orders of the defined types were placed in this encounter.     CHIEF  COMPLAINT:  CC: Myelodysplasia with excess blasts  Current Treatment: Transfusions as needed  HISTORY  OF PRESENT ILLNESS:   Oncology History  Endometrial adenocarcinoma ([place])  10/19/2016 Cancer Staging   Staging form: Corpus Uteri - Carcinoma, AJCC 7th Edition - Clinical stage from 10/19/2016: FIGO Stage IB (T1b, N0, M0) - Signed by Derwood Kaplan, MD on 09/14/2021 Staged by: Managing physician Diagnostic confirmation: Positive histology Specimen type: Excision Histopathologic type: Adenocarcinoma, endocervical type Stage prefix: Initial diagnosis Laterality: Bilateral Tumor size (mm): 40 Histologic grade (G): G3 Lymph-vascular invasion (LVI): LVI present/identified, NOS Residual tumor (R): R0 - None Peritoneal cytology results: Negative Pelvic nodal status: Negative Para-aortic status: Negative Stage used in treatment planning: Yes National guidelines used in treatment planning: Yes Type of national guideline used in treatment planning: NCCN   11/03/2016 Initial Diagnosis   Endometrial adenocarcinoma ([place])   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[place])  09/19/2020 Initial Diagnosis   Brain metastasis ([place])       INTERVAL HISTORY:  Brianna Herring is here today for repeat clinical assessment and states she has been doing fairly well since being out of the hospital. She was in the hospital recently (07/31/22) for dyspnea and bilateral pneumonia. This was quite severe and she spent most of the stay in ICU for 10 days. During that time she had worsening thrombocytopenia and had 1-5% blasts in the peripheral blood. She was found to have acute hypoxic respiratory failure. She denies having trouble with breathing. She does take a few walks. White cells are normal. Hemoglobin is 9. Otherwise CBC and CMP  are unremarkable. We will order Keflex 5 mg TID. We will be increasing her potassium to BID. She denies continued fevers or chills.  She denies suprapubic pain, dysuria or frequency of urination she remains fatigued.  She denies dyspnea or chest pain.  She denies pain. Her appetite is good. She  has lost 8 pounds since her last visit.    REVIEW OF SYSTEMS:  Review of Systems  Constitutional:  Positive for fatigue. Negative for appetite change, chills, fever and unexpected weight change.  HENT:   Negative for lump/mass, mouth sores and sore throat.   Respiratory:  Negative for cough and shortness of breath.   Cardiovascular:  Negative for chest pain and leg swelling.  Gastrointestinal:  Positive for nausea. Negative for abdominal pain, constipation, diarrhea and vomiting.  Endocrine: Negative for hot flashes.  Genitourinary:  Negative for difficulty urinating, dysuria, frequency and hematuria.   Musculoskeletal:  Negative for arthralgias, back pain and myalgias.  Skin:  Negative for rash.  Neurological:  Negative for dizziness and headaches.  Hematological:  Negative for adenopathy. Does not bruise/bleed easily.  Psychiatric/Behavioral:  Negative for depression and sleep disturbance. The patient is not nervous/anxious.      VITALS:  There were no vitals taken for this visit.  Wt Readings from Last 3 Encounters:  07/28/22 190 lb 8 oz (86.4 kg)  07/23/22 193 lb (87.5 kg)  07/22/22 192 lb 11.2 oz (87.4 kg)    There is no height or weight on file to calculate BMI.  Performance status (ECOG): 2 - Symptomatic, <50% confined to bed  PHYSICAL EXAM:  Physical Exam Vitals and nursing note reviewed.  Constitutional:      General: She is not in acute distress.    Appearance: Normal appearance.  HENT:     Head: Normocephalic and atraumatic.     Mouth/Throat:     Mouth: Mucous membranes are moist.  Pharynx: Oropharynx is clear. No oropharyngeal exudate or posterior oropharyngeal erythema.  Eyes:     General: No scleral icterus.    Extraocular Movements: Extraocular movements intact.     Conjunctiva/sclera: Conjunctivae normal.     Pupils: Pupils are equal, round, and reactive to light.  Cardiovascular:     Rate and Rhythm: Regular rhythm. Tachycardia present.     Heart  sounds: Normal heart sounds. No murmur heard.    No friction rub. No gallop.  Pulmonary:     Effort: Pulmonary effort is normal.     Breath sounds: Normal breath sounds. No wheezing, rhonchi or rales.  Abdominal:     General: There is no distension.     Palpations: Abdomen is soft. There is no hepatomegaly, splenomegaly or mass.     Tenderness: There is no abdominal tenderness.  Musculoskeletal:        General: Normal range of motion.     Cervical back: Normal range of motion and neck supple. No tenderness.     Right lower leg: Edema present.     Left lower leg: Edema present.     Comments: A trace of edema  Lymphadenopathy:     Cervical: No cervical adenopathy.     Upper Body:     Right upper body: No supraclavicular or axillary adenopathy.     Left upper body: No supraclavicular or axillary adenopathy.  Skin:    General: Skin is warm and dry.     Coloration: Skin is not jaundiced.     Findings: No rash.     Comments: Phlebitis of right forearm vein is red and warm and swollen   Neurological:     Mental Status: She is alert and oriented to person, place, and time.     Cranial Nerves: No cranial nerve deficit.  Psychiatric:        Mood and Affect: Mood normal.        Behavior: Behavior normal.        Thought Content: Thought content normal.    LABS:      Latest Ref Rng & Units 07/22/2022   12:00 AM 07/14/2022   12:00 AM 07/07/2022   12:00 AM  CBC  WBC  5.6     4.5     4.9      Hemoglobin 12.0 - 16.0 8.1     9.1     8.2      Hematocrit 36 - 46 '25     28     25      ' Platelets 150 - 400 K/uL 54     74     113         This result is from an external source.      Latest Ref Rng & Units 07/22/2022   12:00 AM 07/14/2022   12:00 AM 07/07/2022   12:00 AM  CMP  BUN 4 - '21 9     9     9      ' Creatinine 0.5 - 1.1 0.4     0.5     0.5      Sodium 137 - 147 134     137     135      Potassium 3.5 - 5.1 mEq/L 3.7     3.6     3.8      Chloride 99 - 108 98     97     98      CO2 13 -  '22 28     29     28      ' Calcium 8.7 - 10.7 8.6     9.2     9.3      Alkaline Phos 25 - 125 90     95     94      AST 13 - 35 32     23     34      ALT 7 - 35 U/L '18     23     30         ' This result is from an external source.     No results found for: "CEA1", "CEA" / No results found for: "CEA1", "CEA" No results found for: "PSA1" No results found for: "CAN199" Lab Results  Component Value Date   CAN125 7.2 12/10/2021    Lab Results  Component Value Date   TOTALPROTELP 7.8 06/14/2022   ALBUMINELP 2.5 (L) 06/14/2022   A1GS 0.5 (H) 06/14/2022   A2GS 1.3 (H) 06/14/2022   BETS 1.0 06/14/2022   GAMS 2.4 (H) 06/14/2022   MSPIKE Not Observed 06/14/2022   SPEI Comment 06/14/2022   Lab Results  Component Value Date   TIBC 227 (L) 06/10/2022   FERRITIN 762 (H) 06/10/2022   IRONPCTSAT 12 06/10/2022   Lab Results  Component Value Date   LDH 192 06/09/2022    STUDIES:  No results found.  EXAM:06/24/22 MRI HEAD WITHOUT AND WITH CONTRAST TECHNIQUE: Multiplanar, multiecho pulse sequences of the brain and surrounding structures were obtained without and with intravenous contrast.   CONTRAST:  59m GADAVIST GADOBUTROL 1 MMOL/ML IV SOLN   COMPARISON:  MRI head 03/30/2022   FINDINGS: Brain: Right suboccipital craniotomy for tumor resection in the cerebellum. Mild enhancement in the resection site is stable. Mild adjacent FLAIR hyperintensity is stable. Mild chronic blood products at the resection site stable. No recurrent tumor.   No other enhancing lesions identified. Hyperintensities in the white matter bilaterally are stable. No acute infarct. Negative for hydrocephalus.   Vascular: Normal arterial flow voids.   Skull and upper cervical spine: Bone marrow is diffusely low signal on T1 which has progressed. This may be treatment related. No focal bone lesion.   Sinuses/Orbits: Paranasal sinuses clear.  Negative orbit   Other: None   IMPRESSION: Stable  postsurgical changes right cerebellum without recurrent local tumor. No other areas of metastatic disease   Stable white matter changes consistent with chronic microvascular ischemia.  EXAM:06/19/22 CHEST 2-VIEW COMPARISON:  Chest x-ray 06/16/2022   FINDINGS: Right chest port catheter tip projects over the SVC, unchanged. The heart size and mediastinal contours are within normal limits. Both lungs are clear. The visualized skeletal structures are unremarkable.   IMPRESSION: No active cardiopulmonary disease.    EXAM:04/14/22 CT ANGIOGRAPHY CHEST WITH CONTRAST IMPRESSION: No evidence for acute pulmonary embolus. Small right lung nodules are unchanged from previous exam. Small hiatal hernia. Aortic  Atherosclerosis. Coronary artery calcifications.    HISTORY:   Past Medical History:  Diagnosis Date   Acquired thrombophilia ([place]    Anemia of chronic disease    Aortic atherosclerosis ([facility]    Atherosclerosis of native coronary artery of native heart with angina pectoris (HCC)    BMI 34.0-34.9,adult    Body mass index (BMI) 35.0-35.9, adult 09/29/2020   Cancer ([facility]    endometrial   Cancer, metastatic to lung ([facility]    Endometrial adenocarcinoma ([place] 11/03/2016   Endometrioid adenocarcinoma of uterus ([facility]  History of radiation therapy 03/02/17-03/16/17   vaginal cuff treated to 18 Gy with 3 fractions of 6 Gy   Hyperglycemia    Labile blood pressure    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[place]) 09/19/2020   Morbid obesity ([place])    Multiple lung nodules on CT 03/14/2018   CT 11/15/16  MPN's largest 6 mm - PET 12/15/16 neg but largest < 80m - CT chest 01/31/18  Largest ?RML proximal/central  @ 1.8 x 1.5 and LLL peripheral cavitary x  1.5 x1.4  - PET 03/22/18 :  Two distinct nodules, def growing since 01/31/18 both hypermetabolic    Phlebitis alone left leg  4-5 yrs ago   Pulmonary embolism on right (HCC)    Redness and swelling of lower leg    [facility]/18[facility]   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) ([facility] 01/18/2018    Past Surgical History:  Procedure Laterality Date   APPLICATION OF CRANIAL NAVIGATION N/A 10/08/2020   Procedure: APPLICATION OF CRANIAL NAVIGATION;  Surgeon: TVallarie Mare MD;  [place] [facility]  Service: Neurosurgery;  Laterality: N/A;   CRANIOTOMY N/A 10/08/2020   Procedure: CRANIOTOMY - Suboccipital TUMOR EXCISION - Brain Lab;  Surgeon: TVallarie Mare MD;  [place] [facility]  Service: Neurosurgery;  Laterality: N/A;  suboccipital   ROBOTIC ASSISTED TOTAL HYSTERECTOMY WITH BILATERAL SALPINGO OOPHERECTOMY N/A 11/18/2016   Procedure: XI ROBOTIC ASSISTED TOTAL HYSTERECTOMY WITH BILATERAL SALPINGO OOPHORECTOMY;  Surgeon: EEveritt Amber MD;  Location: [facility];  Service: Gynecology;  Laterality: N/A;   SENTINEL NODE BIOPSY N/A 11/18/2016   Procedure: SENTINEL NODE BIOPSY;  Surgeon: EEveritt Amber MD;  Location: [facility];  Service: Gynecology;  Laterality: N/A;    Family History  Problem Relation Age of Onset   Atrial fibrillation Mother    Lung cancer Father    Breast cancer Sister    Heart failure Sister     Social History:  reports that she has never smoked. She has never used smokeless tobacco. She reports that she does not drink alcohol and does not use drugs.The patient is accompanied by her sister today.  Allergies: No Known Allergies  Current Medications: Current Outpatient Medications  Medication Sig Dispense Refill   acetaminophen (TYLENOL) 500 MG tablet Take 500 mg by mouth every 6 (six) hours as needed for pain or headache.     Calcium Carbonate (CALTRATE 600 PO) Take 1 tablet by mouth in the morning and at bedtime.     fentaNYL (DURAGESIC) 12 MCG/HR Place 1 patch onto the skin every 3 (three) days. 5 patch 0   HYDROcodone-acetaminophen (NORCO/VICODIN) 5-325 MG tablet Take 1 tablet by mouth every 4 (four) hours as needed. 100 tablet 0   LORazepam (ATIVAN) 0.5  MG tablet Take 1 tablet (0.5 mg total) by mouth every 6 (six) hours as needed for anxiety. 30 tablet 0   metoprolol succinate (TOPROL-XL) 25 MG 24 hr tablet Take 1 tablet (25 mg total) by mouth 2 (two) times daily. 180 tablet 3   Multiple Vitamin (MULTIVITAMIN WITH MINERALS) TABS tablet Take 1 tablet by mouth daily. Centrum Silver     ondansetron (ZOFRAN) 4 MG tablet Take 1 tablet (4 mg total) by mouth every 4 (four) hours as needed for nausea. 90 tablet 3   potassium chloride SA (KLOR-CON M) 20 MEQ tablet Take 40 mEq by mouth 2 (two) times daily. Takes 5 tablets daily while taking Torsemide     Probiotic Product (PROBIOTIC  PO) Take 1 capsule by mouth daily.     prochlorperazine (COMPAZINE) 10 MG tablet Take 1 tablet (10 mg total) by mouth every 6 (six) hours as needed for nausea or vomiting. 90 tablet 3   promethazine-dextromethorphan (PROMETHAZINE-DM) 6.25-15 MG/5ML syrup Take 5 mLs by mouth 4 (four) times daily as needed for cough. 118 mL 0   No current facility-administered medications for this visit.         I,Gabriella Ballesteros,acting as a scribe for Derwood Kaplan, MD.,have documented all relevant documentation on the behalf of Derwood Kaplan, MD,as directed by  Derwood Kaplan, MD while in the presence of Derwood Kaplan, MD.

## 2022-08-06 DIAGNOSIS — I4892 Unspecified atrial flutter: Secondary | ICD-10-CM | POA: Diagnosis not present

## 2022-08-06 DIAGNOSIS — C541 Malignant neoplasm of endometrium: Secondary | ICD-10-CM | POA: Diagnosis not present

## 2022-08-06 DIAGNOSIS — D469 Myelodysplastic syndrome, unspecified: Secondary | ICD-10-CM | POA: Diagnosis not present

## 2022-08-06 DIAGNOSIS — C7801 Secondary malignant neoplasm of right lung: Secondary | ICD-10-CM | POA: Diagnosis not present

## 2022-08-06 DIAGNOSIS — C7931 Secondary malignant neoplasm of brain: Secondary | ICD-10-CM | POA: Diagnosis not present

## 2022-08-09 DIAGNOSIS — D469 Myelodysplastic syndrome, unspecified: Secondary | ICD-10-CM

## 2022-08-12 ENCOUNTER — Encounter: Payer: Self-pay | Admitting: Oncology

## 2022-08-12 ENCOUNTER — Other Ambulatory Visit: Payer: Medicare Other

## 2022-08-12 ENCOUNTER — Inpatient Hospital Stay: Payer: Medicare Other | Attending: Internal Medicine | Admitting: Oncology

## 2022-08-12 ENCOUNTER — Inpatient Hospital Stay: Payer: Medicare Other

## 2022-08-12 ENCOUNTER — Other Ambulatory Visit: Payer: Self-pay | Admitting: Oncology

## 2022-08-12 VITALS — BP 109/53 | HR 98 | Temp 97.6°F | Resp 19 | Ht 66.0 in | Wt 182.7 lb

## 2022-08-12 DIAGNOSIS — D469 Myelodysplastic syndrome, unspecified: Secondary | ICD-10-CM | POA: Insufficient documentation

## 2022-08-12 DIAGNOSIS — D649 Anemia, unspecified: Secondary | ICD-10-CM

## 2022-08-12 DIAGNOSIS — L02519 Cutaneous abscess of unspecified hand: Secondary | ICD-10-CM

## 2022-08-12 DIAGNOSIS — C541 Malignant neoplasm of endometrium: Secondary | ICD-10-CM | POA: Diagnosis not present

## 2022-08-12 DIAGNOSIS — E876 Hypokalemia: Secondary | ICD-10-CM

## 2022-08-12 DIAGNOSIS — Z23 Encounter for immunization: Secondary | ICD-10-CM | POA: Insufficient documentation

## 2022-08-12 DIAGNOSIS — Z79899 Other long term (current) drug therapy: Secondary | ICD-10-CM | POA: Insufficient documentation

## 2022-08-12 LAB — COMPREHENSIVE METABOLIC PANEL
Albumin: 3.5 (ref 3.5–5.0)
Calcium: 8.9 (ref 8.7–10.7)

## 2022-08-12 LAB — HEPATIC FUNCTION PANEL
ALT: 21 U/L (ref 7–35)
AST: 22 (ref 13–35)
Alkaline Phosphatase: 93 (ref 25–125)
Bilirubin, Total: 0.9

## 2022-08-12 LAB — BASIC METABOLIC PANEL
BUN: 16 (ref 4–21)
CO2: 27 — AB (ref 13–22)
Chloride: 98 — AB (ref 99–108)
Creatinine: 0.4 — AB (ref 0.5–1.1)
Glucose: 163
Potassium: 3.5 mEq/L (ref 3.5–5.1)
Sodium: 133 — AB (ref 137–147)

## 2022-08-12 LAB — CBC AND DIFFERENTIAL
HCT: 27 — AB (ref 36–46)
Hemoglobin: 9 — AB (ref 12.0–16.0)
Neutrophils Absolute: 5.74
Platelets: 23 10*3/uL — AB (ref 150–400)
WBC: 6.6

## 2022-08-12 LAB — CBC: RBC: 3.17 — AB (ref 3.87–5.11)

## 2022-08-12 MED ORDER — CEPHALEXIN 500 MG PO CAPS: 500.0000 mg | ORAL_CAPSULE | Freq: Three times a day (TID) | ORAL | 0 refills | Status: AC

## 2022-08-16 DIAGNOSIS — R000 Tachycardia, unspecified: Secondary | ICD-10-CM | POA: Insufficient documentation

## 2022-08-16 NOTE — Progress Notes (Unsigned)
Cardiology Office Note   Date:  08/16/2022   ID:  Brianna, Herring Apr 03, 1956, MRN [id number]  PCP:  Street, [name], MD  Cardiologist:   None Referring:  Street, [name], MD  No chief complaint on file.     History of Present Illness: Brianna Herring is a 66 y.o. female who presents for evaluation of chest pain.  I saw her previously for this.  She has been diagnosed with endometrial cancer with brian mets.  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  They are considering Procrit to avoid transfusions but they are concerned about the possibility of thrombosis.   At the last visit I crept up on the beta blocker because of tachycardia.  ***   ***   ***A coronary CT was ordered.    I had previously seen her because of some vague left arm discomfort when she was hospitalized.  I tried to do a coronary CT but her heart rate was too elevated and we could not get it down.  Since then she has had the above diagnosis.  She gets around slowly with a walker in her home.  She does always have a heart rate that is about 110 or higher when she exerts herself.  She has breathlessness but there is no apparent oxygen desaturations.  She is not having any new pain.  She has had some right leg mild swelling with some possible cellulitis and is on antibiotics for this.   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[place])    History of radiation therapy 03/02/17-03/16/17   vaginal cuff treated to 18 Gy with 3 fractions of 6 Gy    Hyperglycemia    Labile blood pressure    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   Morbid obesity ([place])    Multiple lung nodules on CT 03/14/2018   CT 11/15/16  MPN's largest 6 mm - PET 12/15/16 neg but largest < 66m - CT chest 01/31/18  Largest ?RML proximal/central  @ 1.8 x 1.5 and LLL peripheral cavitary x  1.5 x1.4  - PET 03/22/18 :  Two distinct nodules, def growing since 01/31/18 both hypermetabolic    Phlebitis alone left leg  4-5 yrs ago   Pulmonary embolism on right (HCC)    Redness and swelling of lower leg    Secondary malignant neoplasm of lung [place] 07/18/2019   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) ([place] 01/18/2018    Past Surgical History:  Procedure Laterality Date   APPLICATION OF CRANIAL NAVIGATION N/A 10/08/2020   Procedure: APPLICATION OF CRANIAL NAVIGATION;  Surgeon: TVallarie Mare MD;  [facility]: [facility]  Service: Neurosurgery;  Laterality: N/A;   CRANIOTOMY N/A 10/08/2020   Procedure: CRANIOTOMY - Suboccipital TUMOR EXCISION - Brain Lab;  Surgeon: TVallarie Mare MD;  [facility]  Service: Neurosurgery;  Laterality: N/A;  suboccipital   ROBOTIC ASSISTED TOTAL HYSTERECTOMY WITH BILATERAL SALPINGO OOPHERECTOMY N/A 11/18/2016   Procedure: XI ROBOTIC ASSISTED TOTAL HYSTERECTOMY WITH  BILATERAL SALPINGO OOPHORECTOMY;  Surgeon: Everitt Amber, MD;  Location: [facility];  Service: Gynecology;  Laterality: N/A;   SENTINEL NODE BIOPSY N/A 11/18/2016   Procedure: SENTINEL NODE BIOPSY;  Surgeon: Everitt Amber, MD;  Location: [facility];  Service: Gynecology;  Laterality: N/A;     Current Outpatient Medications  Medication Sig Dispense Refill   acetaminophen (TYLENOL) 500 MG tablet Take 500 mg by mouth every 6 (six) hours as needed for pain or headache.     amiodarone (PACERONE) 200 MG tablet Take 200 mg by mouth 2 (two) times daily.     Calcium Carbonate (CALTRATE 600 PO) Take 1 tablet by mouth in the morning and at bedtime.      cephALEXin (KEFLEX) 500 MG capsule Take 1 capsule (500 mg total) by mouth 3 (three) times daily for 10 days. 30 capsule 0   fentaNYL (DURAGESIC) 12 MCG/HR Place 1 patch onto the skin every 3 (three) days. 5 patch 0   HYDROcodone-acetaminophen (NORCO/VICODIN) 5-325 MG tablet Take 1 tablet by mouth every 4 (four) hours as needed. 100 tablet 0   LANTUS SOLOSTAR 100 UNIT/ML Solostar Pen SMARTSIG:10 Unit(s) SUB-Q Daily     LORazepam (ATIVAN) 0.5 MG tablet Take 1 tablet (0.5 mg total) by mouth every 6 (six) hours as needed for anxiety. 30 tablet 0   metoprolol succinate (TOPROL-XL) 25 MG 24 hr tablet Take 1 tablet (25 mg total) by mouth 2 (two) times daily. 180 tablet 3   Multiple Vitamin (MULTIVITAMIN WITH MINERALS) TABS tablet Take 1 tablet by mouth daily. Centrum Silver     ondansetron (ZOFRAN) 4 MG tablet Take 1 tablet (4 mg total) by mouth every 4 (four) hours as needed for nausea. 90 tablet 3   potassium chloride 20 MEQ/15ML (10%) SOLN Take 20 mEq by mouth daily.     Probiotic Product (PROBIOTIC PO) Take 1 capsule by mouth daily.     prochlorperazine (COMPAZINE) 10 MG tablet Take 1 tablet (10 mg total) by mouth every 6 (six) hours as needed for nausea or vomiting. 90 tablet 3   promethazine-dextromethorphan (PROMETHAZINE-DM) 6.25-15 MG/5ML syrup Take 5 mLs by mouth 4 (four) times daily as needed for cough. 118 mL 0   No current facility-administered medications for this visit.    Allergies:   Patient has no known allergies.    ROS:  Please see the history of present illness.   Otherwise, review of systems are positive for ***.   All other systems are reviewed and negative.    PHYSICAL EXAM: VS:  LMP  (LMP Unknown)  , BMI There is no height or weight on file to calculate BMI. GENERAL:  Well appearing NECK:  No jugular venous distention, waveform within normal limits, carotid upstroke brisk and symmetric, no bruits, no thyromegaly LUNGS:  Clear to auscultation bilaterally CHEST:   Unremarkable HEART:  PMI not displaced or sustained,S1 and S2 within normal limits, no S3, no S4, no clicks, no rubs, *** murmurs ABD:  Flat, positive bowel sounds normal in frequency in pitch, no bruits, no rebound, no guarding, no midline pulsatile mass, no hepatomegaly, no splenomegaly EXT:  2 plus pulses throughout, no edema, no cyanosis no clubbing     ***GEN:  No distress, chronically ill appearing NECK:  No jugular venous distention at 90 degrees, waveform within normal limits, carotid upstroke brisk and symmetric, no bruits, no thyromegaly LYMPHATICS:  No cervical adenopathy LUNGS:  Clear to auscultation bilaterally BACK:  No CVA tenderness CHEST:  Unremarkable HEART:  S1  and S2 within normal limits, no S3, no S4, no clicks, no rubs, no murmurs ABD:  Positive bowel sounds normal in frequency in pitch, no bruits, no rebound, no guarding, unable to assess midline mass or bruit with the patient seated. EXT:  2 plus pulses throughout, mild right greater than left leg edema, no cyanosis no clubbing SKIN:  No rashes no nodules NEURO:  Cranial nerves II through XII grossly intact, motor grossly intact throughout PSYCH:  Cognitively intact, oriented to person place and time   EKG:  EKG is not not ordered today.   Recent Labs: 05/17/2022: TSH 1.370 08/12/2022: ALT 21; BUN 16; Creatinine 0.4; Hemoglobin 9.0; Platelets 23; Potassium 3.5; Sodium 133    Lipid Panel    Component Value Date/Time   CHOL 127 05/17/2022 1256   TRIG 61 05/17/2022 1256   HDL 54 05/17/2022 1256   CHOLHDL 2.4 05/17/2022 1256   LDLCALC 60 05/17/2022 1256      Wt Readings from Last 3 Encounters:  08/12/22 182 lb 11.2 oz (82.9 kg)  07/28/22 190 lb 8 oz (86.4 kg)  07/23/22 193 lb (87.5 kg)      Other studies Reviewed: Additional studies/ records that were reviewed today include: *** . Review of the above records demonstrates:  Please see elsewhere in the note.     ASSESSMENT AND PLAN:  CHEST PAIN:    ***  Her chest discomfort was atypical.  She is not a good candidate for further imaging any particular invasive imaging.  She would not be a good candidate for any therapy and she is not having any ongoing symptoms.  I am not going to attempt further imaging.   TACHYCARDIA:  ***  She has had longstanding sinus tachycardia.  I am going to increase her beta-blocker slightly but suspect this is multifactorial.  ANEMIA:   ***  There was a question whether she could use Procrit with some hesitancy because of the potential for thrombosis.  I would not see a cardiac reason she could not use this if she and her oncologist decide.  I discussed this with patient today.  I will send this message.   Current medicines are reviewed at length with the patient today.  The patient does not have concerns regarding medicines.  The following changes have been made:  ***  Labs/ tests ordered today include:  ***  No orders of the defined types were placed in this encounter.    Disposition:   FU with me in *** months. Ronnell Guadalajara, MD  08/16/2022 7:59 PM    [place]

## 2022-08-17 ENCOUNTER — Ambulatory Visit: Payer: Medicare Other | Attending: Cardiology | Admitting: Cardiology

## 2022-08-17 ENCOUNTER — Encounter: Payer: Self-pay | Admitting: Cardiology

## 2022-08-17 VITALS — BP 107/66 | HR 114 | Ht 66.0 in | Wt 182.0 lb

## 2022-08-17 DIAGNOSIS — R072 Precordial pain: Secondary | ICD-10-CM | POA: Diagnosis present

## 2022-08-17 DIAGNOSIS — R000 Tachycardia, unspecified: Secondary | ICD-10-CM | POA: Diagnosis not present

## 2022-08-17 MED ORDER — AMIODARONE HCL 200 MG PO TABS: 200.0000 mg | ORAL_TABLET | Freq: Every day | ORAL | Status: DC

## 2022-08-17 NOTE — Patient Instructions (Signed)
Medication Instructions:   REDUCE AMIODARONE TO 200 MG ONCE DAILY  *If you need a refill on your cardiac medications before your next appointment, please call your pharmacy*   Follow-Up: At [facility], you and your health needs are our priority.  As part of our continuing mission to provide you with exceptional heart care, we have created designated Provider Care Teams.  These Care Teams include your primary Cardiologist (physician) and Advanced Practice Providers (APPs -  Physician Assistants and Nurse Practitioners) who all work together to provide you with the care you need, when you need it.  We recommend signing up for the patient portal called "MyChart".  Sign up information is provided on this After Visit Summary.  MyChart is used to connect with patients for Virtual Visits (Telemedicine).  Patients are able to view lab/test results, encounter notes, upcoming appointments, etc.  Non-urgent messages can be sent to your provider as well.   To learn more about what you can do with MyChart, go to [email or web address].    Your next appointment:   1 month(s)  The format for your next appointment:   Virtual Visit   Provider:   Minus Breeding MD

## 2022-08-18 ENCOUNTER — Inpatient Hospital Stay: Payer: Medicare Other

## 2022-08-18 ENCOUNTER — Encounter: Payer: Self-pay | Admitting: Hematology and Oncology

## 2022-08-18 ENCOUNTER — Inpatient Hospital Stay (INDEPENDENT_AMBULATORY_CARE_PROVIDER_SITE_OTHER): Payer: Medicare Other | Admitting: Hematology and Oncology

## 2022-08-18 VITALS — BP 117/58 | HR 107 | Temp 98.0°F | Resp 18 | Ht 66.0 in | Wt 179.1 lb

## 2022-08-18 DIAGNOSIS — C7801 Secondary malignant neoplasm of right lung: Secondary | ICD-10-CM | POA: Diagnosis not present

## 2022-08-18 DIAGNOSIS — C541 Malignant neoplasm of endometrium: Secondary | ICD-10-CM | POA: Diagnosis not present

## 2022-08-18 DIAGNOSIS — D469 Myelodysplastic syndrome, unspecified: Secondary | ICD-10-CM | POA: Diagnosis present

## 2022-08-18 DIAGNOSIS — L89152 Pressure ulcer of sacral region, stage 2: Secondary | ICD-10-CM

## 2022-08-18 DIAGNOSIS — Z79899 Other long term (current) drug therapy: Secondary | ICD-10-CM | POA: Diagnosis not present

## 2022-08-18 DIAGNOSIS — D649 Anemia, unspecified: Secondary | ICD-10-CM

## 2022-08-18 DIAGNOSIS — Z23 Encounter for immunization: Secondary | ICD-10-CM | POA: Diagnosis not present

## 2022-08-18 DIAGNOSIS — C7802 Secondary malignant neoplasm of left lung: Secondary | ICD-10-CM

## 2022-08-18 DIAGNOSIS — C7931 Secondary malignant neoplasm of brain: Secondary | ICD-10-CM

## 2022-08-18 LAB — CBC AND DIFFERENTIAL
Band Neutrophils: 18
Blasts: 3
Eosinophils, %: 1
HCT: 24 — AB (ref 36–46)
Hemoglobin: 8 — AB (ref 12.0–16.0)
LYMPH%: 29 %
MCV: 83 (ref 81–99)
Metamyelocytes Relative: 4 %
Monocyte %: 4
Myelocytes: 5
Neutrophil %: 36
Neutrophils Absolute: 2.75
Platelets: 13 10*3/uL — AB (ref 150–400)
WBC: 5.1

## 2022-08-18 LAB — HEPATIC FUNCTION PANEL
ALT: 19 U/L (ref 7–35)
AST: 28 (ref 13–35)
Alkaline Phosphatase: 73 (ref 25–125)
Bilirubin, Total: 1

## 2022-08-18 LAB — COMPREHENSIVE METABOLIC PANEL
Albumin: 3.5 (ref 3.5–5.0)
Calcium: 8.9 (ref 8.7–10.7)

## 2022-08-18 LAB — BASIC METABOLIC PANEL
BUN: 10 (ref 4–21)
CO2: 28 — AB (ref 13–22)
Chloride: 97 — AB (ref 99–108)
Creatinine: 0.4 — AB (ref 0.5–1.1)
Glucose: 234
Potassium: 4 mEq/L (ref 3.5–5.1)
Sodium: 133 — AB (ref 137–147)

## 2022-08-18 LAB — CBC: RBC: 2.84 — AB (ref 3.87–5.11)

## 2022-08-18 NOTE — Assessment & Plan Note (Signed)
Solitary right cerebellar metastasis, status post preoperative stereotactic radiosurgery followed by surgical resection.Pathology was consistent with adenocarcinoma from the endometrium.She continues to follow with Dr. Mickeal Skinner of Neuro-Oncology. MRI on August 17 revealed stable post-surgical and post-treatment appearance of the right cerebellar hemisphere. No new intracranial metastatic lesion is identified.  Stable white matter changes consistent with chronic microvascular ischemia seen.

## 2022-08-18 NOTE — Assessment & Plan Note (Signed)
Newly diagnosed myelodysplasia-excess blasts.  She has had associated anemia requiring regular transfusion.  She was hospitalized in September with sepsis due to pneumonia.  During hospitalization, she developed severe thrombocytopenia, which has not resolved and she has required platelet transfusions as well.  She and her family have decided against any aggressive therapy, so she is on supportive care.  Her platelet count is down to 13,000 and she has been having nosebleeds, so we will transfuse 1 unit of platelets this week.  Her hemoglobin has dropped from 9 to 8 and she is symptomatic, so we will transfuse 1 unit of packed red blood cells this week.  Unfortunately, she was unable to get home health services due to her terminal disease.  I contacted hosp[facility] the [place] and they are able to offer palliative care services to the patient.  We will plan to see her back in 1 week for repeat clinical assessment.

## 2022-08-18 NOTE — Assessment & Plan Note (Signed)
Persistent decubitus near the coccyx, this hospitalization.  This appears to be grade 2.  It does not appear infected.  I will refer her to the wound center for further evaluation and treatment.

## 2022-08-18 NOTE — Progress Notes (Signed)
CRITICAL VALUE STICKER  CRITICAL VALUE:  plt 13, Hgb 8.  RECEIVER (on-site recipient of call):  Ott Zimmerle D., RN  DATE & TIME NOTIFIED:   08/18/2022 @ 1055  MESSENGER (representative from lab):  Scottie  MD NOTIFIED: Zachery Dauer., PA  TIME OF NOTIFICATION:  1100  RESPONSE:  Transfuse 1 unit platelet and 1 unit PRBC on Friday 09/20/2022 @ 1000.  Patient and Zachery Dauer. PA aware.

## 2022-08-18 NOTE — Assessment & Plan Note (Signed)
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 She had progression of her disease in August 2020. As her tumor was MSI-high, she was placed on palliative pembrolizumab. Unfortunately, she had progressive disease after 6 cycles of pembrolizumab. She was therefore placed on palliative carboplatin/paclitaxel/bevacizumb. Bevacizumab was discontinued due to persistent vaginal bleeding/hematuria and epistaxis. She completed 6 cycles of carboplatin/paclitaxel in May 2020. CT imaging April 2023 remained stable.  There was no definite evidence of recurrence on CT imaging on August 4.  Stable sub-5 mm pulmonary nodules in the right lung were identified.

## 2022-08-18 NOTE — Addendum Note (Signed)
Addended by: Daryel November on: 08/18/2022 01:10 PM   Modules accepted: Orders

## 2022-08-18 NOTE — Assessment & Plan Note (Signed)
History of stage IB high-grade endometrial carcinoma treated with hysterectomy/bilateral salpingo-oophorectomy, followed by adjuvant radiation and brachytherapy in 2018.

## 2022-08-18 NOTE — Progress Notes (Signed)
[facility]  [address] 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  She has had associated anemia requiring regular transfusion.  She was hospitalized in September with sepsis due to pneumonia.  During hospitalization, she developed severe thrombocytopenia, which has not resolved and she has required platelet transfusions as well.  She and her family have decided against any aggressive therapy, so she is on supportive care.  Her platelet count is down to 13,000 and she has been having nosebleeds, so we will transfuse 1 unit of platelets this week.  Her hemoglobin has dropped from 9 to 8 and she is symptomatic, so we will transfuse 1 unit of packed red blood cells this week.  Unfortunately, she was unable to get home health services due to her terminal disease.  I contacted [facility] [place] and they are able to offer palliative care services to the patient.  We will plan to see her back in 1 week for repeat clinical assessment.  Decubitus ulcer of coccygeal region, stage II ([place]) Persistent decubitus near the coccyx, this hospitalization.  This appears to be grade 2.  It does not appear infected.  I will refer her to the wound center for further evaluation and treatment.  Endometrial adenocarcinoma ([place]) History of stage IB high-grade endometrial carcinoma treated with hysterectomy/bilateral salpingo-oophorectomy, followed by adjuvant radiation and brachytherapy in 2018.  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  She had progression of her disease in August 2020. As her tumor  was MSI-high, she was placed on palliative pembrolizumab. Unfortunately, she had progressive disease after 6 cycles of pembrolizumab.  She was therefore placed on palliative carboplatin/paclitaxel/bevacizumb. Bevacizumab was discontinued due to persistent vaginal bleeding/hematuria and epistaxis.  She completed 6 cycles of carboplatin/paclitaxel in May 2020. CT imaging April 2023 remained stable.  There was no definite evidence of recurrence on CT imaging on August 4.  Stable sub-5 mm pulmonary nodules in the right lung were identified.  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  She continues to follow with Dr. Mickeal Skinner of Neuro-Oncology.  MRI on August 17 revealed stable post-surgical and post-treatment appearance of the right cerebellar hemisphere. No new intracranial metastatic lesion is identified.  Stable white matter changes consistent with chronic microvascular ischemia seen.   The patient understands the plans discussed today and is in agreement with them.  She knows to contact our office if she develops concerns prior to her next appointment.   I provided 20 minutes of face-to-face time during this encounter and > 50% was spent counseling as documented under my assessment and plan.    Brianna Pickles, PA-C  [facility] [address] [place] [address] Dept: [phone] Dept Fax: [phone]   Orders Placed This Encounter  Procedures   CBC and differential    This external order was created through the Results Console.   CBC    This external order was created through the Results Console.   Basic metabolic panel    This external order was  created through the Results Console.   Comprehensive metabolic panel    This external  order was created through the Results Console.   Hepatic function panel    This external order was created through the Results Console.      CHIEF COMPLAINT:  CC: Myelodysplasia-excess blasts  Current Treatment: Supportive care with transfusions as needed  HISTORY OF PRESENT ILLNESS:   Oncology History  Endometrial adenocarcinoma ([place])  10/19/2016 Cancer Staging   Staging form: Corpus Uteri - Carcinoma, AJCC 7th Edition - Clinical stage from 10/19/2016: FIGO Stage IB (T1b, N0, M0) - Signed by Derwood Kaplan, MD on 09/14/2021 Staged by: Managing physician Diagnostic confirmation: Positive histology Specimen type: Excision Histopathologic type: Adenocarcinoma, endocervical type Stage prefix: Initial diagnosis Laterality: Bilateral Tumor size (mm): 40 Histologic grade (G): G3 Lymph-vascular invasion (LVI): LVI present/identified, NOS Residual tumor (R): R0 - None Peritoneal cytology results: Negative Pelvic nodal status: Negative Para-aortic status: Negative Stage used in treatment planning: Yes National guidelines used in treatment planning: Yes Type of national guideline used in treatment planning: NCCN   11/03/2016 Initial Diagnosis   Endometrial adenocarcinoma ([place])   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[place])  09/19/2020 Initial Diagnosis   Brain metastasis ([place])       INTERVAL HISTORY:  Brianna Herring is here today for repeat clinical assessment.  She was hospitalized in September with sepsis due to pneumonia.  Her sister accompanies her today and states the patient has a persistent decubitus ulcer.  She also states that patient has increased cough and yellow sinus drainage.  The patient had cellulitis of the left arm last week, which has resolved with cephalexin.  She reports nosebleeds today.  Her sister states there is also been blood in her stool.  The patient remains fatigued and is able to do limited activity. She denies fevers or chills. She denies pain. Her  appetite is good. Her weight has decreased 4 pounds over last week .  Her sister states they saw the cardiologist yesterday and he decreased her amiodarone to once a day.  REVIEW OF SYSTEMS:  Review of Systems  Constitutional:  Positive for fatigue. Negative for appetite change, chills, fever and unexpected weight change.  HENT:   Positive for nosebleeds. Negative for lump/mass, mouth sores and sore throat.   Respiratory:  Positive for cough. Negative for shortness of breath.   Cardiovascular:  Negative for chest pain and leg swelling.  Gastrointestinal:  Positive for blood in stool. Negative for abdominal pain, constipation, diarrhea, nausea and vomiting.  Genitourinary:  Negative for difficulty urinating, dysuria, frequency and hematuria.   Musculoskeletal:  Negative for arthralgias, back pain and myalgias.  Skin:  Negative for rash.  Neurological:  Negative for dizziness and headaches.  Hematological:  Negative for adenopathy. Does not bruise/bleed easily.  Psychiatric/Behavioral:  Negative for depression and sleep disturbance. The patient is not nervous/anxious.      VITALS:  Blood pressure (!) 117/58, pulse (!) 107, temperature 98 F (36.7 C), temperature source Oral, resp. rate 18, height '5\' 6"'  (1.676 m), weight 179 lb 1.6 oz (81.2 kg), SpO2 97 %.  Wt Readings from Last 3 Encounters:  08/18/22 179 lb 1.6 oz (81.2 kg)  08/17/22 182 lb (82.6 kg)  08/12/22 182 lb 11.2 oz (82.9 kg)    Body mass index is 28.91 kg/m.  Performance status (ECOG): 2 - Symptomatic, <50% confined to bed  PHYSICAL EXAM:  Physical Exam Vitals and nursing note reviewed.  Constitutional:      General: She  is not in acute distress.    Appearance: Normal appearance.  HENT:     Head: Normocephalic and atraumatic.     Nose:     Right Nostril: Epistaxis present.     Left Nostril: Epistaxis present.     Mouth/Throat:     Mouth: Mucous membranes are moist.     Pharynx: Oropharynx is clear. No oropharyngeal  exudate or posterior oropharyngeal erythema.  Eyes:     General: No scleral icterus.    Extraocular Movements: Extraocular movements intact.     Conjunctiva/sclera: Conjunctivae normal.     Pupils: Pupils are equal, round, and reactive to light.  Cardiovascular:     Rate and Rhythm: Normal rate and regular rhythm.     Heart sounds: Normal heart sounds. No murmur heard.    No friction rub. No gallop.  Pulmonary:     Effort: Pulmonary effort is normal.     Breath sounds: Normal breath sounds. No wheezing, rhonchi or rales.  Abdominal:     General: There is no distension.     Palpations: Abdomen is soft. There is no hepatomegaly, splenomegaly or mass.     Tenderness: There is no abdominal tenderness.  Musculoskeletal:        General: Normal range of motion.     Cervical back: Normal range of motion and neck supple. No tenderness.     Right lower leg: No edema.     Left lower leg: No edema.  Lymphadenopathy:     Cervical: No cervical adenopathy.     Upper Body:     Right upper body: No supraclavicular or axillary adenopathy.     Left upper body: No supraclavicular or axillary adenopathy.  Skin:    General: Skin is warm and dry.     Coloration: Skin is not jaundiced.     Findings: No rash.  Neurological:     Mental Status: She is alert and oriented to person, place, and time.     Cranial Nerves: No cranial nerve deficit.  Psychiatric:        Mood and Affect: Mood normal.        Behavior: Behavior normal.        Thought Content: Thought content normal.     LABS:      Latest Ref Rng & Units 08/18/2022   12:00 AM 08/12/2022   12:00 AM 07/22/2022   12:00 AM  CBC  WBC  5.1     6.6     5.6      Hemoglobin 12.0 - 16.0 8.0     9.0     8.1      Hematocrit 36 - 46 '24     27     25      ' Platelets 150 - 400 K/uL 13     23     54         This result is from an external source.      Latest Ref Rng & Units 08/18/2022   12:00 AM 08/12/2022   12:00 AM 07/22/2022   12:00 AM  CMP   BUN 4 - '21 10     16     9      ' Creatinine 0.5 - 1.1 0.4     0.4     0.4      Sodium 137 - 147 133     133     134      Potassium 3.5 - 5.1 mEq/L 4.0  3.5     3.7      Chloride 99 - 108 97     98     98      CO2 13 - '22 28     27     28      ' Calcium 8.7 - 10.7 8.9     8.9     8.6      Alkaline Phos 25 - 125 73     93     90      AST 13 - 35 28     22     32      ALT 7 - 35 U/L '19     21     18         ' This result is from an external source.     No results found for: "CEA1", "CEA" / No results found for: "CEA1", "CEA" No results found for: "PSA1" No results found for: "CAN199" Lab Results  Component Value Date   CAN125 7.2 12/10/2021    Lab Results  Component Value Date   TOTALPROTELP 7.8 06/14/2022   ALBUMINELP 2.5 (L) 06/14/2022   A1GS 0.5 (H) 06/14/2022   A2GS 1.3 (H) 06/14/2022   BETS 1.0 06/14/2022   GAMS 2.4 (H) 06/14/2022   MSPIKE Not Observed 06/14/2022   SPEI Comment 06/14/2022   Lab Results  Component Value Date   TIBC 227 (L) 06/10/2022   FERRITIN 762 (H) 06/10/2022   IRONPCTSAT 12 06/10/2022   Lab Results  Component Value Date   LDH 192 06/09/2022    STUDIES:  No results found.    HISTORY:   Past Medical History:  Diagnosis Date   Acquired thrombophilia ([place])    Anemia of chronic disease    Aortic atherosclerosis ([place])    Atherosclerosis of native coronary artery of native heart with angina pectoris (HCC)    BMI 34.0-34.9,adult    Body mass index (BMI) 35.0-35.9, adult 09/29/2020   Cancer [facility])    endometrial   Cancer, metastatic to lung [facility])    Decubitus ulcer of coccygeal region, stage II ([place]) 08/18/2022   Endometrial adenocarcinoma ([place]) 11/03/2016   Endometrioid adenocarcinoma of uterus ([place])    History of radiation therapy 03/02/17-03/16/17   vaginal cuff treated to 18 Gy with 3 fractions of 6 Gy   Hyperglycemia    Labile blood pressure    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[place]) 09/19/2020   Morbid obesity ([place])     Multiple lung nodules on CT 03/14/2018   CT 11/15/16  MPN's largest 6 mm - PET 12/15/16 neg but largest < 42m - CT chest 01/31/18  Largest ?RML proximal/central  @ 1.8 x 1.5 and LLL peripheral cavitary x  1.5 x1.4  - PET 03/22/18 :  Two distinct nodules, def growing since 01/31/18 both hypermetabolic    Phlebitis alone left leg  4-5 yrs ago   Pulmonary embolism on right (HCC)    Redness and swelling of lower leg    Secondary malignant neoplasm of lung ([place] 07/18/2019   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) ([place] 01/18/2018    Past Surgical History:  Procedure Laterality Date   APPLICATION OF CRANIAL NAVIGATION N/A 10/08/2020   Procedure: APPLICATION OF CRANIAL NAVIGATION;  Surgeon: TVallarie Mare MD;  [place] [facility]  Service: Neurosurgery;  Laterality: N/A;   CRANIOTOMY N/A 10/08/2020   Procedure: CRANIOTOMY - Suboccipital TUMOR EXCISION - Brain Lab;  Surgeon: TDuffy Rhody  G, MD;  Location: [place];  Service: Neurosurgery;  Laterality: N/A;  suboccipital   ROBOTIC ASSISTED TOTAL HYSTERECTOMY WITH BILATERAL SALPINGO OOPHERECTOMY N/A 11/18/2016   Procedure: XI ROBOTIC ASSISTED TOTAL HYSTERECTOMY WITH BILATERAL SALPINGO OOPHORECTOMY;  Surgeon: Everitt Amber, MD;  Location: [facility];  Service: Gynecology;  Laterality: N/A;   SENTINEL NODE BIOPSY N/A 11/18/2016   Procedure: SENTINEL NODE BIOPSY;  Surgeon: Everitt Amber, MD;  Location: [facility];  Service: Gynecology;  Laterality: N/A;    Family History  Problem Relation Age of Onset   Atrial fibrillation Mother    Lung cancer Father    Breast cancer Sister    Heart failure Sister     Social History:  reports that she has never smoked. She has never used smokeless tobacco. She reports that she does not drink alcohol and does not use drugs.The patient is accompanied by her sister, Brianna Herring, today.  Allergies: No Known Allergies  Current Medications: Current Outpatient Medications  Medication Sig Dispense Refill    acetaminophen (TYLENOL) 500 MG tablet Take 500 mg by mouth every 6 (six) hours as needed for pain or headache.     amiodarone (PACERONE) 200 MG tablet Take 1 tablet (200 mg total) by mouth daily.     Calcium Carbonate (CALTRATE 600 PO) Take 1 tablet by mouth in the morning and at bedtime.     cephALEXin (KEFLEX) 500 MG capsule Take 1 capsule (500 mg total) by mouth 3 (three) times daily for 10 days. 30 capsule 0   fentaNYL (DURAGESIC) 12 MCG/HR Place 1 patch onto the skin every 3 (three) days. 5 patch 0   HYDROcodone-acetaminophen (NORCO/VICODIN) 5-325 MG tablet Take 1 tablet by mouth every 4 (four) hours as needed. 100 tablet 0   LANTUS SOLOSTAR 100 UNIT/ML Solostar Pen SMARTSIG:10 Unit(s) SUB-Q Daily     LORazepam (ATIVAN) 0.5 MG tablet Take 1 tablet (0.5 mg total) by mouth every 6 (six) hours as needed for anxiety. 30 tablet 0   metoprolol succinate (TOPROL-XL) 25 MG 24 hr tablet Take 1 tablet (25 mg total) by mouth 2 (two) times daily. 180 tablet 3   Multiple Vitamin (MULTIVITAMIN WITH MINERALS) TABS tablet Take 1 tablet by mouth daily. Centrum Silver     ondansetron (ZOFRAN) 4 MG tablet Take 1 tablet (4 mg total) by mouth every 4 (four) hours as needed for nausea. 90 tablet 3   potassium chloride 20 MEQ/15ML (10%) SOLN Take 20 mEq by mouth daily.     Probiotic Product (PROBIOTIC PO) Take 1 capsule by mouth daily.     promethazine-dextromethorphan (PROMETHAZINE-DM) 6.25-15 MG/5ML syrup Take 5 mLs by mouth 4 (four) times daily as needed for cough. 118 mL 0   No current facility-administered medications for this visit.

## 2022-08-19 LAB — PREPARE RBC (CROSSMATCH)

## 2022-08-20 ENCOUNTER — Telehealth: Payer: Self-pay

## 2022-08-20 ENCOUNTER — Inpatient Hospital Stay: Payer: Medicare Other

## 2022-08-20 ENCOUNTER — Telehealth: Payer: Self-pay | Admitting: Cardiology

## 2022-08-20 DIAGNOSIS — D469 Myelodysplastic syndrome, unspecified: Secondary | ICD-10-CM

## 2022-08-20 DIAGNOSIS — R000 Tachycardia, unspecified: Secondary | ICD-10-CM

## 2022-08-20 MED ORDER — SODIUM CHLORIDE 0.9% FLUSH
10.0000 mL | INTRAVENOUS | Status: AC | PRN
  Administered 2022-08-20: 10 mL

## 2022-08-20 MED ORDER — AMIODARONE HCL 200 MG PO TABS: 200.0000 mg | ORAL_TABLET | Freq: Every day | ORAL | 3 refills | Status: AC

## 2022-08-20 MED ORDER — ACETAMINOPHEN 325 MG PO TABS
650.0000 mg | ORAL_TABLET | Freq: Once | ORAL | Status: AC
  Administered 2022-08-20: 650 mg via ORAL
  Filled 2022-08-20: qty 2

## 2022-08-20 MED ORDER — SODIUM CHLORIDE 0.9% IV SOLUTION
250.0000 mL | Freq: Once | INTRAVENOUS | Status: AC
  Administered 2022-08-20: 250 mL via INTRAVENOUS

## 2022-08-20 MED ORDER — HEPARIN SOD (PORK) LOCK FLUSH 100 UNIT/ML IV SOLN
250.0000 [IU] | INTRAVENOUS | Status: AC | PRN
  Administered 2022-08-20: 500 [IU]

## 2022-08-20 MED ORDER — DIPHENHYDRAMINE HCL 25 MG PO CAPS
25.0000 mg | ORAL_CAPSULE | Freq: Once | ORAL | Status: AC
  Administered 2022-08-20: 25 mg via ORAL
  Filled 2022-08-20: qty 1

## 2022-08-20 NOTE — Telephone Encounter (Signed)
Sister informed of delay in treatment. States understanding that patient will be finished around 3:30 pm

## 2022-08-20 NOTE — Telephone Encounter (Signed)
Pt c/o medication issue:  1. Name of Medication: amiodarone (PACERONE) 200 MG tablet  2. How are you currently taking this medication (dosage and times per day)? Take 1 tablet (200 mg total) by mouth daily.  3. Are you having a reaction (difficulty breathing--STAT)?   4. What is your medication issue? Patient sister called stating this script was suppose to be sent in the other day when she was in the office to the [place], [place].  However they never received it.  If it can please be sent to the pharmacy.

## 2022-08-20 NOTE — Telephone Encounter (Signed)
Spoke with Brianna Herring (EC) regarding refill of amiodarone. Prescription sent to pharmacy of choice. Brianna Herring verbalizes understanding.

## 2022-08-20 NOTE — Patient Instructions (Signed)
Platelet Transfusion A platelet transfusion is a procedure in which a person receives donated platelets through an IV. Platelets are parts of blood that stick together and form a clot to help the body stop bleeding after an injury. If you have too few platelets, your blood may have trouble clotting. This may cause you to bleed and bruise very easily. You may need a platelet transfusion if you have a condition that causes a low number of platelets (thrombocytopenia). A platelet transfusion may be used to stop or prevent excessive bleeding. Tell a health care provider about: Any reactions you have had during previous transfusions. Any allergies you have. All medicines you are taking, including vitamins, herbs, eye drops, creams, and over-the-counter medicines. Any bleeding problems you have. Any surgeries you have had. Any medical conditions you have. Whether you are pregnant or may be pregnant. What are the risks? Generally, this is a safe procedure. However, problems may occur, including: Fever. Infection. Allergic reaction to the donated (donor) platelets. Your body's disease-fighting system (immune system) attacking the donor platelets (hemolytic reaction). This is rare. A rare reaction that causes lung damage (transfusion-related acute lung injury). What happens before the procedure? Medicines Ask your health care provider about: Changing or stopping your regular medicines. This is especially important if you are taking diabetes medicines or blood thinners. Taking medicines such as aspirin and ibuprofen. These medicines can thin your blood. Do not take these medicines unless your health care provider tells you to take them. Taking over-the-counter medicines, vitamins, herbs, and supplements. General instructions You will have a blood test to determine your blood type. Your blood type determines what kind of platelets you will be given. Follow instructions from your health care provider  about eating or drinking restrictions. If you have had an allergic reaction to a transfusion in the past, you may be given medicine to help prevent a reaction. Your temperature, blood pressure, pulse, and breathing will be monitored. What happens during the procedure?  An IV will be inserted into one of your veins. For your safety, two health care providers will verify your identity along with the donor platelets about to be infused. A bag of donor platelets will be connected to your IV. The platelets will flow into your bloodstream. This usually takes 30-60 minutes. Your temperature, blood pressure, pulse, and breathing will be monitored during the transfusion. This helps detect early signs of any reaction. You will also be monitored for other symptoms that may indicate a reaction, including chills, hives, or itching. If you have signs of a reaction at any time, your transfusion will be stopped, and you may be given medicine to help manage the reaction. When your transfusion is complete, your IV will be removed. Pressure may be applied to the IV site for a few minutes to stop any bleeding. The IV site will be covered with a bandage (dressing). The procedure may vary among health care providers and hospitals. What can I expect after the procedure? Your blood pressure, temperature, pulse, and breathing will be monitored until you leave the hospital or clinic. You may have some bruising and soreness at your IV site. Follow these instructions at home: Medicines Take over-the-counter and prescription medicines only as told by your health care provider. Talk with your health care provider before you take any medicines that contain aspirin or NSAIDs, such as ibuprofen. These medicines increase your risk for dangerous bleeding. IV site care Check your IV site every day for signs of infection. Check for:   Redness, swelling, or pain. Fluid or blood. If fluid or blood drains from your IV site, use your  hands to press down firmly on a bandage covering the area for a minute or two. Doing this should stop the bleeding. Warmth. Pus or a bad smell. General instructions Change or remove your dressing as told by your health care provider. Return to your normal activities as told by your health care provider. Ask your health care provider what activities are safe for you. Do not take baths, swim, or use a hot tub until your health care provider approves. Ask your health care provider if you may take showers. Keep all follow-up visits. This is important. Contact a health care provider if: You have a headache that does not go away with medicine. You have hives, rash, or itchy skin. You have nausea or vomiting. You feel unusually tired or weak. You have signs of infection at your IV site. Get help right away if: You have a fever or chills. You urinate less often than usual. Your urine is darker colored than normal. You have any of the following: Trouble breathing. Pain in your back, abdomen, or chest. Cool, clammy skin. A fast heartbeat. Summary Platelets are tiny pieces of blood cells that clump together to form a blood clot when you have an injury. If you have too few platelets, your blood may have trouble clotting. A platelet transfusion is a procedure in which you receive donated platelets through an IV. A platelet transfusion may be used to stop or prevent excessive bleeding. After the procedure, check your IV site every day for signs of infection. This information is not intended to replace advice given to you by your health care provider. Make sure you discuss any questions you have with your health care provider. Document Revised: 04/30/2021 Document Reviewed: 04/30/2021 Elsevier Patient Education  2023 Elsevier Inc. Blood Transfusion, Adult, Care After After a blood transfusion, it is common to have: Bruising and soreness at the IV site. A headache. Follow these instructions at  home: Your doctor may give you more instructions. If you have problems, contact your doctor. Insertion site care     Follow instructions from your doctor about how to take care of your insertion site. This is where an IV tube was put into your vein. Make sure you: Wash your hands with soap and water for at least 20 seconds before and after you change your bandage. If you cannot use soap and water, use hand sanitizer. Change your bandage as told by your doctor. Check your insertion site every day for signs of infection. Check for: Redness, swelling, or pain. Bleeding from the site. Warmth. Pus or a bad smell. General instructions Take over-the-counter and prescription medicines only as told by your doctor. Rest as told by your doctor. Go back to your normal activities as told by your doctor. Keep all follow-up visits. You may need to have tests at certain times to check your blood. Contact a doctor if: You have itching or red, swollen areas of skin (hives). You have a fever or chills. You have pain in the head, back, or chest. You feel worried or nervous (anxious). You feel weak after doing your normal activities. You have any of these problems at the insertion site: Redness, swelling, warmth, or pain. Bleeding that does not stop with pressure. Pus or a bad smell. If you received your blood transfusion in an outpatient setting, you will be told whom to contact to report any reactions.   Get help right away if: You have signs of a serious reaction. This may be coming from an allergy or the body's defense system (immune system). Signs include: Trouble breathing or shortness of breath. Swelling of the face or feeling warm (flushed). A widespread rash. Dark pee (urine) or blood in the pee. Fast heartbeat. These symptoms may be an emergency. Get help right away. Call 911. Do not wait to see if the symptoms will go away. Do not drive yourself to the hospital. Summary Bruising and  soreness at the IV site are common. Check your insertion site every day for signs of infection. Rest as told by your doctor. Go back to your normal activities as told by your doctor. Get help right away if you have signs of a serious reaction. This information is not intended to replace advice given to you by your health care provider. Make sure you discuss any questions you have with your health care provider. Document Revised: 01/22/2022 Document Reviewed: 01/22/2022 Elsevier Patient Education  2023 Elsevier Inc.  

## 2022-08-21 LAB — BPAM PLATELET PHERESIS
Blood Product Expiration Date: 202310132359
ISSUE DATE / TIME: 202310130907
Unit Type and Rh: 5100

## 2022-08-21 LAB — TYPE AND SCREEN
ABO/RH(D): O POS
Antibody Screen: NEGATIVE
Unit division: 0

## 2022-08-21 LAB — PREPARE PLATELET PHERESIS: Unit division: 0

## 2022-08-21 LAB — BPAM RBC
Blood Product Expiration Date: 202311122359
ISSUE DATE / TIME: 202310130922
Unit Type and Rh: 5100

## 2022-08-23 ENCOUNTER — Other Ambulatory Visit: Payer: Self-pay | Admitting: *Deleted

## 2022-08-23 DIAGNOSIS — C7931 Secondary malignant neoplasm of brain: Secondary | ICD-10-CM

## 2022-08-25 ENCOUNTER — Encounter: Payer: Self-pay | Admitting: Hematology and Oncology

## 2022-08-25 ENCOUNTER — Inpatient Hospital Stay: Payer: Medicare Other

## 2022-08-25 ENCOUNTER — Telehealth: Payer: Self-pay

## 2022-08-25 ENCOUNTER — Inpatient Hospital Stay (HOSPITAL_BASED_OUTPATIENT_CLINIC_OR_DEPARTMENT_OTHER): Payer: Medicare Other | Admitting: Hematology and Oncology

## 2022-08-25 VITALS — BP 135/60 | HR 101 | Temp 98.4°F | Resp 18 | Ht 66.0 in | Wt 179.3 lb

## 2022-08-25 DIAGNOSIS — D469 Myelodysplastic syndrome, unspecified: Secondary | ICD-10-CM

## 2022-08-25 DIAGNOSIS — L89152 Pressure ulcer of sacral region, stage 2: Secondary | ICD-10-CM | POA: Diagnosis not present

## 2022-08-25 DIAGNOSIS — R109 Unspecified abdominal pain: Secondary | ICD-10-CM

## 2022-08-25 DIAGNOSIS — D649 Anemia, unspecified: Secondary | ICD-10-CM

## 2022-08-25 LAB — BASIC METABOLIC PANEL
BUN: 8 (ref 4–21)
CO2: 25 — AB (ref 13–22)
Chloride: 99 (ref 99–108)
Creatinine: 0.4 — AB (ref 0.5–1.1)
Glucose: 127
Potassium: 3.9 mEq/L (ref 3.5–5.1)
Sodium: 129 — AB (ref 137–147)

## 2022-08-25 LAB — CBC AND DIFFERENTIAL
Band Neutrophils: 19
Eosinophils, %: 2
HCT: 21 — AB (ref 36–46)
Hemoglobin: 6.8 — AB (ref 12.0–16.0)
LYMPH%: 22 %
MCV: 85 (ref 81–99)
Metamyelocytes Relative: 4 %
Myelocytes: 3
Neutrophil %: 42
Neutrophils Absolute: 2.44
Platelets: 11 10*3/uL — AB (ref 150–400)
WBC: 4

## 2022-08-25 LAB — COMPREHENSIVE METABOLIC PANEL
Albumin: 3.2 — AB (ref 3.5–5.0)
Calcium: 8.5 — AB (ref 8.7–10.7)

## 2022-08-25 LAB — HEPATIC FUNCTION PANEL
ALT: 19 U/L (ref 7–35)
AST: 21 (ref 13–35)
Alkaline Phosphatase: 116 (ref 25–125)
Bilirubin, Total: 1.1

## 2022-08-25 LAB — CBC: RBC: 2.42 — AB (ref 3.87–5.11)

## 2022-08-25 NOTE — Progress Notes (Signed)
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  She has had associated anemia requiring regular transfusion.  She was hospitalized in September with sepsis due to pneumonia.  During hospitalization, she developed severe thrombocytopenia, which has not resolved and she has required platelet transfusions as well.  She and her family have decided against any aggressive therapy, so she is on supportive care.  She received 1 platelet and 1 pack  red blood cells last week.  Her counts are even worse today.  Her hemoglobin is down to 6.8 and platelets 11,000.  We will plan to transfuse 2 units of packed red blood cells and 1 unit of platelets this week.  Due to the lower extremity edema, I will plan to give her furosemide with a transfusion.  Her sister expressed her concern over her worsening anemia and thrombocytopenia, so I reiterated that this is the expected course of the disease.  We will continue to transfuse her as long as she is well enough.  I will plan to see her back in 1 week for repeat clinical assessment.  Decubitus ulcer of coccygeal region, stage II ([place]) Persistent decubitus near the coccyx, this hospitalization.  This appears to be grade 2.  It does not appear infected.  I will check on her referral to the wound center for further evaluation and treatment.   The patient understands the plans discussed today and is in agreement with them.  She knows to contact our office if she develops concerns prior to her next appointment.   I provided 20 minutes of face-to-face time during this encounter and > 50% was spent counseling as documented under my assessment and plan.    Brianna Pickles, PA-C  [facility] [address] [place] [address] Dept: [phone] Dept Fax: [phone]   Orders Placed This Encounter  Procedures   CBC and differential    This external order was created through the Results Console.   CBC    This external order was created through the Results Console.   Miscellaneous test (send-out)    Standing Status:   Future    Number of Occurrences:   1    Standing Expiration Date:   08/26/2023    Order Specific Question:   Test name / description:    Answer:   stat urinalysis and urine culture to [facility]   Basic metabolic panel    This external order was created through the Results Console.   Comprehensive metabolic panel    This external order was created through the Results Console.   Hepatic function panel    This external order was created through the Results Console.      CHIEF COMPLAINT:  CC: Myelodysplasia-excess blasts  Current Treatment: 7 quart of care  HISTORY OF PRESENT ILLNESS:   Oncology History  Endometrial adenocarcinoma ([place])  10/19/2016 Cancer Staging   Staging form: Corpus Uteri - Carcinoma, AJCC 7th Edition - Clinical stage from 10/19/2016: FIGO Stage IB (T1b, N0, M0) - Signed by Derwood Kaplan, MD on 09/14/2021 Staged by: Managing physician Diagnostic confirmation: Positive histology Specimen type: Excision Histopathologic type: Adenocarcinoma, endocervical type Stage prefix: Initial diagnosis Laterality: Bilateral Tumor size (mm): 40 Histologic grade (G): G3 Lymph-vascular invasion (  LVI): LVI present/identified, NOS Residual tumor (R): R0 - None Peritoneal cytology results: Negative Pelvic nodal status: Negative Para-aortic status: Negative Stage used in treatment planning: Yes National guidelines used in treatment planning: Yes Type of national guideline used in treatment planning: NCCN   11/03/2016 Initial Diagnosis   Endometrial adenocarcinoma ([place])   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  She denies progressive fatigue concerning for worsening anemia.  She denies shortness of breath lightheadedness. She reports right flank pain, so is concerned she may have a urinary tract infection.  She denies urgency, frequency, or dysuria.  Her sister states there may be slight blood in the urine.  She has persistent pain due to her sacral ulcer.  She has tried sitting on a doughnut, but this is uncomfortable, so she is mainly using pillows.  We talked about spending time lying on her side to relieve some of the pressure.  She also reports bilateral lower extremity edema causing some discomfort.  She is not currently on any diuretic, so we may need to consider this.  She denies fevers or chills. She denies pain. Her appetite is good. Her weight has been stable.   She is able to sit up in her recliner all day, but has limited activity. Her sister states she has not heard from the wound center referral.  REVIEW OF SYSTEMS:  Review of Systems  Constitutional:  Negative for appetite change, chills, fatigue, fever and unexpected weight change.  HENT:   Positive for nosebleeds. Negative for lump/mass, mouth sores and sore throat.   Respiratory:  Negative for cough and shortness of breath.   Cardiovascular:  Negative for chest pain and leg swelling.  Gastrointestinal:  Negative for abdominal pain, constipation, diarrhea, nausea and vomiting.  Endocrine: Negative for hot flashes.  Genitourinary:  Positive for hematuria. Negative for difficulty urinating, dysuria and frequency.   Musculoskeletal:  Positive for back pain and flank pain. Negative for arthralgias and myalgias.  Skin:  Negative for rash.  Neurological:  Positive for headaches. Negative for dizziness.  Hematological:  Negative for adenopathy. Does not bruise/bleed easily.   Psychiatric/Behavioral:  Positive for sleep disturbance. Negative for depression. The patient is not nervous/anxious.      VITALS:  Blood pressure 135/60, pulse (!) 101, temperature 98.4 F (36.9 C), temperature source Oral, resp. rate 18, height 5' 6" (1.676 m), weight 179 lb 4.8 oz (81.3 kg), SpO2 94 %.  Wt Readings from Last 3 Encounters:  08/25/22 179 lb 4.8 oz (81.3 kg)  08/20/22 179 lb 3 oz (81.3 kg)  08/18/22 179 lb 1.6 oz (81.2 kg)    Body mass index is 28.94 kg/m.  Performance status (ECOG): 2 - Symptomatic, <50% confined to bed  PHYSICAL EXAM:  Physical Exam Vitals and nursing note reviewed.  Constitutional:      General: She is not in acute distress.    Appearance: She is ill-appearing (weak appearing, in a wheelchair).  HENT:     Head: Normocephalic and atraumatic.     Mouth/Throat:     Mouth: Mucous membranes are moist.     Pharynx: Oropharynx is clear. No oropharyngeal exudate or posterior oropharyngeal erythema.  Eyes:     General: No scleral icterus.    Extraocular Movements: Extraocular movements intact.     Conjunctiva/sclera: Conjunctivae normal.     Pupils: Pupils are  equal, round, and reactive to light.  Cardiovascular:     Rate and Rhythm: Normal rate and regular rhythm.     Heart sounds: Normal heart sounds. No murmur heard.    No friction rub. No gallop.  Pulmonary:     Effort: Pulmonary effort is normal.     Breath sounds: Normal breath sounds. No wheezing, rhonchi or rales.  Abdominal:     General: There is no distension.     Palpations: Abdomen is soft. There is no hepatomegaly, splenomegaly or mass.     Tenderness: There is no abdominal tenderness.  Musculoskeletal:        General: Normal range of motion.     Cervical back: Normal range of motion and neck supple. No tenderness.     Right lower leg: Edema (1-2+) present.     Left lower leg: Edema (1-2+) present.  Lymphadenopathy:     Cervical: No cervical adenopathy.     Upper Body:      Right upper body: No supraclavicular or axillary adenopathy.     Left upper body: No supraclavicular or axillary adenopathy.  Skin:    General: Skin is warm and dry.     Coloration: Skin is not jaundiced.     Findings: No rash.  Neurological:     Mental Status: She is alert and oriented to person, place, and time.     Cranial Nerves: No cranial nerve deficit.  Psychiatric:        Mood and Affect: Mood normal.        Behavior: Behavior normal.        Thought Content: Thought content normal.     LABS:      Latest Ref Rng & Units 08/25/2022   12:00 AM 08/18/2022   12:00 AM 08/12/2022   12:00 AM  CBC  WBC  4.0     5.1     6.6      Hemoglobin 12.0 - 16.0 6.8     8.0     9.0      Hematocrit 36 - 46 21     24     27      Platelets 150 - 400 K/uL 11     13     23         This result is from an external source.      Latest Ref Rng & Units 08/25/2022   12:00 AM 08/18/2022   12:00 AM 08/12/2022   12:00 AM  CMP  BUN 4 - 21 8     10     16      Creatinine 0.5 - 1.1 0.4     0.4     0.4      Sodium 137 - 147 129     133     133      Potassium 3.5 - 5.1 mEq/L 3.9     4.0     3.5      Chloride 99 - 108 99     97     98      CO2 13 - 22 25     28     27      Calcium 8.7 - 10.7 8.5     8.9     8.9      Alkaline Phos 25 - 125 116     73     93      AST 13 - 35 21       28     22      ALT 7 - 35 U/L _0 This result is from an external source.     No results found for: "CEA1", "CEA" / No results found for: "CEA1", "CEA" No results found for: "PSA1" No results found for: "CAN199" Lab Results  Component Value Date   CAN125 7.2 12/10/2021    Lab Results  Component Value Date   TOTALPROTELP 7.8 06/14/2022   ALBUMINELP 2.5 (L) 06/14/2022   A1GS 0.5 (H) 06/14/2022   A2GS 1.3 (H) 06/14/2022   BETS 1.0 06/14/2022   GAMS 2.4 (H) 06/14/2022   MSPIKE Not Observed 06/14/2022   SPEI Comment 06/14/2022   Lab Results  Component Value Date   TIBC 227 (L) 06/10/2022    FERRITIN 762 (H) 06/10/2022   IRONPCTSAT 12 06/10/2022   Lab Results  Component Value Date   LDH 192 06/09/2022    STUDIES:  No results found.    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[place])    History of radiation therapy 03/02/17-03/16/17   vaginal cuff treated to 18 Gy with 3 fractions of 6 Gy   Hyperglycemia    Labile blood pressure    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[place]) 09/19/2020   Morbid obesity ([place])    Multiple lung nodules on CT 03/14/2018   CT 11/15/16  MPN's largest 6 mm - PET 12/15/16 neg but largest < 30m - CT chest 01/31/18  Largest ?RML proximal/central  @ 1.8 x 1.5 and LLL peripheral cavitary x  1.5 x1.4  - PET 03/22/18 :  Two distinct nodules, def growing since 01/31/18 both hypermetabolic    Phlebitis alone left leg  4-5 yrs ago   Pulmonary embolism on right (HCC)    Redness and swelling of lower leg    [place] malignant [place] of lung [place] 07/18/2019   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) ([place] 01/18/2018    Past Surgical History:  Procedure Laterality Date   APPLICATION OF CRANIAL NAVIGATION N/A 10/08/2020   Procedure: APPLICATION OF CRANIAL NAVIGATION;  Surgeon: TVallarie Mare MD;  [facility]  Service: Neurosurgery;  Laterality: N/A;   CRANIOTOMY N/A 10/08/2020   Procedure: CRANIOTOMY - Suboccipital TUMOR EXCISION - Brain Lab;  Surgeon: TVallarie Mare MD;  [facility]  Service: Neurosurgery;  Laterality: N/A;   suboccipital   ROBOTIC ASSISTED TOTAL HYSTERECTOMY WITH BILATERAL SALPINGO OOPHERECTOMY N/A 11/18/2016   Procedure: XI ROBOTIC ASSISTED TOTAL HYSTERECTOMY WITH BILATERAL SALPINGO OOPHORECTOMY;  Surgeon: EEveritt Amber MD;  Location: [facility];  Service: Gynecology;  Laterality: N/A;   SENTINEL NODE BIOPSY N/A 11/18/2016   Procedure: SENTINEL NODE BIOPSY;  Surgeon: EEveritt Amber MD;  Location: [facility];  Service: Gynecology;  Laterality: N/A;    Family History  Problem Relation Age of Onset   Atrial fibrillation Mother  Lung cancer Father    Breast cancer Sister    Heart failure Sister     Social History:  reports that she has never smoked. She has never used smokeless tobacco. She reports that she does not drink alcohol and does not use drugs.The patient is accompanied by her sister, Harmon Pier, today.  Allergies: No Known Allergies  Current Medications: Current Outpatient Medications  Medication Sig Dispense Refill   acetaminophen (TYLENOL) 500 MG tablet Take 500 mg by mouth every 6 (six) hours as needed for pain or headache.     amiodarone (PACERONE) 200 MG tablet Take 1 tablet (200 mg total) by mouth daily. 90 tablet 3   Calcium Carbonate (CALTRATE 600 PO) Take 1 tablet by mouth in the morning and at bedtime.     fentaNYL (DURAGESIC) 12 MCG/HR Place 1 patch onto the skin every 3 (three) days. 5 patch 0   HYDROcodone-acetaminophen (NORCO/VICODIN) 5-325 MG tablet Take 1 tablet by mouth every 4 (four) hours as needed. 100 tablet 0   LANTUS SOLOSTAR 100 UNIT/ML Solostar Pen SMARTSIG:10 Unit(s) SUB-Q Daily     LORazepam (ATIVAN) 0.5 MG tablet Take 1 tablet (0.5 mg total) by mouth every 6 (six) hours as needed for anxiety. 30 tablet 0   metoprolol succinate (TOPROL-XL) 25 MG 24 hr tablet Take 1 tablet (25 mg total) by mouth 2 (two) times daily. 180 tablet 3   Multiple Vitamin (MULTIVITAMIN WITH MINERALS) TABS tablet Take 1 tablet by mouth daily. Centrum Silver     ondansetron (ZOFRAN) 4 MG tablet Take 1  tablet (4 mg total) by mouth every 4 (four) hours as needed for nausea. 90 tablet 3   potassium chloride 20 MEQ/15ML (10%) SOLN Take 20 mEq by mouth daily.     Probiotic Product (PROBIOTIC PO) Take 1 capsule by mouth daily.     promethazine-dextromethorphan (PROMETHAZINE-DM) 6.25-15 MG/5ML syrup Take 5 mLs by mouth 4 (four) times daily as needed for cough. 118 mL 0   No current facility-administered medications for this visit.

## 2022-08-25 NOTE — Assessment & Plan Note (Signed)
Persistent decubitus near the coccyx, this hospitalization.  This appears to be grade 2.  It does not appear infected.  I will check on her referral to the wound center for further evaluation and treatment.

## 2022-08-25 NOTE — Telephone Encounter (Signed)
-----   Message from [name], PA-C sent at 08/25/2022  3:42 PM EDT ----- Please let her sister know that the urine looks clear, so we will wait on the culture before starting any treatment. Thanks

## 2022-08-25 NOTE — Telephone Encounter (Signed)
Patients sister made aware.

## 2022-08-25 NOTE — Progress Notes (Signed)
CRITICAL VALUE STICKER  CRITICAL VALUE:   Hgb 6.8; Plt 11  RECEIVER (on-site recipient of call):  Monzerrat Wellen D., RN  DATE & TIME NOTIFIED:  08/25/2022  @ 1346  MESSENGER (representative from lab):  Scottie  MD NOTIFIED:   Zachery Dauer., PA   TIME OF NOTIFICATION:  6945  RESPONSE:   Zachery Dauer, PA to scheduled to see patient now.  Two units and 1 platelet to be transfuse 08/27/2022 @ [address].

## 2022-08-25 NOTE — Assessment & Plan Note (Addendum)
Newly diagnosed myelodysplasia-excess blasts.  She has had associated anemia requiring regular transfusion.  She was hospitalized in September with sepsis due to pneumonia.  During hospitalization, she developed severe thrombocytopenia, which has not resolved and she has required platelet transfusions as well.  She and her family have decided against any aggressive therapy, so she is on supportive care.  She received 1 platelet and 1 pack  red blood cells last week.  Her counts are even worse today.  Her hemoglobin is down to 6.8 and platelets 11,000.  We will plan to transfuse 2 units of packed red blood cells and 1 unit of platelets this week.  Due to the lower extremity edema, I will plan to give her furosemide with a transfusion.  Her sister expressed her concern over her worsening anemia and thrombocytopenia, so I reiterated that this is the expected course of the disease.  We will continue to transfuse her as long as she is well enough.  I will plan to see her back in 1 week for repeat clinical assessment.

## 2022-08-26 LAB — PREPARE RBC (CROSSMATCH)

## 2022-08-27 ENCOUNTER — Inpatient Hospital Stay: Payer: Medicare Other

## 2022-08-27 VITALS — BP 99/54 | HR 88 | Temp 98.2°F | Resp 18

## 2022-08-27 DIAGNOSIS — D469 Myelodysplastic syndrome, unspecified: Secondary | ICD-10-CM | POA: Diagnosis not present

## 2022-08-27 DIAGNOSIS — Z23 Encounter for immunization: Secondary | ICD-10-CM

## 2022-08-27 MED ORDER — INFLUENZA VAC SPLIT QUAD 0.5 ML IM SUSY
0.5000 mL | PREFILLED_SYRINGE | Freq: Once | INTRAMUSCULAR | Status: AC
  Administered 2022-08-27: 0.5 mL via INTRAMUSCULAR
  Filled 2022-08-27: qty 0.5

## 2022-08-27 MED ORDER — FUROSEMIDE 10 MG/ML IJ SOLN
20.0000 mg | Freq: Once | INTRAMUSCULAR | Status: AC
  Administered 2022-08-27: 20 mg via INTRAVENOUS
  Filled 2022-08-27: qty 2

## 2022-08-27 MED ORDER — SODIUM CHLORIDE 0.9% FLUSH: 3.0000 mL | INTRAVENOUS | Status: DC | PRN

## 2022-08-27 MED ORDER — SODIUM CHLORIDE 0.9% IV SOLUTION
250.0000 mL | Freq: Once | INTRAVENOUS | Status: AC
  Administered 2022-08-27: 250 mL via INTRAVENOUS

## 2022-08-27 MED ORDER — ACETAMINOPHEN 325 MG PO TABS
650.0000 mg | ORAL_TABLET | Freq: Once | ORAL | Status: DC
  Filled 2022-08-27: qty 2

## 2022-08-27 MED ORDER — HEPARIN SOD (PORK) LOCK FLUSH 100 UNIT/ML IV SOLN
500.0000 [IU] | Freq: Every day | INTRAVENOUS | Status: AC | PRN
  Administered 2022-08-27: 500 [IU]

## 2022-08-27 MED ORDER — DIPHENHYDRAMINE HCL 25 MG PO CAPS
25.0000 mg | ORAL_CAPSULE | Freq: Once | ORAL | Status: DC
  Filled 2022-08-27: qty 1

## 2022-08-27 MED ORDER — SODIUM CHLORIDE 0.9% FLUSH
10.0000 mL | INTRAVENOUS | Status: AC | PRN
  Administered 2022-08-27: 10 mL

## 2022-08-27 NOTE — Patient Instructions (Signed)
Influenza (Flu) Vaccine (Inactivated or Recombinant): What You Need to Know 1. Why get vaccinated? Influenza vaccine can prevent influenza (flu). Flu is a contagious disease that spreads around the [place] every year, usually between October and May. Anyone can get the flu, but it is more dangerous for some people. Infants and young children, people 65 years and older, pregnant people, and people with certain health conditions or a weakened immune system are at greatest risk of flu complications. Pneumonia, bronchitis, sinus infections, and ear infections are examples of flu-related complications. If you have a medical condition, such as heart disease, cancer, or diabetes, flu can make it worse. Flu can cause fever and chills, sore throat, muscle aches, fatigue, cough, headache, and runny or stuffy nose. Some people may have vomiting and diarrhea, though this is more common in children than adults. In an average year, thousands of people in the [place] die from flu, and many more are hospitalized. Flu vaccine prevents millions of illnesses and flu-related visits to the doctor each year. 2. Influenza vaccines CDC recommends everyone 6 months and older get vaccinated every flu season. Children 6 months through 8 years of age may need 2 doses during a single flu season. Everyone else needs only 1 dose each flu season. It takes about 2 weeks for protection to develop after vaccination. There are many flu viruses, and they are always changing. Each year a new flu vaccine is made to protect against the influenza viruses believed to be likely to cause disease in the upcoming flu season. Even when the vaccine doesn't exactly match these viruses, it may still provide some protection. Influenza vaccine does not cause flu. Influenza vaccine may be given at the same time as other vaccines. 3. Talk with your health care provider Tell your vaccination provider if the person getting the vaccine: Has had  an allergic reaction after a previous dose of influenza vaccine, or has any severe, life-threatening allergies Has ever had Guillain-Barr Syndrome (also called "GBS") In some cases, your health care provider may decide to postpone influenza vaccination until a future visit. Influenza vaccine can be administered at any time during pregnancy. People who are or will be pregnant during influenza season should receive inactivated influenza vaccine. People with minor illnesses, such as a cold, may be vaccinated. People who are moderately or severely ill should usually wait until they recover before getting influenza vaccine. Your health care provider can give you more information. 4. Risks of a vaccine reaction Soreness, redness, and swelling where the shot is given, fever, muscle aches, and headache can happen after influenza vaccination. There may be a very small increased risk of Guillain-Barr Syndrome (GBS) after inactivated influenza vaccine (the flu shot). Young children who get the flu shot along with pneumococcal vaccine (PCV13) and/or DTaP vaccine at the same time might be slightly more likely to have a seizure caused by fever. Tell your health care provider if a child who is getting flu vaccine has ever had a seizure. People sometimes faint after medical procedures, including vaccination. Tell your provider if you feel dizzy or have vision changes or ringing in the ears. As with any medicine, there is a very remote chance of a vaccine causing a severe allergic reaction, other serious injury, or death. 5. What if there is a serious problem? An allergic reaction could occur after the vaccinated person leaves the clinic. If you see signs of a severe allergic reaction (hives, swelling of the face and throat, difficulty breathing,   a fast heartbeat, dizziness, or weakness), call 9-1-1 and get the person to the nearest hospital. For other signs that concern you, call your health care provider. Adverse  reactions should be reported to the Vaccine Adverse Event Reporting System (VAERS). Your health care provider will usually file this report, or you can do it yourself. Visit the VAERS website at www.vaers.SamedayNews.es or call 718-260-4865. VAERS is only for reporting reactions, and VAERS staff members do not give medical advice. 6. The National Vaccine Injury Compensation Program The [employer] Vaccine Injury Compensation Program (VICP) is a federal program that was created to compensate people who may have been injured by certain vaccines. Claims regarding alleged injury or death due to vaccination have a time limit for filing, which may be as short as two years. Visit the VICP website at GoldCloset.com.ee or call 705-474-9815 to learn about the program and about filing a claim. 7. How can I learn more? Ask your health care provider. Call your local or state health department. Visit the website of the Food and Drug Administration (FDA) for vaccine package inserts and additional information at TraderRating.uy. Contact the Centers for Disease Control and Prevention (CDC): Call 850-870-4567 (1-800-CDC-INFO) or Visit CDC's website at https://gibson.com/. Source: CDC Vaccine Information Statement Inactivated Influenza Vaccine (06/13/2020) This same material is available at http://www.wolf.info/ for no charge. This information is not intended to replace advice given to you by your health care provider. Make sure you discuss any questions you have with your health care provider. Document Revised: 09/23/2021 Document Reviewed: 07/16/2021 Elsevier Patient Education  [place].

## 2022-08-27 NOTE — Progress Notes (Signed)
New NS bag and tubing hung with platelets

## 2022-08-30 ENCOUNTER — Other Ambulatory Visit: Payer: Self-pay | Admitting: Hematology and Oncology

## 2022-08-30 ENCOUNTER — Telehealth: Payer: Self-pay

## 2022-08-30 LAB — BPAM RBC
Blood Product Expiration Date: 202311182359
Blood Product Expiration Date: 202311182359
ISSUE DATE / TIME: 202310200724
ISSUE DATE / TIME: 202310200724
Unit Type and Rh: 5100
Unit Type and Rh: 5100

## 2022-08-30 LAB — BPAM PLATELET PHERESIS
Blood Product Expiration Date: 202310232359
ISSUE DATE / TIME: 202310200731
Unit Type and Rh: 6200

## 2022-08-30 LAB — TYPE AND SCREEN
ABO/RH(D): O POS
Antibody Screen: NEGATIVE
Unit division: 0
Unit division: 0

## 2022-08-30 LAB — PREPARE PLATELET PHERESIS: Unit division: 0

## 2022-08-30 MED ORDER — AMPICILLIN 500 MG PO CAPS: 500.0000 mg | ORAL_CAPSULE | Freq: Four times a day (QID) | ORAL | 0 refills | Status: DC

## 2022-08-30 NOTE — Telephone Encounter (Signed)
-----   Message from [name], PA-C sent at 08/30/2022  8:07 AM EDT ----- Please let her sister Brianna Herring know that her urine culture did show infection. I sent in ampicillin 4 times daily to [facility]. Thank you

## 2022-08-30 NOTE — Telephone Encounter (Signed)
Ava notified and voiced understanding.

## 2022-09-02 ENCOUNTER — Encounter: Payer: Self-pay | Admitting: Hematology and Oncology

## 2022-09-02 ENCOUNTER — Telehealth: Payer: Self-pay

## 2022-09-02 ENCOUNTER — Inpatient Hospital Stay (INDEPENDENT_AMBULATORY_CARE_PROVIDER_SITE_OTHER): Payer: Medicare Other | Admitting: Hematology and Oncology

## 2022-09-02 ENCOUNTER — Inpatient Hospital Stay: Payer: Medicare Other

## 2022-09-02 DIAGNOSIS — D469 Myelodysplastic syndrome, unspecified: Secondary | ICD-10-CM | POA: Diagnosis not present

## 2022-09-02 DIAGNOSIS — N390 Urinary tract infection, site not specified: Secondary | ICD-10-CM

## 2022-09-02 DIAGNOSIS — B952 Enterococcus as the cause of diseases classified elsewhere: Secondary | ICD-10-CM

## 2022-09-02 DIAGNOSIS — L03115 Cellulitis of right lower limb: Secondary | ICD-10-CM | POA: Diagnosis not present

## 2022-09-02 DIAGNOSIS — C7801 Secondary malignant neoplasm of right lung: Secondary | ICD-10-CM

## 2022-09-02 LAB — COMPREHENSIVE METABOLIC PANEL
Albumin: 3.5 (ref 3.5–5.0)
Calcium: 8.8 (ref 8.7–10.7)

## 2022-09-02 LAB — CBC AND DIFFERENTIAL
HCT: 28 — AB (ref 36–46)
Hemoglobin: 9.3 — AB (ref 12.0–16.0)
MCV: 84 (ref 81–99)
Neutrophils Absolute: 4.6
Platelets: 20 10*3/uL — AB (ref 150–400)
WBC: 7.3

## 2022-09-02 LAB — BASIC METABOLIC PANEL
BUN: 8 (ref 4–21)
CO2: 28 — AB (ref 13–22)
Chloride: 98 — AB (ref 99–108)
Creatinine: 0.5 (ref 0.5–1.1)
Glucose: 172
Potassium: 4.1 mEq/L (ref 3.5–5.1)
Sodium: 134 — AB (ref 137–147)

## 2022-09-02 LAB — HEPATIC FUNCTION PANEL
ALT: 15 U/L (ref 7–35)
AST: 24 (ref 13–35)
Alkaline Phosphatase: 110 (ref 25–125)
Bilirubin, Total: 1.1

## 2022-09-02 LAB — CBC: RBC: 3.33 — AB (ref 3.87–5.11)

## 2022-09-02 MED ORDER — PROMETHAZINE-DM 6.25-15 MG/5ML PO SYRP: 5.0000 mL | ORAL_SOLUTION | Freq: Four times a day (QID) | ORAL | 0 refills | Status: AC | PRN

## 2022-09-02 MED ORDER — MORPHINE SULFATE (CONCENTRATE) 20 MG/ML PO SOLN: 10.0000 mg | ORAL | 0 refills | Status: AC | PRN

## 2022-09-02 MED ORDER — CEPHALEXIN 500 MG PO CAPS: 500.0000 mg | ORAL_CAPSULE | Freq: Two times a day (BID) | ORAL | 0 refills | Status: AC

## 2022-09-02 NOTE — Progress Notes (Signed)
Brianna Herring  [address] [place],  [place]  [address] [phone]  Clinic Day:  09/02/2022  Referring physician: Venetia Maxon, [name], *  ASSESSMENT & PLAN:   Assessment & Plan: Myelodysplasia (myelodysplastic syndrome) ([place]) Myelodysplasia-excess blasts.  She has had associated anemia requiring regular transfusion.  She was hospitalized in September with sepsis due to pneumonia.  During hospitalization, she developed severe thrombocytopenia, which has not resolved and she has required platelet transfusions as well.  She and her family have decided against any aggressive therapy, so she is on supportive care.  She received 1 platelet and 1 pack  red blood cells last week.  Her counts are better this week with a hemoglobin of 9.3 and platelets 20,000.  She does not require blood products this week.  She is having severe leg pain despite fentanyl 12 mcg/hr with hydrocodone/APAP for breakthrough.  She also has pain from the sacral decubitus.  She is trying to change positions as much as possible.  I added a second fentanyl 12 mcg/h patch in clinic today.  I will discontinue the hydrocodone/APAP and start her on morphine sulfate liquid 20 mg/mL, 10 mg every 4 hours as needed.  We will plan to see her back in 1 week for repeat clinical assessment.  Cellulitis of right leg Right lower extremity cellulitis.  She is already on ampicillin for Enterococcus faecalis in the urine, but I do not think this is good enough coverage we will add cephalexin.  If she has difficulty with this, her sister will contact us.  UTI (urinary tract infection) due to Enterococcus Enterococcus faecalis, for which she is on ampicillin.   The patient understands the plans discussed today and is in agreement with them.  She knows to contact our office if she develops concerns prior to her next appointment.   I provided 40 minutes of face-to-face time during this encounter and > 50%  was spent counseling as documented under my assessment and plan.    [name], PA-C  [facility] [address] [place] [address] Dept: [phone] Dept Fax: [phone]   Orders Placed This Encounter  Procedures   CBC and differential    This external order was created through the Results Console.   CBC    This external order was created through the Results Console.   Basic metabolic panel    This external order was created through the Results Console.   Comprehensive metabolic panel    This external order was created through the Results Console.   Hepatic function panel    This external order was created through the Results Console.      CHIEF COMPLAINT:  CC: Myelodysplasia-excess blasts  Current Treatment:  Supportive care  HISTORY OF PRESENT ILLNESS:   Oncology History  Endometrial adenocarcinoma ([place])  10/19/2016 Cancer Staging   Staging form: Corpus Uteri - Carcinoma, AJCC 7th Edition - Clinical stage from 10/19/2016: FIGO Stage IB (T1b, N0, M0) - Signed by Derwood Kaplan, MD on 09/14/2021 Staged by: Managing physician Diagnostic confirmation: Positive histology Specimen type: Excision Histopathologic type: Adenocarcinoma, endocervical type Stage prefix: Initial diagnosis Laterality: Bilateral Tumor size (mm): 40 Histologic grade (G): G3 Lymph-vascular invasion (LVI): LVI present/identified, NOS Residual tumor (R): R0 - None Peritoneal cytology results: Negative Pelvic nodal status: Negative Para-aortic status: Negative Stage used in treatment planning: Yes National guidelines used in treatment planning: Yes Type of national guideline used in  treatment planning: NCCN   11/03/2016 Initial Diagnosis   Endometrial adenocarcinoma ([place])   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)       INTERVAL HISTORY:  Brianna Herring is  here today for repeat clinical assessment. Unfortunately, she is having severe leg pain despite fentanyl 12 mcg/hr with hydrocodone/APAP for breakthrough. Her right leg is more swollen and red. She denies bleeding.  Her sister states she had a temperature of 102 the other day, which resolved with Tylenol.  Her appetite is good. Her weight has been stable.  Her sister states she canceled the appointment with wound care to manage the sacral decubitus, as palliative care services were coming to the house.  We will ask palliative care to manage this.  REVIEW OF SYSTEMS:  Review of Systems  Constitutional:  Positive for fatigue and fever (Single episode without recurrence). Negative for appetite change, chills and unexpected weight change.  HENT:   Positive for trouble swallowing (improving). Negative for lump/mass, mouth sores, nosebleeds and sore throat.   Respiratory:  Positive for cough. Negative for shortness of breath.   Cardiovascular:  Negative for chest pain and leg swelling.  Gastrointestinal:  Negative for abdominal pain, blood in stool, constipation, diarrhea, nausea and vomiting.  Endocrine: Negative for hot flashes.  Genitourinary:  Negative for difficulty urinating, dysuria, frequency and hematuria.   Musculoskeletal:  Positive for back pain (Back and bilateral legs). Negative for arthralgias and myalgias.  Skin:  Positive for rash (Bilateral lower legs).  Neurological:  Negative for dizziness and headaches.  Hematological:  Negative for adenopathy. Does not bruise/bleed easily.  Psychiatric/Behavioral:  Negative for depression and sleep disturbance. The patient is not nervous/anxious.      VITALS:  Blood pressure (!) 115/58, pulse (!) 110, temperature 97.6 F (36.4 C), temperature source Oral, resp. rate 18, height _0  (1.676 m), weight 179 lb (81.2 kg), SpO2 95 %.  Wt Readings from Last 3 Encounters:  09/02/22 179 lb (81.2 kg)  08/25/22 179 lb 4.8 oz (81.3 kg)  08/20/22 179 lb 3  oz (81.3 kg)    Body mass index is 28.89 kg/m.  Performance status (ECOG): 3 - Symptomatic, >50% confined to bed  PHYSICAL EXAM:  Physical Exam Vitals and nursing note reviewed.  Constitutional:      General: She is not in acute distress.    Appearance: Normal appearance.  HENT:     Head: Normocephalic and atraumatic.     Mouth/Throat:     Mouth: Mucous membranes are moist.     Pharynx: Oropharynx is clear. No oropharyngeal exudate or posterior oropharyngeal erythema.  Eyes:     General: No scleral icterus.    Extraocular Movements: Extraocular movements intact.     Conjunctiva/sclera: Conjunctivae normal.     Pupils: Pupils are equal, round, and reactive to light.  Cardiovascular:     Rate and Rhythm: Normal rate and regular rhythm.     Heart sounds: Normal heart sounds. No murmur heard.    No friction rub. No gallop.  Pulmonary:     Effort: Pulmonary effort is normal.     Breath sounds: Normal breath sounds. No wheezing, rhonchi or rales.  Abdominal:     General: There is no distension.     Palpations: Abdomen is soft. There is no hepatomegaly, splenomegaly or mass.     Tenderness: There is no abdominal tenderness.  Musculoskeletal:        General: Normal range of motion.  Cervical back: Normal range of motion and neck supple. No tenderness.     Right lower leg: Edema (1+) present.     Left lower leg: No edema.  Lymphadenopathy:     Cervical: No cervical adenopathy.     Upper Body:     Right upper body: No supraclavicular or axillary adenopathy.     Left upper body: No supraclavicular or axillary adenopathy.  Skin:    General: Skin is warm and dry.     Coloration: Skin is not jaundiced.     Findings: Erythema (Distal right lower extremity with overlying warmth and tenderness) present. No rash.  Neurological:     Mental Status: She is alert and oriented to person, place, and time.     Cranial Nerves: No cranial nerve deficit.  Psychiatric:        Mood and  Affect: Mood normal.        Behavior: Behavior normal.        Thought Content: Thought content normal.    LABS:      Latest Ref Rng & Units 09/02/2022   12:00 AM 08/25/2022   12:00 AM 08/18/2022   12:00 AM  CBC  WBC  7.3     4.0     5.1      Hemoglobin 12.0 - 16.0 9.3     6.8     8.0      Hematocrit 36 - 46 _0 Platelets 150 - 400 K/uL _1 This result is from an external source.      Latest Ref Rng & Units 09/02/2022   12:00 AM 08/25/2022   12:00 AM 08/18/2022   12:00 AM  CMP  BUN 4 - _2 Creatinine 0.5 - 1.1 0.5     0.4     0.4      Sodium 137 - 147 134     129     133      Potassium 3.5 - 5.1 mEq/L 4.1     3.9     4.0      Chloride 99 - 108 98     99     97      CO2 13 - _3 Calcium 8.7 - 10.7 8.8     8.5     8.9      Alkaline Phos 25 - 125 110     116     73      AST 13 - 35 _4 ALT 7 - 35 U/L _5 This result is from an external source.     No results found for: "CEA1", "CEA" / No results found for: "CEA1", "CEA" No results found for: "PSA1" No results found for: "CAN199" Lab Results  Component Value Date   CAN125 7.2 12/10/2021    Lab Results  Component Value Date   TOTALPROTELP 7.8 06/14/2022   ALBUMINELP 2.5 (L) 06/14/2022   A1GS 0.5 (H) 06/14/2022  A2GS 1.3 (H) 06/14/2022   BETS 1.0 06/14/2022   GAMS 2.4 (H) 06/14/2022   MSPIKE Not Observed 06/14/2022   SPEI Comment 06/14/2022   Lab Results  Component Value Date   TIBC 227 (L) 06/10/2022   FERRITIN 762 (H) 06/10/2022   IRONPCTSAT 12 06/10/2022   Lab Results  Component Value Date   LDH 192 06/09/2022    STUDIES:  No results found.    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[place])    History of radiation therapy 03/02/17-03/16/17   vaginal cuff treated to 18 Gy with 3 fractions of 6 Gy   Hyperglycemia    Labile blood pressure    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[place]) 09/19/2020   Morbid obesity ([place])    Multiple lung nodules on CT 03/14/2018   CT 11/15/16  MPN's largest 6 mm - PET 12/15/16 neg but largest < 45m - CT chest 01/31/18  Largest ?RML proximal/central  @ 1.8 x 1.5 and LLL peripheral cavitary x  1.5 x1.4  - PET 03/22/18 :  Two distinct nodules, def growing since 01/31/18 both hypermetabolic    Phlebitis alone left leg  4-5 yrs ago   Pulmonary embolism on right ([facility])    Redness and swelling of lower leg    Secondary malignant neoplasm of lung [place] 07/18/2019   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) ([facility] 01/18/2018    Past Surgical History:  Procedure Laterality Date   APPLICATION OF CRANIAL NAVIGATION N/A 10/08/2020   Procedure: APPLICATION OF CRANIAL NAVIGATION;  Surgeon: TVallarie Mare MD;  [facility]  Service: Neurosurgery;  Laterality: N/A;   CRANIOTOMY N/A 10/08/2020   Procedure: CRANIOTOMY - Suboccipital TUMOR EXCISION - Brain Lab;  Surgeon: TVallarie Mare MD;  [facility]  Service: Neurosurgery;  Laterality: N/A;  suboccipital   ROBOTIC ASSISTED TOTAL HYSTERECTOMY WITH BILATERAL SALPINGO OOPHERECTOMY N/A 11/18/2016   Procedure: XI ROBOTIC ASSISTED TOTAL HYSTERECTOMY WITH BILATERAL SALPINGO OOPHORECTOMY;  Surgeon: EEveritt Amber MD;  Location: [facility];  Service: Gynecology;  Laterality: N/A;   SENTINEL NODE BIOPSY N/A 11/18/2016   Procedure: SENTINEL NODE BIOPSY;  Surgeon:  EEveritt Amber MD;  Location: [facility];  Service: Gynecology;  Laterality: N/A;    Family History  Problem Relation Age of Onset   Atrial fibrillation Mother    Lung cancer Father    Breast cancer Sister    Heart failure Sister     Social History:  reports that she has never smoked. She has never used smokeless tobacco. She reports that she does not drink alcohol and does not use drugs.The patient is accompanied by her sister Ava today.  Allergies: No Known Allergies  Current Medications: Current Outpatient Medications  Medication Sig Dispense Refill   cephALEXin (KEFLEX) 500 MG capsule Take 1 capsule (500 mg total) by mouth 2 (two) times daily. 20 capsule 0   morphine (ROXANOL) 20 MG/ML concentrated solution  Take 0.5 mLs (10 mg total) by mouth every 4 (four) hours as needed for severe pain. 30 mL 0   acetaminophen (TYLENOL) 500 MG tablet Take 500 mg by mouth every 6 (six) hours as needed for pain or headache.     amiodarone (PACERONE) 200 MG tablet Take 1 tablet (200 mg total) by mouth daily. 90 tablet 3   ampicillin (PRINCIPEN) 500 MG capsule Take 1 capsule (500 mg total) by mouth 4 (four) times daily. 40 capsule 0   Calcium Carbonate (CALTRATE 600 PO) Take 1 tablet by mouth in the morning and at bedtime.     fentaNYL (DURAGESIC) 12 MCG/HR Place 1 patch onto the skin every 3 (three) days. 5 patch 0   LANTUS SOLOSTAR 100 UNIT/ML Solostar Pen SMARTSIG:10 Unit(s) SUB-Q Daily (Patient not taking: Reported on 09/02/2022)     LORazepam (ATIVAN) 0.5 MG tablet Take 1 tablet (0.5 mg total) by mouth every 6 (six) hours as needed for anxiety. 30 tablet 0   metoprolol succinate (TOPROL-XL) 25 MG 24 hr tablet Take 1 tablet (25 mg total) by mouth 2 (two) times daily. 180 tablet 3   Multiple Vitamin (MULTIVITAMIN WITH MINERALS) TABS tablet Take 1 tablet by mouth daily. Centrum Silver     ondansetron (ZOFRAN) 4 MG tablet Take 1 tablet (4 mg total) by mouth every 4 (four) hours as needed for nausea. 90  tablet 3   potassium chloride 20 MEQ/15ML (10%) SOLN Take 20 mEq by mouth daily.     Probiotic Product (PROBIOTIC PO) Take 1 capsule by mouth daily.     promethazine-dextromethorphan (PROMETHAZINE-DM) 6.25-15 MG/5ML syrup Take 5 mLs by mouth 4 (four) times daily as needed for cough. 118 mL 0   No current facility-administered medications for this visit.

## 2022-09-02 NOTE — Telephone Encounter (Signed)
-----   Message from [name], PA-C sent at 09/02/2022 12:14 PM EDT ----- [facility] and see if they can send someone more often? She has uncontrolled pain, increase fentanyl 12 mcg/hr to 2 patches and gave her Roxanol. Also, I think she has cellulitis of the right lower extremity, so add Keflex twice daily. Also, would like assessment and treatment of sacral decubitus. Thank you.

## 2022-09-02 NOTE — Assessment & Plan Note (Signed)
Enterococcus faecalis, for which she is on ampicillin.

## 2022-09-02 NOTE — Telephone Encounter (Signed)
Spoke with Jeanne Ivan director of [facility] palliative care. They will go out more often for a few weeks. To assess pain, check lower extremities and assess and treat decubitus.They will check todays note in Epic and Dr Lillia Mountain note after upcomming appt November 2nd.

## 2022-09-02 NOTE — Assessment & Plan Note (Signed)
Myelodysplasia-excess blasts.  She has had associated anemia requiring regular transfusion.  She was hospitalized in September with sepsis due to pneumonia.  During hospitalization, she developed severe thrombocytopenia, which has not resolved and she has required platelet transfusions as well.  She and her family have decided against any aggressive therapy, so she is on supportive care.  She received 1 platelet and 1 pack  red blood cells last week.  Her counts are better this week with a hemoglobin of 9.3 and platelets 20,000.  She does not require blood products this week.  She is having severe leg pain despite fentanyl 12 mcg/hr with hydrocodone/APAP for breakthrough.  She also has pain from the sacral decubitus.  She is trying to change positions as much as possible.  I added a second fentanyl 12 mcg/h patch in clinic today.  I will discontinue the hydrocodone/APAP and start her on morphine sulfate liquid 20 mg/mL, 10 mg every 4 hours as needed.  We will plan to see her back in 1 week for repeat clinical assessment.

## 2022-09-02 NOTE — Assessment & Plan Note (Signed)
Right lower extremity cellulitis.  She is already on ampicillin for Enterococcus faecalis in the urine, but I do not think this is good enough coverage we will add cephalexin.  If she has difficulty with this, her sister will contact us.

## 2022-09-08 ENCOUNTER — Ambulatory Visit: Payer: Medicare Other | Admitting: Hematology and Oncology

## 2022-09-08 ENCOUNTER — Other Ambulatory Visit: Payer: Medicare Other

## 2022-09-09 ENCOUNTER — Other Ambulatory Visit: Payer: Self-pay | Admitting: Oncology

## 2022-09-09 ENCOUNTER — Encounter: Payer: Self-pay | Admitting: Oncology

## 2022-09-09 ENCOUNTER — Inpatient Hospital Stay: Payer: Medicare Other | Attending: Internal Medicine

## 2022-09-09 ENCOUNTER — Other Ambulatory Visit: Payer: Self-pay | Admitting: Hematology and Oncology

## 2022-09-09 ENCOUNTER — Inpatient Hospital Stay (INDEPENDENT_AMBULATORY_CARE_PROVIDER_SITE_OTHER): Payer: Medicare Other | Admitting: Oncology

## 2022-09-09 VITALS — BP 114/63 | HR 131 | Temp 97.6°F | Resp 20 | Ht 66.0 in | Wt 177.4 lb

## 2022-09-09 DIAGNOSIS — D469 Myelodysplastic syndrome, unspecified: Secondary | ICD-10-CM

## 2022-09-09 DIAGNOSIS — D649 Anemia, unspecified: Secondary | ICD-10-CM | POA: Diagnosis not present

## 2022-09-09 DIAGNOSIS — D696 Thrombocytopenia, unspecified: Secondary | ICD-10-CM | POA: Diagnosis not present

## 2022-09-09 LAB — BASIC METABOLIC PANEL
BUN: 10 (ref 4–21)
CO2: 23 — AB (ref 13–22)
Chloride: 98 — AB (ref 99–108)
Creatinine: 0.5 (ref 0.5–1.1)
Glucose: 150
Potassium: 4.4 mEq/L (ref 3.5–5.1)
Sodium: 129 — AB (ref 137–147)

## 2022-09-09 LAB — HEPATIC FUNCTION PANEL
ALT: 18 U/L (ref 7–35)
AST: 27 (ref 13–35)
Alkaline Phosphatase: 123 (ref 25–125)
Bilirubin, Total: 1.5

## 2022-09-09 LAB — PREPARE RBC (CROSSMATCH)

## 2022-09-09 LAB — COMPREHENSIVE METABOLIC PANEL
Albumin: 3.5 (ref 3.5–5.0)
Calcium: 9 (ref 8.7–10.7)

## 2022-09-09 LAB — CBC AND DIFFERENTIAL
HCT: 23 — AB (ref 36–46)
Hemoglobin: 7.5 — AB (ref 12.0–16.0)
Neutrophils Absolute: 4.03
Platelets: 13 10*3/uL — AB (ref 150–400)
WBC: 14.4

## 2022-09-09 LAB — CBC: RBC: 2.74 — AB (ref 3.87–5.11)

## 2022-09-09 MED ORDER — FENTANYL 25 MCG/HR TD PT72: 1.0000 | MEDICATED_PATCH | TRANSDERMAL | 0 refills | Status: AC

## 2022-09-09 NOTE — Progress Notes (Signed)
Brianna Herring  [address] 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  She has had associated anemia requiring regular transfusion.  She was hospitalized in September with sepsis due to pneumonia.  During hospitalization, she developed severe thrombocytopenia, which has not resolved and she has required platelet transfusions as well. She and her family have decided against any aggressive therapy, so she is on supportive care and is receiving frequent transfusions.  She is having severe leg pain despite fentanyl, increased to 25 mcg with morphine sulfate liquid 20 mg/mL, 10 mg every 4 hours as needed for breakthrough.  She also has pain from the sacral decubitus.  She is trying to change positions as much as possible.     Cellulitis of right leg Right lower extremity cellulitis.  She is already on ampicillin for Enterococcus faecalis in the urine, but I do not think this is good enough coverage we will add cephalexin.  If she has difficulty with this, her sister will contact us.   UTI (urinary tract infection) due to Enterococcus Enterococcus faecalis, for which she is on ampicillin.    We will plan to see her weekly with labs weekly and transfuse as needed. The patient understands the plans discussed today and is in agreement with them.  She knows to contact our office if she develops concerns prior to her next appointment.  I provided 40 minutes of face-to-face time during this encounter and > 50% was spent counseling as documented under my assessment and plan.    Derwood Kaplan, MD  [facility] AT [facility] [address] [place] [address] Dept: [phone] Dept Fax: [phone]   Orders Placed This  Encounter  Procedures   CBC and differential    This external order was created through the Results Console.   CBC    This external order was created through the Results Console.   Basic metabolic panel    This external order was created through the Results Console.   Comprehensive metabolic panel    This external order was created through the Results Console.   Hepatic function panel    This external order was created through the Results Console.   BLOOD TRANSFUSION REPORT - SCANNED      CHIEF COMPLAINT:  CC: Myelodysplasia-excess blasts  Current Treatment:  Supportive care  HISTORY OF PRESENT ILLNESS:   Oncology History  Endometrial adenocarcinoma ([place])  10/19/2016 Cancer Staging   Staging form: Corpus Uteri - Carcinoma, AJCC 7th Edition - Clinical stage from 10/19/2016: FIGO Stage IB (T1b, N0, M0) - Signed by Derwood Kaplan, MD on 09/14/2021 Staged by: Managing physician Diagnostic confirmation: Positive histology Specimen type: Excision Histopathologic type: Adenocarcinoma, endocervical type Stage prefix: Initial diagnosis Laterality: Bilateral Tumor size (mm): 40 Histologic grade (G): G3 Lymph-vascular invasion (LVI): LVI present/identified, NOS Residual tumor (R): R0 - None Peritoneal cytology results: Negative Pelvic nodal status: Negative Para-aortic status: Negative Stage used in treatment planning: Yes National guidelines used in treatment planning: Yes Type of national guideline used in treatment planning: NCCN   11/03/2016 Initial Diagnosis   Endometrial adenocarcinoma ([place])   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 Unfortunately, she is having severe leg pain despite fentanyl 12 mcg/hr with hydrocodone/APAP for breakthrough, we will increase to 25 mcg. Her right leg is more swollen and red. She denies  bleeding.  Her sister states she had a temperature of 102 the other day, which resolved with Tylenol.  Her appetite is good. Her weight has been stable.  Her sister states she canceled the appointment with wound care to manage the sacral decubitus, as palliative care services were coming to the house.  We will ask palliative care to manage this. She has occasional epistaxis.  REVIEW OF SYSTEMS:  Review of Systems  Constitutional:  Positive for fatigue and fever (Single episode without recurrence). Negative for appetite change, chills and unexpected weight change.  HENT:   Positive for trouble swallowing (improving). Negative for lump/mass, mouth sores, nosebleeds and sore throat.   Respiratory:  Positive for cough. Negative for shortness of breath.   Cardiovascular:  Negative for chest pain and leg swelling.  Gastrointestinal:  Negative for abdominal pain, blood in stool, constipation, diarrhea, nausea and vomiting.  Endocrine: Negative for hot flashes.  Genitourinary:  Negative for difficulty urinating, dysuria, frequency and hematuria.   Musculoskeletal:  Positive for back pain (Back and bilateral legs). Negative for arthralgias and myalgias.  Skin:  Positive for rash (Bilateral lower legs).  Neurological:  Negative for dizziness and headaches.  Hematological:  Negative for adenopathy. Does not bruise/bleed easily.  Psychiatric/Behavioral:  Negative for depression and sleep disturbance. The patient is not nervous/anxious.      VITALS:  Blood pressure 114/63, pulse (!) 131, temperature 97.6 F (36.4 C), resp. rate 20, height _0  (1.676 m), weight 177 lb 6.4 oz (80.5 kg), SpO2 96 %.  Wt Readings from Last 3 Encounters:  09/09/22 177 lb 6.4 oz (80.5 kg)  09/02/22 179 lb (81.2 kg)  08/25/22 179 lb 4.8 oz (81.3 kg)    Body mass index is 28.63 kg/m.  Performance status (ECOG): 3 - Symptomatic, >50% confined to bed  PHYSICAL EXAM:  Physical Exam Vitals and nursing note reviewed.   Constitutional:      General: She is not in acute distress.    Appearance: Normal appearance.  HENT:     Head: Normocephalic and atraumatic.     Mouth/Throat:     Mouth: Mucous membranes are moist.     Pharynx: Oropharynx is clear. No oropharyngeal exudate or posterior oropharyngeal erythema.  Eyes:     General: No scleral icterus.    Extraocular Movements: Extraocular movements intact.     Conjunctiva/sclera: Conjunctivae normal.     Pupils: Pupils are equal, round, and reactive to light.  Cardiovascular:     Rate and Rhythm: Normal rate and regular rhythm.     Heart sounds: Normal heart sounds. No murmur heard.    No friction rub. No gallop.  Pulmonary:     Effort: Pulmonary effort is normal.     Breath sounds: Normal breath sounds. No wheezing, rhonchi or rales.  Abdominal:     General: There is no distension.     Palpations: Abdomen is soft. There is no hepatomegaly, splenomegaly or mass.     Tenderness: There is no abdominal tenderness.  Musculoskeletal:        General: Normal range of motion.     Cervical back: Normal range of motion and neck supple. No tenderness.     Right lower leg: Edema (1+) present.     Left lower leg: No edema.  Lymphadenopathy:  Cervical: No cervical adenopathy.     Upper Body:     Right upper body: No supraclavicular or axillary adenopathy.     Left upper body: No supraclavicular or axillary adenopathy.  Skin:    General: Skin is warm and dry.     Coloration: Skin is not jaundiced.     Findings: Erythema (Distal right lower extremity with overlying warmth and tenderness) present. No rash.  Neurological:     Mental Status: She is alert and oriented to person, place, and time.     Cranial Nerves: No cranial nerve deficit.  Psychiatric:        Mood and Affect: Mood normal.        Behavior: Behavior normal.        Thought Content: Thought content normal.    LABS:      Latest Ref Rng & Units 09/09/2022   12:00 AM 09/02/2022   12:00 AM  08/25/2022   12:00 AM  CBC  WBC  14.4     7.3     4.0      Hemoglobin 12.0 - 16.0 7.5     9.3     6.8      Hematocrit 36 - 46 _0 Platelets 150 - 400 K/uL _1 This result is from an external source.      Latest Ref Rng & Units 09/09/2022   12:00 AM 09/02/2022   12:00 AM 08/25/2022   12:00 AM  CMP  BUN 4 - _2 Creatinine 0.5 - 1.1 0.5     0.5     0.4      Sodium 137 - 147 129     134     129      Potassium 3.5 - 5.1 mEq/L 4.4     4.1     3.9      Chloride 99 - 108 98     98     99      CO2 13 - _3 Calcium 8.7 - 10.7 9.0     8.8     8.5      Alkaline Phos 25 - 125 123     110     116      AST 13 - 35 _4 ALT 7 - 35 U/L _5 This result is from an external source.     No results found for: "CEA1", "CEA" / No results found for: "CEA1", "CEA" No results found for: "PSA1" No results found for: "CAN199" Lab Results  Component Value Date   CAN125 7.2 12/10/2021    Lab Results  Component Value Date   TOTALPROTELP 7.8 06/14/2022   ALBUMINELP 2.5 (L) 06/14/2022   A1GS 0.5 (H) 06/14/2022   A2GS 1.3 (H) 06/14/2022   BETS 1.0 06/14/2022   GAMS 2.4 (H) 06/14/2022   MSPIKE Not Observed 06/14/2022   SPEI Comment 06/14/2022   Lab Results  Component Value Date  TIBC 227 (L) 06/10/2022   FERRITIN 762 (H) 06/10/2022   IRONPCTSAT 12 06/10/2022   Lab Results  Component Value Date   LDH 192 06/09/2022    STUDIES:  No results found.    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[place])    History of radiation therapy 03/02/17-03/16/17   vaginal cuff treated to 18 Gy with 3 fractions of 6 Gy   Hyperglycemia    Labile blood pressure    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[place]) 09/19/2020   Morbid obesity ([place])    Multiple lung nodules on CT 03/14/2018   CT 11/15/16  MPN's largest 6 mm - PET 12/15/16 neg but largest < 58m - CT chest 01/31/18  Largest ?RML proximal/central  @ 1.8 x 1.5 and LLL peripheral cavitary x  1.5 x1.4  - PET 03/22/18 :  Two distinct nodules, def growing since 01/31/18 both hypermetabolic    Phlebitis alone left leg  4-5 yrs ago   Pulmonary embolism on right (HCC)    Redness and swelling of lower leg    Secondary malignant neoplasm of lung ([place] 07/18/2019   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) ([facility] 01/18/2018    Past Surgical History:  Procedure Laterality Date   APPLICATION OF CRANIAL NAVIGATION N/A 10/08/2020   Procedure: APPLICATION OF CRANIAL NAVIGATION;  Surgeon: TVallarie Mare MD;  [facility]  Service: Neurosurgery;  Laterality: N/A;   CRANIOTOMY N/A 10/08/2020   Procedure: CRANIOTOMY - Suboccipital TUMOR EXCISION - Brain Lab;  Surgeon: TVallarie Mare MD;  Location: [facility]  Service: Neurosurgery;  Laterality: N/A;  suboccipital   ROBOTIC ASSISTED TOTAL HYSTERECTOMY WITH BILATERAL SALPINGO OOPHERECTOMY N/A 11/18/2016   Procedure: XI ROBOTIC ASSISTED TOTAL HYSTERECTOMY WITH BILATERAL SALPINGO OOPHORECTOMY;  Surgeon: EEveritt Amber MD;  Location: [facility];  Service: Gynecology;  Laterality: N/A;   SENTINEL NODE BIOPSY N/A 11/18/2016   Procedure: SENTINEL NODE BIOPSY;  Surgeon: EEveritt Amber MD;  Location: [facility];  Service: Gynecology;  Laterality: N/A;    Family History  Problem Relation Age of Onset   Atrial fibrillation Mother    Lung cancer Father    Breast  cancer Sister    Heart failure Sister     Social History:  reports that she has never smoked. She has never used smokeless tobacco. She reports that she does not drink alcohol and does not use drugs.The patient is accompanied by her sister Brianna Herring today.  Allergies: No Known Allergies  Current Medications: Current Outpatient Medications  Medication Sig Dispense Refill   acetaminophen (TYLENOL) 500 MG tablet Take 500 mg by mouth every 6 (six) hours as needed for pain or headache.     amiodarone (PACERONE) 200 MG tablet Take 1 tablet (200 mg total) by mouth daily. 90 tablet 3   Calcium Carbonate (CALTRATE 600 PO) Take 1 tablet by mouth in the morning and at bedtime.     cephALEXin (  KEFLEX) 500 MG capsule Take 1 capsule (500 mg total) by mouth 2 (two) times daily. 20 capsule 0   fentaNYL (DURAGESIC) 25 MCG/HR Place 1 patch onto the skin every 3 (three) days. 5 patch 0   LORazepam (ATIVAN) 0.5 MG tablet Take 1 tablet (0.5 mg total) by mouth every 6 (six) hours as needed for anxiety. 30 tablet 0   metoprolol succinate (TOPROL-XL) 25 MG 24 hr tablet Take 1 tablet (25 mg total) by mouth 2 (two) times daily. 180 tablet 3   morphine (ROXANOL) 20 MG/ML concentrated solution Take 0.5 mLs (10 mg total) by mouth every 4 (four) hours as needed for severe pain. 30 mL 0   Multiple Vitamin (MULTIVITAMIN WITH MINERALS) TABS tablet Take 1 tablet by mouth daily. Centrum Silver     ondansetron (ZOFRAN) 4 MG tablet Take 1 tablet (4 mg total) by mouth every 4 (four) hours as needed for nausea. 90 tablet 3   potassium chloride 20 MEQ/15ML (10%) SOLN Take 20 mEq by mouth daily.     Probiotic Product (PROBIOTIC PO) Take 1 capsule by mouth daily.     promethazine-dextromethorphan (PROMETHAZINE-DM) 6.25-15 MG/5ML syrup Take 5 mLs by mouth 4 (four) times daily as needed for cough. 118 mL 0   No current facility-administered medications for this visit.

## 2022-09-09 NOTE — Progress Notes (Signed)
CRITICAL VALUE STICKER  CRITICAL VALUE:   Plt 13, Hgb 7.5  RECEIVER (on-site recipient of call):  Corynn Solberg RN   [place] NOTIFIED:   09/09/2022 @ 1604  MESSENGER (representative from lab):  Sharyn Lull  MD NOTIFIED:   Dr. Hinton Rao  TIME OF NOTIFICATION:  1605  RESPONSE:    Schedule at [facility] for transfusion on 09/10/2022

## 2022-09-10 ENCOUNTER — Inpatient Hospital Stay: Payer: Medicare Other

## 2022-09-10 DIAGNOSIS — D469 Myelodysplastic syndrome, unspecified: Secondary | ICD-10-CM

## 2022-09-10 MED ORDER — SODIUM CHLORIDE 0.9% IV SOLUTION
250.0000 mL | Freq: Once | INTRAVENOUS | Status: AC
  Administered 2022-09-10: 250 mL via INTRAVENOUS

## 2022-09-10 MED ORDER — HEPARIN SOD (PORK) LOCK FLUSH 100 UNIT/ML IV SOLN
250.0000 [IU] | INTRAVENOUS | Status: AC | PRN
  Administered 2022-09-10: 250 [IU]

## 2022-09-10 MED ORDER — SODIUM CHLORIDE 0.9% FLUSH: 3.0000 mL | INTRAVENOUS | Status: DC | PRN

## 2022-09-10 MED ORDER — SODIUM CHLORIDE 0.9% FLUSH
10.0000 mL | INTRAVENOUS | Status: AC | PRN
  Administered 2022-09-10: 10 mL

## 2022-09-10 MED ORDER — FUROSEMIDE 10 MG/ML IJ SOLN
20.0000 mg | Freq: Once | INTRAMUSCULAR | Status: AC
  Administered 2022-09-10: 20 mg via INTRAVENOUS
  Filled 2022-09-10: qty 2

## 2022-09-10 MED ORDER — DIPHENHYDRAMINE HCL 25 MG PO CAPS
25.0000 mg | ORAL_CAPSULE | Freq: Once | ORAL | Status: AC
  Administered 2022-09-10: 25 mg via ORAL
  Filled 2022-09-10: qty 1

## 2022-09-10 MED ORDER — ACETAMINOPHEN 325 MG PO TABS
650.0000 mg | ORAL_TABLET | Freq: Once | ORAL | Status: AC
  Administered 2022-09-10: 650 mg via ORAL
  Filled 2022-09-10: qty 2

## 2022-09-10 NOTE — Patient Instructions (Signed)
Platelet Transfusion A platelet transfusion is a procedure in which a person receives donated platelets through an IV. Platelets are parts of blood that stick together and form a clot to help the body stop bleeding after an injury. If you have too few platelets, your blood may have trouble clotting. This may cause you to bleed and bruise very easily. You may need a platelet transfusion if you have a condition that causes a low number of platelets (thrombocytopenia). A platelet transfusion may be used to stop or prevent excessive bleeding. Tell a health care provider about: Any reactions you have had during previous transfusions. Any allergies you have. All medicines you are taking, including vitamins, herbs, eye drops, creams, and over-the-counter medicines. Any bleeding problems you have. Any surgeries you have had. Any medical conditions you have. Whether you are pregnant or may be pregnant. What are the risks? Generally, this is a safe procedure. However, problems may occur, including: Fever. Infection. Allergic reaction to the donated (donor) platelets. Your body's disease-fighting system (immune system) attacking the donor platelets (hemolytic reaction). This is rare. A rare reaction that causes lung damage (transfusion-related acute lung injury). What happens before the procedure? Medicines Ask your health care provider about: Changing or stopping your regular medicines. This is especially important if you are taking diabetes medicines or blood thinners. Taking medicines such as aspirin and ibuprofen. These medicines can thin your blood. Do not take these medicines unless your health care provider tells you to take them. Taking over-the-counter medicines, vitamins, herbs, and supplements. General instructions You will have a blood test to determine your blood type. Your blood type determines what kind of platelets you will be given. Follow instructions from your health care provider  about eating or drinking restrictions. If you have had an allergic reaction to a transfusion in the past, you may be given medicine to help prevent a reaction. Your temperature, blood pressure, pulse, and breathing will be monitored. What happens during the procedure?  An IV will be inserted into one of your veins. For your safety, two health care providers will verify your identity along with the donor platelets about to be infused. A bag of donor platelets will be connected to your IV. The platelets will flow into your bloodstream. This usually takes 30-60 minutes. Your temperature, blood pressure, pulse, and breathing will be monitored during the transfusion. This helps detect early signs of any reaction. You will also be monitored for other symptoms that may indicate a reaction, including chills, hives, or itching. If you have signs of a reaction at any time, your transfusion will be stopped, and you may be given medicine to help manage the reaction. When your transfusion is complete, your IV will be removed. Pressure may be applied to the IV site for a few minutes to stop any bleeding. The IV site will be covered with a bandage (dressing). The procedure may vary among health care providers and hospitals. What can I expect after the procedure? Your blood pressure, temperature, pulse, and breathing will be monitored until you leave the hospital or clinic. You may have some bruising and soreness at your IV site. Follow these instructions at home: Medicines Take over-the-counter and prescription medicines only as told by your health care provider. Talk with your health care provider before you take any medicines that contain aspirin or NSAIDs, such as ibuprofen. These medicines increase your risk for dangerous bleeding. IV site care Check your IV site every day for signs of infection. Check for:   Redness, swelling, or pain. Fluid or blood. If fluid or blood drains from your IV site, use your  hands to press down firmly on a bandage covering the area for a minute or two. Doing this should stop the bleeding. Warmth. Pus or a bad smell. General instructions Change or remove your dressing as told by your health care provider. Return to your normal activities as told by your health care provider. Ask your health care provider what activities are safe for you. Do not take baths, swim, or use a hot tub until your health care provider approves. Ask your health care provider if you may take showers. Keep all follow-up visits. This is important. Contact a health care provider if: You have a headache that does not go away with medicine. You have hives, rash, or itchy skin. You have nausea or vomiting. You feel unusually tired or weak. You have signs of infection at your IV site. Get help right away if: You have a fever or chills. You urinate less often than usual. Your urine is darker colored than normal. You have any of the following: Trouble breathing. Pain in your back, abdomen, or chest. Cool, clammy skin. A fast heartbeat. Summary Platelets are tiny pieces of blood cells that clump together to form a blood clot when you have an injury. If you have too few platelets, your blood may have trouble clotting. A platelet transfusion is a procedure in which you receive donated platelets through an IV. A platelet transfusion may be used to stop or prevent excessive bleeding. After the procedure, check your IV site every day for signs of infection. This information is not intended to replace advice given to you by your health care provider. Make sure you discuss any questions you have with your health care provider. Document Revised: 04/30/2021 Document Reviewed: 04/30/2021 Elsevier Patient Education  [place]. Blood Transfusion, Adult, Care After After a blood transfusion, it is common to have: Bruising and soreness at the IV site. A headache. Follow these instructions at  home: Your doctor may give you more instructions. If you have problems, contact your doctor. Insertion site care     Follow instructions from your doctor about how to take care of your insertion site. This is where an IV tube was put into your vein. Make sure you: Wash your hands with soap and water for at least 20 seconds before and after you change your bandage. If you cannot use soap and water, use hand sanitizer. Change your bandage as told by your doctor. Check your insertion site every day for signs of infection. Check for: Redness, swelling, or pain. Bleeding from the site. Warmth. Pus or a bad smell. General instructions Take over-the-counter and prescription medicines only as told by your doctor. Rest as told by your doctor. Go back to your normal activities as told by your doctor. Keep all follow-up visits. You may need to have tests at certain times to check your blood. Contact a doctor if: You have itching or red, swollen areas of skin (hives). You have a fever or chills. You have pain in the head, back, or chest. You feel worried or nervous (anxious). You feel weak after doing your normal activities. You have any of these problems at the insertion site: Redness, swelling, warmth, or pain. Bleeding that does not stop with pressure. Pus or a bad smell. If you received your blood transfusion in an outpatient setting, you will be told whom to contact to report any reactions.  Get help right away if: You have signs of a serious reaction. This may be coming from an allergy or the body's defense system (immune system). Signs include: Trouble breathing or shortness of breath. Swelling of the face or feeling warm (flushed). A widespread rash. Dark pee (urine) or blood in the pee. Fast heartbeat. These symptoms may be an emergency. Get help right away. Call 911. Do not wait to see if the symptoms will go away. Do not drive yourself to the hospital. Summary Bruising and  soreness at the IV site are common. Check your insertion site every day for signs of infection. Rest as told by your doctor. Go back to your normal activities as told by your doctor. Get help right away if you have signs of a serious reaction. This information is not intended to replace advice given to you by your health care provider. Make sure you discuss any questions you have with your health care provider. Document Revised: 01/22/2022 Document Reviewed: 01/22/2022 Elsevier Patient Education  [place].

## 2022-09-11 LAB — TYPE AND SCREEN
ABO/RH(D): O POS
Antibody Screen: NEGATIVE
Unit division: 0

## 2022-09-11 LAB — PREPARE PLATELET PHERESIS: Unit division: 0

## 2022-09-11 LAB — BPAM RBC
Blood Product Expiration Date: 202312042359
ISSUE DATE / TIME: 202311031037
Unit Type and Rh: 5100

## 2022-09-11 LAB — BPAM PLATELET PHERESIS
Blood Product Expiration Date: 202311032359
ISSUE DATE / TIME: 202311031044
Unit Type and Rh: 6200

## 2022-09-13 ENCOUNTER — Telehealth: Payer: Self-pay

## 2022-09-13 NOTE — Telephone Encounter (Addendum)
Pt died 2022-09-29 @ 1143 @ home.  ----- Message from Derwood Kaplan, MD sent at 09/13/2022  9:42 AM EST ----- Regarding: RE: DECEASED Yes, had several calls over weekend, called in hospice and got oxygen out there. She was already on palliative care but when hospice went out Sunday to sign her up, she had expired. Don't know time. ----- Message ----- From: Dairl Ponder, RN Sent: 09/13/2022   8:33 AM EST To: Derwood Kaplan, MD; Juanetta Beets, [facility]; # Subject: DECEASED                                       Pt died yesterday per Ava.

## 2022-09-14 ENCOUNTER — Other Ambulatory Visit: Payer: Medicare Other

## 2022-09-14 ENCOUNTER — Ambulatory Visit: Payer: Medicare Other | Admitting: Oncology

## 2022-09-17 ENCOUNTER — Telehealth: Payer: Medicare Other | Admitting: Cardiology

## 2022-09-23 ENCOUNTER — Ambulatory Visit: Payer: Medicare Other | Admitting: Hematology and Oncology

## 2022-09-23 ENCOUNTER — Other Ambulatory Visit: Payer: Medicare Other

## 2022-10-06 ENCOUNTER — Ambulatory Visit: Payer: Medicare Other | Admitting: Oncology

## 2022-10-06 ENCOUNTER — Other Ambulatory Visit: Payer: Medicare Other

## 2022-10-08 ENCOUNTER — Other Ambulatory Visit (HOSPITAL_COMMUNITY): Payer: Medicare Other

## 2022-10-08 DEATH — deceased

## 2022-10-12 ENCOUNTER — Ambulatory Visit: Payer: Medicare Other | Admitting: Internal Medicine

## 2023-01-04 ENCOUNTER — Ambulatory Visit: Payer: Medicare Other | Admitting: Cardiology
